# Patient Record
Sex: Male | Born: 1985 | Hispanic: No | Marital: Single | Smoking: Never smoker
Health system: Southern US, Community
[De-identification: ages and names within clinical notes are randomized; demographics above are authoritative.]

## PROBLEM LIST (undated history)

## (undated) DIAGNOSIS — D649 Anemia, unspecified: Secondary | ICD-10-CM

## (undated) DIAGNOSIS — E8770 Fluid overload, unspecified: Secondary | ICD-10-CM

## (undated) DIAGNOSIS — K7460 Unspecified cirrhosis of liver: Secondary | ICD-10-CM

## (undated) DIAGNOSIS — R0600 Dyspnea, unspecified: Secondary | ICD-10-CM

## (undated) DIAGNOSIS — E871 Hypo-osmolality and hyponatremia: Secondary | ICD-10-CM

## (undated) HISTORY — PX: PARACENTESIS: SHX844

## (undated) HISTORY — PX: NO PAST SURGERIES: SHX2092

---

## 2020-01-26 ENCOUNTER — Emergency Department: Payer: Medicaid - Out of State

## 2020-01-26 ENCOUNTER — Emergency Department
Admission: EM | Admit: 2020-01-26 | Discharge: 2020-01-26 | Disposition: A | Discharge status: Home / Self Care | Payer: Medicaid - Out of State | Attending: Emergency Medicine | Admitting: Emergency Medicine

## 2020-01-26 ENCOUNTER — Other Ambulatory Visit: Payer: Self-pay

## 2020-01-26 DIAGNOSIS — R188 Other ascites: Secondary | ICD-10-CM | POA: Diagnosis not present

## 2020-01-26 DIAGNOSIS — Z20822 Contact with and (suspected) exposure to covid-19: Secondary | ICD-10-CM | POA: Diagnosis not present

## 2020-01-26 DIAGNOSIS — R140 Abdominal distension (gaseous): Secondary | ICD-10-CM | POA: Diagnosis present

## 2020-01-26 DIAGNOSIS — K7031 Alcoholic cirrhosis of liver with ascites: Secondary | ICD-10-CM

## 2020-01-26 HISTORY — DX: Anemia, unspecified: D64.9

## 2020-01-26 HISTORY — DX: Fluid overload, unspecified: E87.70

## 2020-01-26 HISTORY — DX: Hypo-osmolality and hyponatremia: E87.1

## 2020-01-26 HISTORY — DX: Unspecified cirrhosis of liver: K74.60

## 2020-01-26 LAB — PROTIME-INR
INR: 2.2 — ABNORMAL HIGH (ref 0.8–1.2)
Prothrombin Time: 23.9 seconds — ABNORMAL HIGH (ref 11.4–15.2)

## 2020-01-26 LAB — COMPREHENSIVE METABOLIC PANEL
ALT: 62 U/L — ABNORMAL HIGH (ref 0–44)
AST: 97 U/L — ABNORMAL HIGH (ref 15–41)
Albumin: 2.6 g/dL — ABNORMAL LOW (ref 3.5–5.0)
Alkaline Phosphatase: 97 U/L (ref 38–126)
Anion gap: 10 (ref 5–15)
BUN: 18 mg/dL (ref 6–20)
CO2: 20 mmol/L — ABNORMAL LOW (ref 22–32)
Calcium: 8.5 mg/dL — ABNORMAL LOW (ref 8.9–10.3)
Chloride: 98 mmol/L (ref 98–111)
Creatinine, Ser: 0.65 mg/dL (ref 0.61–1.24)
GFR calc Af Amer: >60 mL/min (ref >60)
GFR calc non Af Amer: >60 mL/min (ref >60)
Glucose, Bld: 116 mg/dL — ABNORMAL HIGH (ref 70–99)
Potassium: 4.1 mmol/L (ref 3.5–5.1)
Sodium: 128 mmol/L — ABNORMAL LOW (ref 135–145)
Total Bilirubin: 19.2 mg/dL (ref 0.3–1.2)
Total Protein: 7.9 g/dL (ref 6.5–8.1)

## 2020-01-26 LAB — APTT: aPTT: 40 seconds — ABNORMAL HIGH (ref 24–36)

## 2020-01-26 LAB — CBC
HCT: 28.2 % — ABNORMAL LOW (ref 39.0–52.0)
Hemoglobin: 10.2 g/dL — ABNORMAL LOW (ref 13.0–17.0)
MCH: 36.2 pg — ABNORMAL HIGH (ref 26.0–34.0)
MCHC: 36.2 g/dL — ABNORMAL HIGH (ref 30.0–36.0)
MCV: 100 fL (ref 80.0–100.0)
Platelets: 129 10*3/uL — ABNORMAL LOW (ref 150–400)
RBC: 2.82 MIL/uL — ABNORMAL LOW (ref 4.22–5.81)
RDW: 14.3 % (ref 11.5–15.5)
WBC: 11.7 10*3/uL — ABNORMAL HIGH (ref 4.0–10.5)
nRBC: 0 % (ref 0.0–0.2)

## 2020-01-26 LAB — LIPASE, BLOOD: Lipase: 44 U/L (ref 11–51)

## 2020-01-26 LAB — SARS CORONAVIRUS 2 BY RT PCR (HOSPITAL ORDER, PERFORMED IN ~~LOC~~ HOSPITAL LAB): SARS Coronavirus 2: NEGATIVE

## 2020-01-26 NOTE — ED Notes (Signed)
Pt states he just needs his abd drained, states he has had to have it done twice before

## 2020-01-26 NOTE — Procedures (Signed)
Ultrasound-guided therapeutic paracentesis performed yielding 5.6 liters of straw  colored fluid.  No immediate complications. EBL is < 2 ml.

## 2020-01-26 NOTE — ED Triage Notes (Signed)
Pt comes into the ED via EMS from home with c/o abd distention with hx of liver failure. Pt is in NAD. 138/78, 82HRm 97%RA

## 2020-01-26 NOTE — ED Provider Notes (Signed)
[facility] Emergency Department Provider Note   ____________________________________________   First MD Initiated Contact with Patient 01/26/20 1417     (approximate)  I have reviewed the triage vital signs and the nursing notes.   HISTORY  Chief Complaint Bloated    HPI Bruce Bell is a 34 y.o. male who comes in complaining of increasing abdominal girth shortness of breath when he walks.  He says he is as bad as he has been when he had paracentesis done twice before.  He is asking for another paracentesis.  He has alcoholic cirrhosis.  He is not having any abdominal pain.         Past Medical History:  Diagnosis Date  . Anemia   . Hypervolemia   . Hyponatremia   . Liver cirrhosis (HCC)     There are no problems to display for this patient.   Past Surgical History:  Procedure Laterality Date  . PARACENTESIS      Prior to Admission medications   Medication Sig Start Date End Date Taking? Authorizing Provider  furosemide (LASIX) 40 MG tablet Take 40 mg by mouth.   Yes [provider]  pantoprazole (PROTONIX) 40 MG tablet Take 40 mg by mouth daily.   Yes [provider]  spironolactone (ALDACTONE) 100 MG tablet Take 100 mg by mouth daily.   Yes [provider]    Allergies Patient has no known allergies.  No family history on file.  Social History Social History   Tobacco Use  . Smoking status: Never Smoker  . Smokeless tobacco: Never Used  Substance Use Topics  . Alcohol use: Not Currently  . Drug use: Not Currently    Review of Systems  Constitutional: No fever/chills Eyes: No visual changes. ENT: No sore throat. Cardiovascular: Denies chest pain. Respiratory: Denies shortness of breath. Gastrointestinal: No abdominal pain.  No nausea, no vomiting.  No diarrhea.  No constipation. Genitourinary: Negative for dysuria. Musculoskeletal: Negative for back pain. Skin: Negative for  rash. Neurological: Negative for headaches, focal weakness  ____________________________________________   PHYSICAL EXAM:  VITAL SIGNS: ED Triage Vitals  Enc Vitals Group     BP 01/26/20 1015 127/76     Pulse Rate 01/26/20 1015 (!) 108     Resp 01/26/20 1015 (!) 22     Temp 01/26/20 1015 98.1 F (36.7 C)     Temp Source 01/26/20 1015 Oral     SpO2 01/26/20 1015 98 %     Weight 01/26/20 0952 166 lb (75.3 kg)     Height 01/26/20 0952 5\' 8"  (1.727 m)     Head Circumference --      Peak Flow --      Pain Score 01/26/20 0952 6     Pain Loc --      Pain Edu? --      Excl. in GC? --     Constitutional: Alert and oriented. Well appearing and in no acute distress. Eyes: Conjunctivae are jaundiced Head: Atraumatic. Nose: No congestion/rhinnorhea. Mouth/Throat: Mucous membranes are moist.  Oropharynx non-erythematous. Neck: No stridor.  Cardiovascular: Normal rate, regular rhythm. Grossly normal heart sounds.  Good peripheral circulation. Respiratory: Normal respiratory effort.  No retractions. Lungs CTAB. Gastrointestinal: Soft and nontender.  distention. No abdominal bruits.  Musculoskeletal: No lower extremity tenderness nor edema.   Neurologic:  Normal speech and language. No gross focal neurologic deficits are appreciated. No gait instability. Skin:  Skin is warm, dry and intact. No rash noted.  ____________________________________________   LABS (all labs ordered are listed, but only abnormal results are displayed)  Labs Reviewed  COMPREHENSIVE METABOLIC PANEL - Abnormal; Notable for the following components:      Result Value   Sodium 128 (*)    CO2 20 (*)    Glucose, Bld 116 (*)    Calcium 8.5 (*)    Albumin 2.6 (*)    AST 97 (*)    ALT 62 (*)    Total Bilirubin 19.2 (*)    All other components within normal limits  CBC - Abnormal; Notable for the following components:   WBC 11.7 (*)    RBC 2.82 (*)    Hemoglobin 10.2 (*)    HCT 28.2 (*)    MCH 36.2 (*)     MCHC 36.2 (*)    Platelets 129 (*)    All other components within normal limits  PROTIME-INR - Abnormal; Notable for the following components:   Prothrombin Time 23.9 (*)    INR 2.2 (*)    All other components within normal limits  APTT - Abnormal; Notable for the following components:   aPTT 40 (*)    All other components within normal limits  SARS CORONAVIRUS 2 BY RT PCR (HOSPITAL ORDER, PERFORMED IN [facility])  LIPASE, BLOOD  URINALYSIS, COMPLETE (UACMP) WITH MICROSCOPIC   ____________________________________________  EKG   ____________________________________________  RADIOLOGY  ED MD interpretation:  Official radiology report(s): DG Chest 2 View  Result Date: 01/26/2020 CLINICAL DATA:  Shortness of breath, abdominal distension EXAM: CHEST - 2 VIEW COMPARISON:  None. FINDINGS: Large right pleural effusion with some adjacent opacity in the lung likely passive atelectasis though underlying infection/consolidation within this lung would be difficult to exclude. Left lung is clear. Visible cardiomediastinal contours are unremarkable. No acute osseous or soft tissue abnormality. IMPRESSION: Large right pleural effusion with adjacent opacity likely reflecting passive atelectasis though underlying infection/consolidation within this lung would be difficult to exclude. Electronically Signed   By: Kreg Shropshire M.D.   On: 01/26/2020 15:03    ____________________________________________   PROCEDURES  Procedure(s) performed (including Critical Care):  Procedures   ____________________________________________   INITIAL IMPRESSION / ASSESSMENT AND PLAN / ED COURSE Patient reports he has been this bad each time he gets his paracentesis. I have ordered paracentesis for him. We are waiting on some lab work.              ____________________________________________   FINAL CLINICAL IMPRESSION(S) / ED DIAGNOSES  Final diagnoses:  Ascites of liver   Alcoholic cirrhosis of liver with ascites [facility])     ED Discharge Orders    None      *Please note:  Antinio Sanderfer was evaluated in Emergency Department on 01/26/2020 for the symptoms described in the history of present illness. He was evaluated in the context of the global COVID-19 pandemic, which necessitated consideration that the patient might be at risk for infection with the SARS-CoV-2 virus that causes COVID-19. Institutional protocols and algorithms that pertain to the evaluation of patients at risk for COVID-19 are in a state of rapid change based on information released by regulatory bodies including the CDC and federal and state organizations. These policies and algorithms were followed during the patient's care in the ED.  Some ED evaluations and interventions may be delayed as a result of limited staffing during and the pandemic.*   Note:  This document was prepared using Dragon voice recognition software and may include unintentional dictation  errors.    Arnaldo Natal, MD 01/26/20 [phone]

## 2020-01-26 NOTE — Discharge Instructions (Signed)
Please continue to take your regular medications. Return to the ED with any fevers, worsening abdominal pain or tense swelling to your abdomen.

## 2020-01-26 NOTE — ED Provider Notes (Signed)
Patient received in signout from Dr. Darnelle Catalan.  Briefly, history of alcoholic cirrhosis and recurrent paracentesis for ascites accumulation, who presents with increased ascites and request for paracentesis.  IR performs ultrasound-guided paracentesis, yielding 5.6 L of straw-colored fluid.  No immediate complications.  Patient is observed for about 1 hour after this in the ED without evidence of complications.  Patient with resolution of his symptoms and feels much better, he is thankful.  His abdominal exam is benign.  Patient suitable for discharge and outpatient follow-up.  We discussed return precautions for the ED.  Patient medically stable for discharge home.   Delton Prairie, MD 01/26/20 [phone]

## 2020-02-07 ENCOUNTER — Other Ambulatory Visit: Payer: Self-pay

## 2020-02-07 ENCOUNTER — Emergency Department
Admission: EM | Admit: 2020-02-07 | Discharge: 2020-02-07 | Disposition: A | Discharge status: Home / Self Care | Payer: Medicaid - Out of State | Attending: Emergency Medicine | Admitting: Emergency Medicine

## 2020-02-07 ENCOUNTER — Emergency Department: Payer: Medicaid - Out of State

## 2020-02-07 ENCOUNTER — Encounter: Payer: Self-pay | Admitting: Emergency Medicine

## 2020-02-07 DIAGNOSIS — R109 Unspecified abdominal pain: Secondary | ICD-10-CM | POA: Insufficient documentation

## 2020-02-07 DIAGNOSIS — Z5321 Procedure and treatment not carried out due to patient leaving prior to being seen by health care provider: Secondary | ICD-10-CM | POA: Insufficient documentation

## 2020-02-07 DIAGNOSIS — R0602 Shortness of breath: Secondary | ICD-10-CM | POA: Insufficient documentation

## 2020-02-07 DIAGNOSIS — R000 Tachycardia, unspecified: Secondary | ICD-10-CM | POA: Insufficient documentation

## 2020-02-07 LAB — COMPREHENSIVE METABOLIC PANEL
ALT: 50 U/L — ABNORMAL HIGH (ref 0–44)
AST: 79 U/L — ABNORMAL HIGH (ref 15–41)
Albumin: 2.2 g/dL — ABNORMAL LOW (ref 3.5–5.0)
Alkaline Phosphatase: 210 U/L — ABNORMAL HIGH (ref 38–126)
Anion gap: 8 (ref 5–15)
BUN: 16 mg/dL (ref 6–20)
CO2: 19 mmol/L — ABNORMAL LOW (ref 22–32)
Calcium: 8.5 mg/dL — ABNORMAL LOW (ref 8.9–10.3)
Chloride: 101 mmol/L (ref 98–111)
Creatinine, Ser: 0.66 mg/dL (ref 0.61–1.24)
GFR calc Af Amer: >60 mL/min (ref >60)
GFR calc non Af Amer: >60 mL/min (ref >60)
Glucose, Bld: 128 mg/dL — ABNORMAL HIGH (ref 70–99)
Potassium: 4.6 mmol/L (ref 3.5–5.1)
Sodium: 128 mmol/L — ABNORMAL LOW (ref 135–145)
Total Bilirubin: 14 mg/dL — ABNORMAL HIGH (ref 0.3–1.2)
Total Protein: 7.4 g/dL (ref 6.5–8.1)

## 2020-02-07 LAB — CBC
HCT: 25.2 % — ABNORMAL LOW (ref 39.0–52.0)
Hemoglobin: 8.4 g/dL — ABNORMAL LOW (ref 13.0–17.0)
MCH: 36.1 pg — ABNORMAL HIGH (ref 26.0–34.0)
MCHC: 33.3 g/dL (ref 30.0–36.0)
MCV: 108.2 fL — ABNORMAL HIGH (ref 80.0–100.0)
Platelets: 174 10*3/uL (ref 150–400)
RBC: 2.33 MIL/uL — ABNORMAL LOW (ref 4.22–5.81)
RDW: 16 % — ABNORMAL HIGH (ref 11.5–15.5)
WBC: 10 10*3/uL (ref 4.0–10.5)
nRBC: 0 % (ref 0.0–0.2)

## 2020-02-07 LAB — LIPASE, BLOOD: Lipase: 80 U/L — ABNORMAL HIGH (ref 11–51)

## 2020-02-07 LAB — PROTIME-INR
INR: 2.2 — ABNORMAL HIGH (ref 0.8–1.2)
Prothrombin Time: 23.5 seconds — ABNORMAL HIGH (ref 11.4–15.2)

## 2020-02-07 NOTE — ED Triage Notes (Signed)
Pt states last Nov dx with fatty liver that developed into  Stage 1 cirrhosis . Pt states has had several paracentesis done. Pt states had restricted fluid intake to 1L/day. C/o SOB, noted distended abdomen in triage.   Pt states last paracentesis approx 3 weeks ago. Pt noted to be tachypneic and tachycardic in triage.

## 2020-02-08 ENCOUNTER — Emergency Department
Admission: EM | Admit: 2020-02-08 | Discharge: 2020-02-08 | Disposition: A | Discharge status: Home / Self Care | Payer: Medicaid - Out of State | Attending: Student in an Organized Health Care Education/Training Program | Admitting: Student in an Organized Health Care Education/Training Program

## 2020-02-08 ENCOUNTER — Emergency Department: Payer: Medicaid - Out of State

## 2020-02-08 ENCOUNTER — Encounter: Payer: Self-pay | Admitting: Emergency Medicine

## 2020-02-08 DIAGNOSIS — R109 Unspecified abdominal pain: Secondary | ICD-10-CM | POA: Diagnosis present

## 2020-02-08 DIAGNOSIS — K7031 Alcoholic cirrhosis of liver with ascites: Secondary | ICD-10-CM | POA: Insufficient documentation

## 2020-02-08 HISTORY — DX: Unspecified cirrhosis of liver: K74.60

## 2020-02-08 LAB — ALBUMIN, PLEURAL OR PERITONEAL FLUID: Albumin, Fluid: <1 g/dL

## 2020-02-08 LAB — BODY FLUID CELL COUNT WITH DIFFERENTIAL
Eos, Fluid: 0 %
Lymphs, Fluid: 36 %
Monocyte-Macrophage-Serous Fluid: 41 %
Neutrophil Count, Fluid: 23 %
Total Nucleated Cell Count, Fluid: 374 cu mm

## 2020-02-08 LAB — PATHOLOGIST SMEAR REVIEW

## 2020-02-08 MED ORDER — ALBUMIN HUMAN 25 % IV SOLN
12.5000 g | Freq: Once | INTRAVENOUS | Status: AC
  Administered 2020-02-08: 12.5 g via INTRAVENOUS
  Filled 2020-02-08: qty 50

## 2020-02-08 NOTE — ED Notes (Signed)
After speaking with SW and having lunch, pt has been discharged

## 2020-02-08 NOTE — ED Triage Notes (Signed)
Pt arrived to ED from home with c/o pressure in his abd. Pt has Stage 1 cirrhosis and states his abd needs to be drained. Pt was in ED earlier for the same but states he was tired and hungry and decided to leave and return later.

## 2020-02-08 NOTE — TOC Initial Note (Signed)
Transition of Care [facility]) - Initial/Assessment Note    Patient Details  Name: Gale Hulse MRN: [id number] Date of Birth: 29-Apr-1986  Transition of Care [facility]) CM/SW Contact:    Marina Goodell Phone Number: [phone] 02/08/2020, 3:29 PM  Clinical Narrative:     Pt arrived to ED from home with c/o pressure in his abd. Pt has Stage 1 cirrhosis and states his abd needs to be drained.  CWS spoke with patient about pending insurance.  Patient stated he is waiting for Medicaid approval in [place], recently moved from [place].  Patient stated his new [employer] plan begins tomorrow 02/09/2020.  CSW gave patient local resource list and pointed out local clinics which will take Medicaid insurance.  CSW asked patient if he had any other question or needs and he stated he was very hungry and had not eaten since last night.  CSW asked RN about patient's food tray and she stated it had been ordered but had not arrived yet. CSW asked the patient if he would like to wait for the food tray and he sated "yes".  CSW asked if the patient would like snacks and he stated "yes".  CSW went to get patient snacks.  CSW asked if he had any other needs or questions, patient stated he did not. TOC consult complete.   Barriers to Discharge: ED Medication assistance, ED Uninsured needing PCP establishment, ED Uninsured needing medication assistance   Patient Goals and CMS Choice Patient states their goals for this hospitalization and ongoing recovery are:: Find PCP      Expected Discharge Plan and Services                                                Prior Living Arrangements/Services                       Activities of Daily Living      Permission Sought/Granted                  Emotional Assessment              Admission diagnosis:  abd pain There are no problems to display for this patient.  PCP:  Patient, No Pcp Per Pharmacy:  No Pharmacies Listed    Social  Determinants of Health (SDOH) Interventions    Readmission Risk Interventions No flowsheet data found.

## 2020-02-08 NOTE — Procedures (Signed)
PROCEDURE SUMMARY:  Successful US guided paracentesis from right lateral abdomen.  Yielded 4.7 liters of clear yellow fluid.  No immediate complications.  Patient tolerated well.  EBL = trace  Specimen was sent for labs.  Darcy Barbara S Khylan Sawyer PA-C 02/08/2020 11:29 AM

## 2020-02-08 NOTE — Discharge Instructions (Addendum)
Continue to take medications as prescribed.  Please establish care with PCP and GI specialist.

## 2020-02-08 NOTE — ED Notes (Signed)
Pt taken to US

## 2020-02-08 NOTE — ED Notes (Signed)
AAOx3.  Skin warm and dry. No SOB/ DOE.  Resting in bed.  Asking for lunch.  NAD

## 2020-02-08 NOTE — ED Provider Notes (Signed)
[facility] Emergency Department Provider Note    First MD Initiated Contact with Patient 02/08/20 [phone]     (approximate)  I have reviewed the triage vital signs and the nursing notes.   HISTORY  Chief Complaint Abdominal Pain    HPI Bruce Bell is a 34 y.o. male with the below listed past medical history recently moved from [place] presents to the ER for fairly quickly recurrent abdominal ascites and swelling.  He is not particularly endorsing any pain but feels like this did recur more quickly after recently having therapeutic paracentesis done last week.  States he did miss a few days of his medications including diuretics but has gotten those refills submitted he is currently taking his medications.  He is not drinking alcohol.  No fevers.  States he is on prophylactic antibiotics.  Denies any bleeding or melena.  No hematochezia.  States he needs a referral to PCP and establish care with GI.    Past Medical History:  Diagnosis Date  . Anemia   . Cirrhosis (HCC)   . Hypervolemia   . Hyponatremia   . Liver cirrhosis (HCC)    History reviewed. No pertinent family history. Past Surgical History:  Procedure Laterality Date  . PARACENTESIS     There are no problems to display for this patient.     Prior to Admission medications   Medication Sig Start Date End Date Taking? Authorizing Provider  furosemide (LASIX) 40 MG tablet Take 40 mg by mouth.    [provider]  pantoprazole (PROTONIX) 40 MG tablet Take 40 mg by mouth daily.    [provider]  spironolactone (ALDACTONE) 100 MG tablet Take 100 mg by mouth daily.    [provider]    Allergies Patient has no known allergies.    Social History Social History   Tobacco Use  . Smoking status: Never Smoker  . Smokeless tobacco: Never Used  Vaping Use  . Vaping Use: Never used  Substance Use Topics  . Alcohol use: Not Currently  . Drug use: Not Currently      Review of Systems Patient denies headaches, rhinorrhea, blurry vision, numbness, shortness of breath, chest pain, edema, cough, abdominal pain, nausea, vomiting, diarrhea, dysuria, fevers, rashes or hallucinations unless otherwise stated above in HPI. ____________________________________________   PHYSICAL EXAM:  VITAL SIGNS: Vitals:   02/08/20 1035 02/08/20 1219  BP: (!) 120/48 (!) 115/58  Pulse: (!) 111 (!) 108  Resp: 20   Temp:    SpO2: 98% 100%    Constitutional: Alert and oriented.  Eyes: Conjunctivae are normal. Scleral icterus Head: Atraumatic. Nose: No congestion/rhinnorhea. Mouth/Throat: Mucous membranes are moist.   Neck: No stridor. Painless ROM.  Cardiovascular: Normal rate, regular rhythm. Grossly normal heart sounds.  Good peripheral circulation. Respiratory: Normal respiratory effort.  No retractions. Lungs CTAB. Gastrointestinal: Soft and nontender. + distension with fluid wave. No abdominal bruits. No CVA tenderness. Genitourinary:  Musculoskeletal: No lower extremity tenderness nor edema.  No joint effusions. Neurologic:  Normal speech and language. No gross focal neurologic deficits are appreciated. No facial droop Skin:  Skin is warm, dry and intact. No rash noted. Psychiatric: Mood and affect are normal. Speech and behavior are normal.  ____________________________________________   LABS (all labs ordered are listed, but only abnormal results are displayed)  Results for orders placed or performed during the hospital encounter of 02/08/20 (from the past 24 hour(s))  Body fluid cell count with differential  Status: Abnormal   Collection Time: 02/08/20 10:20 AM  Result Value Ref Range   Fluid Type-FCT PERITONEAL    Color, Fluid YELLOW (A) YELLOW   Appearance, Fluid HAZY (A) CLEAR   Total Nucleated Cell Count, Fluid 374 cu mm   Neutrophil Count, Fluid 23 %   Lymphs, Fluid 36 %   Monocyte-Macrophage-Serous Fluid 41 %   Eos, Fluid 0 %  Albumin,  pleural or peritoneal fluid     Status: None   Collection Time: 02/08/20 10:20 AM  Result Value Ref Range   Albumin, Fluid <1.0 g/dL   Fluid Type-FALB CYTO PERI   Pathologist smear review     Status: None   Collection Time: 02/08/20 10:20 AM  Result Value Ref Range   Path Review Cytospin of abdominal fluid is reviewed.    ____________________________________________  EKG My review and personal interpretation at Time: 12:14   Indication: abd distension  Rate: 125  Rhythm: sinus Axis: normal Other: motion artifact, abnml ekg ____________________________________________  RADIOLOGY  I personally reviewed all radiographic images ordered to evaluate for the above acute complaints and reviewed radiology reports and findings.  These findings were personally discussed with the patient.  Please see medical record for radiology report.  ____________________________________________   PROCEDURES  Procedure(s) performed:  Procedures    Critical Care performed: no ____________________________________________   INITIAL IMPRESSION / ASSESSMENT AND PLAN / ED COURSE  Pertinent labs & imaging results that were available during my care of the patient were reviewed by me and considered in my medical decision making (see chart for details).   DDX: Recurrent ascites, decompensated cirrhosis, SBP, medication noncompliance   Bruce Bell is a 34 y.o. who presents to the ED with presentation as described above he is afebrile nontoxic-appearing exam is described above.  Does have significant distention of the abdomen given its recurrence raise possibility of SBP given the patient's not febrile no significant white count as compared to previous.  Blood work actually appears improved from previous.  Will order diagnostic and therapeutic paracentesis.  Will consult social work as the patient is to be established with a local provider.  Clinical Course as of Feb 07 1450  Thu Feb 08, 2020  1129 Patient  returned back from paracentesis nontoxic-appearing the put off over 4 L therefore will give dose of albumin.   [PR]  1310 Cell count is reassuring not consistent with SBP.  Blood work appears stable.  Referral made for social worker to help arrange outpatient follow-up.  No indication for hospitalization at this time.   [PR]    Clinical Course User Index [PR] Willy Eddy, MD    The patient was evaluated in Emergency Department today for the symptoms described in the history of present illness. He/she was evaluated in the context of the global COVID-19 pandemic, which necessitated consideration that the patient might be at risk for infection with the SARS-CoV-2 virus that causes COVID-19. Institutional protocols and algorithms that pertain to the evaluation of patients at risk for COVID-19 are in a state of rapid change based on information released by regulatory bodies including the CDC and federal and state organizations. These policies and algorithms were followed during the patient's care in the ED.  As part of my medical decision making, I reviewed the following data within the electronic MEDICAL RECORD NUMBER Nursing notes reviewed and incorporated, Labs reviewed, notes from prior ED visits and [place] Controlled Substance Database   ____________________________________________   FINAL CLINICAL IMPRESSION(S) / ED DIAGNOSES  Final diagnoses:  Ascites due to alcoholic cirrhosis (HCC)      NEW MEDICATIONS STARTED DURING THIS VISIT:  Discharge Medication List as of 02/08/2020  1:27 PM       Note:  This document was prepared using Dragon voice recognition software and may include unintentional dictation errors.    Willy Eddy, MD 02/08/20 1451

## 2020-02-08 NOTE — ED Notes (Signed)
Pt returned from U/S via stretcher. 

## 2020-02-12 LAB — BODY FLUID CULTURE: Culture: NO GROWTH

## 2020-02-13 LAB — BODY FLUID CULTURE: Culture: NO GROWTH

## 2020-02-19 ENCOUNTER — Encounter (HOSPITAL_BASED_OUTPATIENT_CLINIC_OR_DEPARTMENT_OTHER): Payer: Self-pay

## 2020-02-19 ENCOUNTER — Inpatient Hospital Stay (HOSPITAL_BASED_OUTPATIENT_CLINIC_OR_DEPARTMENT_OTHER)
Admission: EM | Admit: 2020-02-19 | Discharge: 2020-02-21 | DRG: 432 | Disposition: A | Discharge status: Home / Self Care | Payer: Managed Care, Other (non HMO) | Attending: Internal Medicine | Admitting: Internal Medicine

## 2020-02-19 ENCOUNTER — Encounter: Payer: Self-pay | Admitting: *Deleted

## 2020-02-19 ENCOUNTER — Ambulatory Visit: Payer: Self-pay

## 2020-02-19 ENCOUNTER — Other Ambulatory Visit: Payer: Self-pay

## 2020-02-19 ENCOUNTER — Ambulatory Visit
Admission: EM | Admit: 2020-02-19 | Discharge: 2020-02-19 | Disposition: A | Discharge status: Home / Self Care | Payer: Managed Care, Other (non HMO)

## 2020-02-19 ENCOUNTER — Ambulatory Visit (HOSPITAL_COMMUNITY): Payer: Self-pay

## 2020-02-19 DIAGNOSIS — Z8379 Family history of other diseases of the digestive system: Secondary | ICD-10-CM

## 2020-02-19 DIAGNOSIS — F1021 Alcohol dependence, in remission: Secondary | ICD-10-CM | POA: Diagnosis present

## 2020-02-19 DIAGNOSIS — R0602 Shortness of breath: Secondary | ICD-10-CM

## 2020-02-19 DIAGNOSIS — E871 Hypo-osmolality and hyponatremia: Secondary | ICD-10-CM | POA: Diagnosis not present

## 2020-02-19 DIAGNOSIS — D6959 Other secondary thrombocytopenia: Secondary | ICD-10-CM | POA: Diagnosis present

## 2020-02-19 DIAGNOSIS — K7031 Alcoholic cirrhosis of liver with ascites: Secondary | ICD-10-CM | POA: Diagnosis not present

## 2020-02-19 DIAGNOSIS — R188 Other ascites: Secondary | ICD-10-CM

## 2020-02-19 DIAGNOSIS — R110 Nausea: Secondary | ICD-10-CM

## 2020-02-19 DIAGNOSIS — E8770 Fluid overload, unspecified: Secondary | ICD-10-CM | POA: Diagnosis present

## 2020-02-19 DIAGNOSIS — D689 Coagulation defect, unspecified: Secondary | ICD-10-CM | POA: Diagnosis present

## 2020-02-19 DIAGNOSIS — K7030 Alcoholic cirrhosis of liver without ascites: Secondary | ICD-10-CM

## 2020-02-19 DIAGNOSIS — K7460 Unspecified cirrhosis of liver: Secondary | ICD-10-CM | POA: Diagnosis present

## 2020-02-19 DIAGNOSIS — R5383 Other fatigue: Secondary | ICD-10-CM

## 2020-02-19 DIAGNOSIS — R17 Unspecified jaundice: Secondary | ICD-10-CM

## 2020-02-19 DIAGNOSIS — K7290 Hepatic failure, unspecified without coma: Secondary | ICD-10-CM | POA: Diagnosis present

## 2020-02-19 DIAGNOSIS — I9581 Postprocedural hypotension: Secondary | ICD-10-CM | POA: Diagnosis not present

## 2020-02-19 DIAGNOSIS — Z79899 Other long term (current) drug therapy: Secondary | ICD-10-CM

## 2020-02-19 DIAGNOSIS — R42 Dizziness and giddiness: Secondary | ICD-10-CM | POA: Diagnosis not present

## 2020-02-19 DIAGNOSIS — D684 Acquired coagulation factor deficiency: Secondary | ICD-10-CM | POA: Diagnosis present

## 2020-02-19 DIAGNOSIS — Z23 Encounter for immunization: Secondary | ICD-10-CM

## 2020-02-19 DIAGNOSIS — D649 Anemia, unspecified: Secondary | ICD-10-CM | POA: Diagnosis present

## 2020-02-19 DIAGNOSIS — Z20822 Contact with and (suspected) exposure to covid-19: Secondary | ICD-10-CM | POA: Diagnosis present

## 2020-02-19 DIAGNOSIS — K7200 Acute and subacute hepatic failure without coma: Secondary | ICD-10-CM | POA: Diagnosis present

## 2020-02-19 DIAGNOSIS — J90 Pleural effusion, not elsewhere classified: Secondary | ICD-10-CM | POA: Diagnosis present

## 2020-02-19 DIAGNOSIS — R140 Abdominal distension (gaseous): Secondary | ICD-10-CM | POA: Diagnosis present

## 2020-02-19 DIAGNOSIS — K7469 Other cirrhosis of liver: Secondary | ICD-10-CM | POA: Diagnosis present

## 2020-02-19 LAB — COMPREHENSIVE METABOLIC PANEL
ALT: 55 U/L — ABNORMAL HIGH (ref 0–44)
AST: 90 U/L — ABNORMAL HIGH (ref 15–41)
Albumin: 2.3 g/dL — ABNORMAL LOW (ref 3.5–5.0)
Alkaline Phosphatase: 148 U/L — ABNORMAL HIGH (ref 38–126)
Anion gap: 8 (ref 5–15)
BUN: 37 mg/dL — ABNORMAL HIGH (ref 6–20)
CO2: 18 mmol/L — ABNORMAL LOW (ref 22–32)
Calcium: 8.2 mg/dL — ABNORMAL LOW (ref 8.9–10.3)
Chloride: 96 mmol/L — ABNORMAL LOW (ref 98–111)
Creatinine, Ser: 1.21 mg/dL (ref 0.61–1.24)
GFR, Estimated: >60 mL/min (ref >60)
Glucose, Bld: 124 mg/dL — ABNORMAL HIGH (ref 70–99)
Potassium: 4.6 mmol/L (ref 3.5–5.1)
Sodium: 122 mmol/L — ABNORMAL LOW (ref 135–145)
Total Bilirubin: 12.6 mg/dL — ABNORMAL HIGH (ref 0.3–1.2)
Total Protein: 7 g/dL (ref 6.5–8.1)

## 2020-02-19 LAB — URINALYSIS, ROUTINE W REFLEX MICROSCOPIC
Glucose, UA: NEGATIVE mg/dL
Ketones, ur: NEGATIVE mg/dL
Nitrite: NEGATIVE
Protein, ur: NEGATIVE mg/dL
Specific Gravity, Urine: 1.02 (ref 1.005–1.030)
pH: 5.5 (ref 5.0–8.0)

## 2020-02-19 LAB — URINALYSIS, MICROSCOPIC (REFLEX): WBC, UA: >50 WBC/hpf (ref 0–5)

## 2020-02-19 LAB — CBC
HCT: 25.8 % — ABNORMAL LOW (ref 39.0–52.0)
Hemoglobin: 8.6 g/dL — ABNORMAL LOW (ref 13.0–17.0)
MCH: 35.7 pg — ABNORMAL HIGH (ref 26.0–34.0)
MCHC: 33.3 g/dL (ref 30.0–36.0)
MCV: 107.1 fL — ABNORMAL HIGH (ref 80.0–100.0)
Platelets: 130 10*3/uL — ABNORMAL LOW (ref 150–400)
RBC: 2.41 MIL/uL — ABNORMAL LOW (ref 4.22–5.81)
RDW: 14.8 % (ref 11.5–15.5)
WBC: 8 10*3/uL (ref 4.0–10.5)
nRBC: 0 % (ref 0.0–0.2)

## 2020-02-19 LAB — RESPIRATORY PANEL BY RT PCR (FLU A&B, COVID)
Influenza A by PCR: NEGATIVE
Influenza B by PCR: NEGATIVE
SARS Coronavirus 2 by RT PCR: NEGATIVE

## 2020-02-19 LAB — LIPASE, BLOOD: Lipase: 76 U/L — ABNORMAL HIGH (ref 11–51)

## 2020-02-19 LAB — PROTIME-INR
INR: 2.2 — ABNORMAL HIGH (ref 0.8–1.2)
Prothrombin Time: 23.8 seconds — ABNORMAL HIGH (ref 11.4–15.2)

## 2020-02-19 MED ORDER — FENTANYL CITRATE (PF) 100 MCG/2ML IJ SOLN
50.0000 ug | Freq: Once | INTRAMUSCULAR | Status: AC
  Administered 2020-02-19: 50 ug via INTRAVENOUS
  Filled 2020-02-19: qty 2

## 2020-02-19 MED ORDER — CIPROFLOXACIN HCL 500 MG PO TABS: 500.0000 mg | ORAL_TABLET | Freq: Every day | ORAL | Status: DC

## 2020-02-19 MED ORDER — FUROSEMIDE 40 MG PO TABS: 40.0000 mg | ORAL_TABLET | Freq: Every day | ORAL | Status: DC

## 2020-02-19 MED ORDER — PANTOPRAZOLE SODIUM 40 MG PO TBEC
40.0000 mg | DELAYED_RELEASE_TABLET | Freq: Every day | ORAL | Status: DC
  Administered 2020-02-20 – 2020-02-21 (×2): 40 mg via ORAL
  Filled 2020-02-19 (×2): qty 1

## 2020-02-19 MED ORDER — SPIRONOLACTONE 25 MG PO TABS: 100.0000 mg | ORAL_TABLET | Freq: Every day | ORAL | Status: DC

## 2020-02-19 NOTE — ED Triage Notes (Addendum)
Patient reports nausea and dizziness started this am--states this caused him to fall.  States that he feels fatigued and weak.  Has history of liver cirrhosis. States that upper abdomen hurts. Liver enzymes were last checked 2 weeks ago, states they were improving--paracentesis 4L removed due to shortness of breath at ED. When asked patient states he is feeling mildly short of breath but does not feel as bad as he did 2 weeks ago. Denies abdominal pain but states bloated discomfort.

## 2020-02-19 NOTE — ED Provider Notes (Signed)
[facility] EMERGENCY DEPARTMENT Provider Note   CSN: [id number] Arrival date & time: 02/19/20  1621     History Chief Complaint  Patient presents with  . Abdominal Pain  . Shortness of Breath    Bruce Bell is a 34 y.o. male.  Patient with history of cirrhosis who presents to the ED with abdominal distention, intermittent shortness of breath.  Patient recently moved here about a month ago.  He was established in [place] with GI, transplant, primary care.  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  He is in the process of transferring his care locally but has been having issues with volume status recently.  Last paracentesis was about 2 weeks ago.  Patient not sure if he needs more fluid taken off.  Does not have any local GI or primary care yet.  He denies any fever or chills.  No nausea, no vomiting.  The history is provided by the patient.  Illness Severity:  Mild Onset quality:  Gradual Timing:  Constant Progression:  Worsening Chronicity:  Chronic Associated symptoms: shortness of breath   Associated symptoms: no abdominal pain, no chest pain, no cough, no ear pain, no fever, no rash, no sore throat and no vomiting        Past Medical History:  Diagnosis Date  . Anemia   . Cirrhosis (HCC)   . Hypervolemia   . Hyponatremia   . Liver cirrhosis (HCC)     There are no problems to display for this patient.   Past Surgical History:  Procedure Laterality Date  . PARACENTESIS         No family history on file.  Social History   Tobacco Use  . Smoking status: Never Smoker  . Smokeless tobacco: Never Used  Vaping Use  . Vaping Use: Never used  Substance Use Topics  . Alcohol use: Not Currently  . Drug use: Not Currently    Home Medications Prior to Admission medications   Medication Sig Start Date End Date Taking? Authorizing Provider  ciprofloxacin (CIPRO) 500 MG tablet Take 500 mg by mouth daily.    [provider]  furosemide  (LASIX) 40 MG tablet Take 40 mg by mouth.    [provider]  pantoprazole (PROTONIX) 40 MG tablet Take 40 mg by mouth daily.    [provider]  spironolactone (ALDACTONE) 100 MG tablet Take 100 mg by mouth daily.    [provider]    Allergies    Patient has no known allergies.  Review of Systems   Review of Systems  Constitutional: Negative for chills and fever.  HENT: Negative for ear pain and sore throat.   Eyes: Negative for pain and visual disturbance.  Respiratory: Positive for shortness of breath. Negative for cough.   Cardiovascular: Negative for chest pain and palpitations.  Gastrointestinal: Positive for abdominal distention. Negative for abdominal pain and vomiting.  Genitourinary: Negative for dysuria and hematuria.  Musculoskeletal: Negative for arthralgias and back pain.  Skin: Negative for color change and rash.  Neurological: Negative for seizures and syncope.  All other systems reviewed and are negative.   Physical Exam Updated Vital Signs  ED Triage Vitals  Enc Vitals Group     BP 02/19/20 1626 127/68     Pulse Rate 02/19/20 1626 (!) 110     Resp 02/19/20 1626 18     Temp 02/19/20 1626 98.5 F (36.9 C)     Temp Source 02/19/20 1626 Oral  SpO2 02/19/20 1626 95 %     Weight --      Height --      Head Circumference --      Peak Flow --      Pain Score 02/19/20 1628 0     Pain Loc --      Pain Edu? --      Excl. in GC? --     Physical Exam Vitals and nursing note reviewed.  Constitutional:      General: He is not in acute distress.    Appearance: He is well-developed. He is not ill-appearing.  HENT:     Head: Normocephalic and atraumatic.     Mouth/Throat:     Mouth: Mucous membranes are moist.  Eyes:     Extraocular Movements: Extraocular movements intact.     Conjunctiva/sclera: Conjunctivae normal.  Cardiovascular:     Rate and Rhythm: Normal rate and regular rhythm.     Heart sounds: Normal heart sounds. No  murmur heard.   Pulmonary:     Effort: Pulmonary effort is normal. No respiratory distress.     Breath sounds: Normal breath sounds.  Abdominal:     General: Bowel sounds are normal. There is distension.     Palpations: Abdomen is soft.     Tenderness: There is no abdominal tenderness.  Musculoskeletal:     Cervical back: Neck supple.  Skin:    General: Skin is warm and dry.     Capillary Refill: Capillary refill takes less than 2 seconds.  Neurological:     General: No focal deficit present.     Mental Status: He is alert.  Psychiatric:        Mood and Affect: Mood normal.     ED Results / Procedures / Treatments   Labs (all labs ordered are listed, but only abnormal results are displayed) Labs Reviewed  LIPASE, BLOOD - Abnormal; Notable for the following components:      Result Value   Lipase 76 (*)    All other components within normal limits  COMPREHENSIVE METABOLIC PANEL - Abnormal; Notable for the following components:   Sodium 122 (*)    Chloride 96 (*)    CO2 18 (*)    Glucose, Bld 124 (*)    BUN 37 (*)    Calcium 8.2 (*)    Albumin 2.3 (*)    AST 90 (*)    ALT 55 (*)    Alkaline Phosphatase 148 (*)    Total Bilirubin 12.6 (*)    All other components within normal limits  CBC - Abnormal; Notable for the following components:   RBC 2.41 (*)    Hemoglobin 8.6 (*)    HCT 25.8 (*)    MCV 107.1 (*)    MCH 35.7 (*)    Platelets 130 (*)    All other components within normal limits  URINALYSIS, ROUTINE W REFLEX MICROSCOPIC - Abnormal; Notable for the following components:   Color, Urine AMBER (*)    Hgb urine dipstick MODERATE (*)    Bilirubin Urine SMALL (*)    Leukocytes,Ua TRACE (*)    All other components within normal limits  URINALYSIS, MICROSCOPIC (REFLEX) - Abnormal; Notable for the following components:   Bacteria, UA FEW (*)    All other components within normal limits  RESPIRATORY PANEL BY RT PCR (FLU A&B, COVID)  PROTIME-INR     EKG None  Radiology No results found.  Procedures .Critical Care Performed by: Virgina Norfolk,  DO Authorized by: Virgina Norfolk, DO   Critical care provider statement:    Critical care time (minutes):  35   Critical care was necessary to treat or prevent imminent or life-threatening deterioration of the following conditions: hyponatremia.   Critical care was time spent personally by me on the following activities:  Blood draw for specimens, development of treatment plan with patient or surrogate, discussions with primary provider, evaluation of patient's response to treatment, examination of patient, obtaining history from patient or surrogate, ordering and performing treatments and interventions, ordering and review of radiographic studies, ordering and review of laboratory studies, pulse oximetry, review of old charts and re-evaluation of patient's condition   I assumed direction of critical care for this patient from another provider in my specialty: no     (including critical care time)  Medications Ordered in ED Medications - No data to display  ED Course  I have reviewed the triage vital signs and the nursing notes.  Pertinent labs & imaging results that were available during my care of the patient were reviewed by me and considered in my medical decision making (see chart for details).    MDM Rules/Calculators/A&P                          Garison Genova is a 34 year old male with history of alcoholic cirrhosis who presents the ED with abdominal distention.  Patient feeling shortness of breath as well.  No fever, overall normal vitals.  Quick bedside ultrasound shows ascites but not overly impressive for need for emergent large-volume paracentesis.  However, patient had lab work that showed hyponatremia to 122.  Sodium several weeks ago was 128 and upon chart review sodium is normal in the 130s.  He is on spironolactone, Lasix.  He tries to keep close tabs on his volume  intake.  He has been sober for about a month.  Patient follows in [place] for these issues with GI and transplant team however he recently moved here for job.  He had a paracentesis 2 weeks ago.  He does not have a fever or white count.  No concern for SBP.  Overall hyponatremia likely secondary to his chronic liver cirrhosis.  However will admit for observation to make sure hyponatremia does not get worse.  This will likely allow him to get plugged in with our local GI and likely be scheduled for more outpatient paracentesis as he will need them in the future.  Patient overall hemodynamically stable throughout my care.  To be admitted for further care.  Liver enzymes and hepatobiliary enzymes overall at baseline.  This chart was dictated using voice recognition software.  Despite best efforts to proofread,  errors can occur which can change the documentation meaning.    Final Clinical Impression(s) / ED Diagnoses Final diagnoses:  Hyponatremia  Hepatic cirrhosis, unspecified hepatic cirrhosis type, unspecified whether ascites present (HCC)  Acute liver failure without hepatic coma    Rx / DC Orders ED Discharge Orders    None       Virgina Norfolk, DO 02/19/20 1845

## 2020-02-19 NOTE — ED Provider Notes (Signed)
Renaldo Fiddler    CSN: [id number] Arrival date & time: 02/19/20  1133      History   Chief Complaint Chief Complaint  Patient presents with  . Nausea  . Dizziness    HPI Bruce Bell is a 34 y.o. male.   Reports that he has been experiencing nausea, dizziness, and fatigue.  Reports that this caused him to fall this morning.  Reports he still feeling fatigued and weak.  Has history of cirrhosis.  Reports that he did have 4 L of fluid removed from his abdomen due to shortness of breath at the ER 2 weeks ago.  Reports shortness of breath as well.  Reports that his abdomen is hard today and it was not yesterday.  Reports that he suspects he is accumulating fluid again.  Denies headache, cough, vomiting, diarrhea, rash, fever, other symptoms.  ROS per HPI   Dizziness   Past Medical History:  Diagnosis Date  . Anemia   . Cirrhosis (HCC)   . Hypervolemia   . Hyponatremia   . Liver cirrhosis (HCC)     There are no problems to display for this patient.   Past Surgical History:  Procedure Laterality Date  . PARACENTESIS         Home Medications    Prior to Admission medications   Medication Sig Start Date End Date Taking? Authorizing Provider  ciprofloxacin (CIPRO) 500 MG tablet Take 500 mg by mouth daily.   Yes [provider]  furosemide (LASIX) 40 MG tablet Take 40 mg by mouth.    [provider]  pantoprazole (PROTONIX) 40 MG tablet Take 40 mg by mouth daily.    [provider]  spironolactone (ALDACTONE) 100 MG tablet Take 100 mg by mouth daily.    [provider]    Family History History reviewed. No pertinent family history.  Social History Social History   Tobacco Use  . Smoking status: Never Smoker  . Smokeless tobacco: Never Used  Vaping Use  . Vaping Use: Never used  Substance Use Topics  . Alcohol use: Not Currently  . Drug use: Not Currently     Allergies   Patient has no known  allergies.   Review of Systems Review of Systems  Neurological: Positive for dizziness.     Physical Exam Triage Vital Signs ED Triage Vitals  Enc Vitals Group     BP 02/19/20 1241 121/72     Pulse Rate 02/19/20 1241 96     Resp --      Temp 02/19/20 1241 97.7 F (36.5 C)     Temp Source 02/19/20 1241 Oral     SpO2 02/19/20 1241 94 %     Weight --      Height --      Head Circumference --      Peak Flow --      Pain Score 02/19/20 1237 0     Pain Loc --      Pain Edu? --      Excl. in GC? --    No data found.  Updated Vital Signs BP 121/72 (BP Location: Left Arm)   Pulse 96   Temp 97.7 F (36.5 C) (Oral)   SpO2 94%      Physical Exam Vitals and nursing note reviewed.  Constitutional:      General: He is not in acute distress.    Appearance: He is well-developed. He is ill-appearing.  HENT:     Head: Normocephalic  and atraumatic.  Eyes:     Comments: Bilateral scleral icterus   Cardiovascular:     Rate and Rhythm: Normal rate and regular rhythm.     Heart sounds: Normal heart sounds. No murmur heard.   Pulmonary:     Effort: Pulmonary effort is normal. No respiratory distress.     Breath sounds: Examination of the right-middle field reveals decreased breath sounds. Examination of the right-lower field reveals decreased breath sounds. Decreased breath sounds present.  Abdominal:     General: There is distension.     Palpations: Abdomen is soft. There is hepatomegaly.     Tenderness: There is abdominal tenderness in the right upper quadrant.  Musculoskeletal:        General: Normal range of motion.     Cervical back: Normal range of motion and neck supple.  Skin:    General: Skin is warm and dry.     Capillary Refill: Capillary refill takes less than 2 seconds.  Neurological:     General: No focal deficit present.     Mental Status: He is alert and oriented to person, place, and time.  Psychiatric:        Mood and Affect: Mood normal.         Behavior: Behavior normal.        Thought Content: Thought content normal.      UC Treatments / Results  Labs (all labs ordered are listed, but only abnormal results are displayed) Labs Reviewed - No data to display  EKG   Radiology No results found.  Procedures Procedures (including critical care time)  Medications Ordered in UC Medications - No data to display  Initial Impression / Assessment and Plan / UC Course  I have reviewed the triage vital signs and the nursing notes.  Pertinent labs & imaging results that were available during my care of the patient were reviewed by me and considered in my medical decision making (see chart for details).     Dizziness Nausea Fatigue SOB Abdominal Distension Scleral icterus Alcoholic cirrhosis   Presents today for dizziness, fatigue, shortness of breath, nausea since early this morning.  Also experiencing abdominal distention that he noticed was not there yesterday. History of alcoholic cirrhosis, had 4 L of fluid pulled from his abdomen 2 weeks ago, as well as needing fluid removed 2 weeks prior to that. Discussed that given his symptoms and medical history, he would be best served in the ER where they could check his labs, monitor his fluid status and remove fluid from his abdomen if needed Patient declined EMS Will go to ER via personal vehicle Stable at discharge Final Clinical Impressions(s) / UC Diagnoses   Final diagnoses:  Dizziness and giddiness  Nausea without vomiting  Other fatigue  SOB (shortness of breath)  Abdominal distension  Scleral icterus  Alcoholic cirrhosis, unspecified whether ascites present [facility])   Discharge Instructions   None    ED Prescriptions    None     PDMP not reviewed this encounter.   Moshe Cipro, NP 02/19/20 1314

## 2020-02-19 NOTE — ED Notes (Signed)
Patient is being discharged from the Urgent Care and sent to the Emergency Department via POV . Per Ashley Royalty NP, patient is in need of higher level of care due to hx of cirrohosis. Patient is aware and verbalizes understanding of plan of care.  Vitals:   02/19/20 1241  BP: 121/72  Pulse: 96  Temp: 97.7 F (36.5 C)  SpO2: 94%

## 2020-02-19 NOTE — ED Notes (Signed)
ED Provider at bedside. 

## 2020-02-19 NOTE — ED Triage Notes (Addendum)
Pt c/o abd distention and SOB-states he has hx of cirrhosis with recently "drained fluid" at [facility] ED with same sx-NAD-steady gait

## 2020-02-20 ENCOUNTER — Encounter (HOSPITAL_COMMUNITY): Payer: Self-pay | Admitting: Internal Medicine

## 2020-02-20 ENCOUNTER — Observation Stay (HOSPITAL_COMMUNITY): Payer: Managed Care, Other (non HMO)

## 2020-02-20 ENCOUNTER — Inpatient Hospital Stay (HOSPITAL_COMMUNITY): Payer: Managed Care, Other (non HMO)

## 2020-02-20 DIAGNOSIS — K7290 Hepatic failure, unspecified without coma: Secondary | ICD-10-CM | POA: Diagnosis present

## 2020-02-20 DIAGNOSIS — Z20822 Contact with and (suspected) exposure to covid-19: Secondary | ICD-10-CM | POA: Diagnosis present

## 2020-02-20 DIAGNOSIS — R140 Abdominal distension (gaseous): Secondary | ICD-10-CM | POA: Diagnosis present

## 2020-02-20 DIAGNOSIS — D6959 Other secondary thrombocytopenia: Secondary | ICD-10-CM | POA: Diagnosis present

## 2020-02-20 DIAGNOSIS — D684 Acquired coagulation factor deficiency: Secondary | ICD-10-CM | POA: Diagnosis present

## 2020-02-20 DIAGNOSIS — F1021 Alcohol dependence, in remission: Secondary | ICD-10-CM | POA: Diagnosis present

## 2020-02-20 DIAGNOSIS — Z8379 Family history of other diseases of the digestive system: Secondary | ICD-10-CM | POA: Diagnosis not present

## 2020-02-20 DIAGNOSIS — E8770 Fluid overload, unspecified: Secondary | ICD-10-CM | POA: Diagnosis present

## 2020-02-20 DIAGNOSIS — J90 Pleural effusion, not elsewhere classified: Secondary | ICD-10-CM | POA: Diagnosis present

## 2020-02-20 DIAGNOSIS — K7200 Acute and subacute hepatic failure without coma: Secondary | ICD-10-CM | POA: Diagnosis present

## 2020-02-20 DIAGNOSIS — D649 Anemia, unspecified: Secondary | ICD-10-CM | POA: Diagnosis present

## 2020-02-20 DIAGNOSIS — K7469 Other cirrhosis of liver: Secondary | ICD-10-CM | POA: Diagnosis present

## 2020-02-20 DIAGNOSIS — K7031 Alcoholic cirrhosis of liver with ascites: Secondary | ICD-10-CM | POA: Diagnosis present

## 2020-02-20 DIAGNOSIS — I9581 Postprocedural hypotension: Secondary | ICD-10-CM | POA: Diagnosis not present

## 2020-02-20 DIAGNOSIS — Z79899 Other long term (current) drug therapy: Secondary | ICD-10-CM | POA: Diagnosis not present

## 2020-02-20 DIAGNOSIS — D689 Coagulation defect, unspecified: Secondary | ICD-10-CM | POA: Diagnosis not present

## 2020-02-20 DIAGNOSIS — Z23 Encounter for immunization: Secondary | ICD-10-CM | POA: Diagnosis not present

## 2020-02-20 DIAGNOSIS — K7460 Unspecified cirrhosis of liver: Secondary | ICD-10-CM | POA: Diagnosis present

## 2020-02-20 DIAGNOSIS — E871 Hypo-osmolality and hyponatremia: Secondary | ICD-10-CM | POA: Diagnosis present

## 2020-02-20 LAB — HEPATIC FUNCTION PANEL
ALT: 50 U/L — ABNORMAL HIGH (ref 0–44)
AST: 80 U/L — ABNORMAL HIGH (ref 15–41)
Albumin: 2.2 g/dL — ABNORMAL LOW (ref 3.5–5.0)
Alkaline Phosphatase: 152 U/L — ABNORMAL HIGH (ref 38–126)
Bilirubin, Direct: 4 mg/dL — ABNORMAL HIGH (ref 0.0–0.2)
Indirect Bilirubin: 6.6 mg/dL — ABNORMAL HIGH (ref 0.3–0.9)
Total Bilirubin: 10.6 mg/dL — ABNORMAL HIGH (ref 0.3–1.2)
Total Protein: 6.6 g/dL (ref 6.5–8.1)

## 2020-02-20 LAB — BODY FLUID CELL COUNT WITH DIFFERENTIAL
Lymphs, Fluid: 35 %
Monocyte-Macrophage-Serous Fluid: 57 % (ref 50–90)
Neutrophil Count, Fluid: 8 % (ref 0–25)
Total Nucleated Cell Count, Fluid: 98 cu mm (ref 0–1000)

## 2020-02-20 LAB — BASIC METABOLIC PANEL
Anion gap: 9 (ref 5–15)
Anion gap: 12 (ref 5–15)
BUN: 38 mg/dL — ABNORMAL HIGH (ref 6–20)
BUN: 39 mg/dL — ABNORMAL HIGH (ref 6–20)
CO2: 17 mmol/L — ABNORMAL LOW (ref 22–32)
CO2: 18 mmol/L — ABNORMAL LOW (ref 22–32)
Calcium: 8.1 mg/dL — ABNORMAL LOW (ref 8.9–10.3)
Calcium: 8.3 mg/dL — ABNORMAL LOW (ref 8.9–10.3)
Chloride: 97 mmol/L — ABNORMAL LOW (ref 98–111)
Chloride: 93 mmol/L — ABNORMAL LOW (ref 98–111)
Creatinine, Ser: 1.12 mg/dL (ref 0.61–1.24)
Creatinine, Ser: 1.12 mg/dL (ref 0.61–1.24)
GFR, Estimated: >60 mL/min (ref >60)
GFR, Estimated: >60 mL/min (ref >60)
Glucose, Bld: 111 mg/dL — ABNORMAL HIGH (ref 70–99)
Glucose, Bld: 85 mg/dL (ref 70–99)
Potassium: 5.1 mmol/L (ref 3.5–5.1)
Potassium: 4.9 mmol/L (ref 3.5–5.1)
Sodium: 123 mmol/L — ABNORMAL LOW (ref 135–145)
Sodium: 123 mmol/L — ABNORMAL LOW (ref 135–145)

## 2020-02-20 LAB — PROTIME-INR
INR: 2.4 — ABNORMAL HIGH (ref 0.8–1.2)
Prothrombin Time: 25 seconds — ABNORMAL HIGH (ref 11.4–15.2)

## 2020-02-20 LAB — CBC
HCT: 24.5 % — ABNORMAL LOW (ref 39.0–52.0)
Hemoglobin: 8.1 g/dL — ABNORMAL LOW (ref 13.0–17.0)
MCH: 35.8 pg — ABNORMAL HIGH (ref 26.0–34.0)
MCHC: 33.1 g/dL (ref 30.0–36.0)
MCV: 108.4 fL — ABNORMAL HIGH (ref 80.0–100.0)
Platelets: 120 10*3/uL — ABNORMAL LOW (ref 150–400)
RBC: 2.26 MIL/uL — ABNORMAL LOW (ref 4.22–5.81)
RDW: 14.9 % (ref 11.5–15.5)
WBC: 8.4 10*3/uL (ref 4.0–10.5)
nRBC: 0 % (ref 0.0–0.2)

## 2020-02-20 LAB — ALBUMIN, PLEURAL OR PERITONEAL FLUID: Albumin, Fluid: <1 g/dL

## 2020-02-20 LAB — PROTEIN, PLEURAL OR PERITONEAL FLUID: Total protein, fluid: <3 g/dL

## 2020-02-20 LAB — HIV ANTIBODY (ROUTINE TESTING W REFLEX): HIV Screen 4th Generation wRfx: NONREACTIVE

## 2020-02-20 MED ORDER — PNEUMOCOCCAL VAC POLYVALENT 25 MCG/0.5ML IJ INJ
0.5000 mL | INJECTION | INTRAMUSCULAR | Status: DC
  Filled 2020-02-20: qty 0.5

## 2020-02-20 MED ORDER — SODIUM CHLORIDE 0.9 % IV SOLN
2.0000 g | INTRAVENOUS | Status: DC
  Administered 2020-02-20 – 2020-02-21 (×2): 2 g via INTRAVENOUS
  Filled 2020-02-20 (×2): qty 2
  Filled 2020-02-20: qty 20

## 2020-02-20 MED ORDER — MELATONIN 3 MG PO TABS
6.0000 mg | ORAL_TABLET | Freq: Every evening | ORAL | Status: DC | PRN
  Administered 2020-02-20: 6 mg via ORAL
  Filled 2020-02-20: qty 2

## 2020-02-20 MED ORDER — FUROSEMIDE 40 MG PO TABS
40.0000 mg | ORAL_TABLET | Freq: Two times a day (BID) | ORAL | Status: DC
  Administered 2020-02-20 (×2): 40 mg via ORAL
  Filled 2020-02-20 (×2): qty 1

## 2020-02-20 MED ORDER — LIDOCAINE HCL 1 % IJ SOLN
INTRAMUSCULAR | Status: AC
  Filled 2020-02-20: qty 20

## 2020-02-20 MED ORDER — LACTULOSE 10 GM/15ML PO SOLN
10.0000 g | Freq: Three times a day (TID) | ORAL | Status: DC
  Administered 2020-02-20 – 2020-02-21 (×5): 10 g via ORAL
  Filled 2020-02-20 (×5): qty 15

## 2020-02-20 MED ORDER — ZOLPIDEM TARTRATE 5 MG PO TABS
5.0000 mg | ORAL_TABLET | Freq: Once | ORAL | Status: AC
  Administered 2020-02-20: 5 mg via ORAL
  Filled 2020-02-20: qty 1

## 2020-02-20 MED ORDER — INFLUENZA VAC SPLIT QUAD 0.5 ML IM SUSY: 0.5000 mL | PREFILLED_SYRINGE | INTRAMUSCULAR | Status: DC

## 2020-02-20 MED ORDER — ALBUMIN HUMAN 5 % IV SOLN
12.5000 g | Freq: Once | INTRAVENOUS | Status: AC
  Administered 2020-02-20: 12.5 g via INTRAVENOUS
  Filled 2020-02-20: qty 250

## 2020-02-20 NOTE — TOC Initial Note (Signed)
Transition of Care [facility]) - Initial/Assessment Note    Patient Details  Name: Bruce Bell MRN: [id number] Date of Birth: 1986/04/01  [facility] CM/SW Contact:    Armanda Heritage, RN Phone Number: 02/20/2020, 9:19 AM  Clinical Narrative: CM received notification that patient is in need of pcp.  CM spoke with patient who reports he has contacted his insurance company and obtained list of in-network providers and set up an appointment for November 3.  No further TOC needs at this time.                    Expected Discharge Plan: Home/Self Care Barriers to Discharge: Continued Medical Work up   Patient Goals and CMS Choice Patient states their goals for this hospitalization and ongoing recovery are:: go back home      Expected Discharge Plan and Services Expected Discharge Plan: Home/Self Care                         DME Arranged: N/A DME Agency: NA       HH Arranged: NA          Prior Living Arrangements/Services   Lives with:: Self Patient language and need for interpreter reviewed:: Yes Do you feel safe going back to the place where you live?: Yes      Need for Family Participation in Patient Care: No (Comment) Care giver support system in place?: Yes (comment)   Criminal Activity/Legal Involvement Pertinent to Current Situation/Hospitalization: No - Comment as needed  Activities of Daily Living Home Assistive Devices/Equipment: Eyeglasses ADL Screening (condition at time of admission) Patient's cognitive ability adequate to safely complete daily activities?: Yes Is the patient deaf or have difficulty hearing?: No Does the patient have difficulty seeing, even when wearing glasses/contacts?: No Does the patient have difficulty concentrating, remembering, or making decisions?: No Patient able to express need for assistance with ADLs?: Yes Does the patient have difficulty dressing or bathing?: No Independently performs ADLs?: Yes  (appropriate for developmental age) Does the patient have difficulty walking or climbing stairs?: No Weakness of Legs: None Weakness of Arms/Hands: None  Permission Sought/Granted                  Emotional Assessment              Admission diagnosis:  Hyponatremia [E87.1] Acute liver failure without hepatic coma [K72.00] Hepatic cirrhosis, unspecified hepatic cirrhosis type, unspecified whether ascites present (HCC) [K74.60] Cirrhosis (HCC) [K74.60] Patient Active Problem List   Diagnosis Date Noted  . Decompensated liver disease (HCC) 02/20/2020  . Coagulopathy (HCC) 02/20/2020  . Cirrhosis (HCC) 02/20/2020  . Hyponatremia 02/19/2020   PCP:  [facility] [facility] Pharmacy:   [facility] [address] [place], [place] - [address] [place] [address] Phone: [phone] Fax: [phone]     Social Determinants of Health (SDOH) Interventions    Readmission Risk Interventions No flowsheet data found.

## 2020-02-20 NOTE — Plan of Care (Signed)
°  Problem: Coping: °Goal: Level of anxiety will decrease °Outcome: Progressing °  °

## 2020-02-20 NOTE — Progress Notes (Signed)
   02/20/20 0934  Assess: MEWS Score  Temp 97.9 F (36.6 C)  BP (!) 98/45  Pulse Rate (!) 115  Resp 18  SpO2 96 %  O2 Device Room Air  Assess: MEWS Score  MEWS Temp 0  MEWS Systolic 1  MEWS Pulse 2  MEWS RR 0  MEWS LOC 0  MEWS Score 3  MEWS Score Color Yellow  Assess: if the MEWS score is Yellow or Red  Were vital signs taken at a resting state? Yes  Focused Assessment No change from prior assessment  Early Detection of Sepsis Score *See Row Information* Low  MEWS guidelines implemented *See Row Information* No, previously yellow, continue vital signs every 4 hours

## 2020-02-20 NOTE — Progress Notes (Signed)
Patient is requesting a sleep aid.  PCP was notified. 

## 2020-02-20 NOTE — Progress Notes (Signed)
  PROGRESS NOTE  Patient admitted earlier this morning. See H&P.   Patient is a 34 year old male with past medical history significant for alcoholic cirrhosis.  He recently relocated from [place] and has not been able to establish with GI in town.  He presented to the ER due to increasing abdominal distention and shortness of breath.  Last paracentesis was about 2 weeks ago.  Paracentesis ordered Continue empiric Rocephin GI consulted  Status is: Inpatient  Remains inpatient appropriate because:Ongoing diagnostic testing needed not appropriate for outpatient work up and Inpatient level of care appropriate due to severity of illness   Dispo: The patient is from: Home              Anticipated d/c is to: Home              Anticipated d/c date is: 3 days              Patient currently is not medically stable to d/c.  Awaiting further work-up and medical improvement.     Noralee Stain, DO [facility] 02/20/2020, 11:01 AM  Available via Epic secure chat 7am-7pm After these hours, please refer to coverage provider listed on [email or web address]

## 2020-02-20 NOTE — Procedures (Signed)
Ultrasound-guided diagnostic and therapeutic paracentesis performed yielding 3.6 liters of turbid, yellow fluid. No immediate complications.  A portion of the fluid was submitted to the lab for preordered studies.  Due to patient hypotension only the above amount of fluid was removed today.  EBL <1 cc.

## 2020-02-20 NOTE — Consult Note (Signed)
Referring Provider: Dr. Noralee Stain Primary Care Physician:  [facility] Primary Gastroenterologist:  Gentry Fitz  Reason for Consultation:  Decompensated cirrhosis  HPI: Bruce Bell is a 34 y.o. 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  Patient previously seen at [facility] but recently moved to [facility].  Patient states he has been on 40 mg Lasix and 100 mg spironolactone.  He was in his usual state of health until 2 days ago, when he noted worsening abdominal distention and shortness of breath, which caused him to present to the ED.  Patient reports diffuse abdominal pain.  He is also experienced nausea but denies any vomiting.  Denies fever.  Denies dysphagia, GERD, changes in appetite, unexplained weight loss.  Denies any changes in bowel habits, melena, or hematochezia.  Patient was previously supposed to have an EGD for variceal screening but this was canceled due to hyponatremia.   No alcohol use for >1 year.   Past Medical History:  Diagnosis Date  . Anemia   . Cirrhosis (HCC)   . Hypervolemia   . Hyponatremia   . Liver cirrhosis [facility])     Past Surgical History:  Procedure Laterality Date  . PARACENTESIS      Prior to Admission medications   Medication Sig Start Date End Date Taking? Authorizing Provider  ciprofloxacin (CIPRO) 500 MG tablet Take 500 mg by mouth daily.   Yes [provider]  furosemide (LASIX) 40 MG tablet Take 40 mg by mouth.   Yes [provider]  lactulose (CHRONULAC) 10 GM/15ML solution Take 15 mLs by mouth in the morning, at noon, and at bedtime. 01/30/20  Yes [provider]  pantoprazole (PROTONIX) 40 MG tablet Take 40 mg by mouth daily.   Yes [provider]  spironolactone (ALDACTONE) 100 MG tablet Take 100 mg by mouth daily.   Yes [provider]    Scheduled Meds: . [START ON 02/21/2020] influenza vac  split quadrivalent PF  0.5 mL Intramuscular Tomorrow-1000  . lactulose  10 g Oral TID  . lidocaine      . pantoprazole  40 mg Oral Daily  . [START ON 02/21/2020] pneumococcal 23 valent vaccine  0.5 mL Intramuscular Tomorrow-1000   Continuous Infusions: . albumin human    . cefTRIAXone (ROCEPHIN)  IV Stopped (02/20/20 0246)   PRN Meds:.  Allergies as of 02/19/2020  . (No Known Allergies)    Family History  Problem Relation Age of Onset  . Liver disease Maternal Grandmother     Social History   Socioeconomic History  . Marital status: Single    Spouse name: Not on file  . Number of children: Not on file  . Years of education: Not on file  . Highest education level: Not on file  Occupational History  . Not on file  Tobacco Use  . Smoking status: Never Smoker  . Smokeless tobacco: Never Used  Vaping Use  . Vaping Use: Never used  Substance and Sexual Activity  . Alcohol use: Not Currently  . Drug use: Not Currently  . Sexual activity: Not on file  Other Topics Concern  . Not on file  Social History Narrative  . Not on file   Social Determinants of Health   Financial Resource Strain:   . Difficulty of Paying Living Expenses: Not on file  Food Insecurity:   . Worried About Programme researcher, broadcasting/film/video in the Last Year: Not on file  . Ran  Out of Food in the Last Year: Not on file  Transportation Needs:   . Lack of Transportation (Medical): Not on file  . Lack of Transportation (Non-Medical): Not on file  Physical Activity:   . Days of Exercise per Week: Not on file  . Minutes of Exercise per Session: Not on file  Stress:   . Feeling of Stress : Not on file  Social Connections:   . Frequency of Communication with Friends and Family: Not on file  . Frequency of Social Gatherings with Friends and Family: Not on file  . Attends Religious Services: Not on file  . Active Member of Clubs or Organizations: Not on file  . Attends Banker Meetings: Not on file  .  Marital Status: Not on file  Intimate Partner Violence:   . Fear of Current or Ex-Partner: Not on file  . Emotionally Abused: Not on file  . Physically Abused: Not on file  . Sexually Abused: Not on file    Review of Systems: Review of Systems  Constitutional: Negative for chills, fever and weight loss.  HENT: Negative for hearing loss and tinnitus.   Eyes: Negative for pain and redness.  Respiratory: Positive for shortness of breath. Negative for cough.   Cardiovascular: Negative for chest pain and palpitations.  Gastrointestinal: Positive for abdominal pain and nausea. Negative for blood in stool, constipation, diarrhea, heartburn, melena and vomiting.  Genitourinary: Negative for flank pain and hematuria.  Musculoskeletal: Negative for falls and joint pain.  Skin: Negative for itching and rash.  Neurological: Negative for seizures and loss of consciousness.  Endo/Heme/Allergies: Negative for polydipsia. Does not bruise/bleed easily.  Psychiatric/Behavioral: Negative for substance abuse. The patient is not nervous/anxious.      Physical Exam: Vital signs: Vitals:   02/20/20 1053 02/20/20 1103  BP: (!) 82/40 (!) 104/44  Pulse:    Resp:    Temp:    SpO2:     Last BM Date: 02/19/20 Physical Exam Constitutional:      General: He is not in acute distress.    Appearance: He is ill-appearing.  HENT:     Head: Normocephalic and atraumatic.     Nose: Nose normal.     Mouth/Throat:     Mouth: Mucous membranes are moist.     Pharynx: Oropharynx is clear.  Eyes:     General: Scleral icterus present.     Extraocular Movements: Extraocular movements intact.  Cardiovascular:     Rate and Rhythm: Normal rate and regular rhythm.     Pulses: Normal pulses.     Heart sounds: Normal heart sounds.  Pulmonary:     Effort: Tachypnea and accessory muscle usage present.     Breath sounds: Normal breath sounds.  Abdominal:     General: Bowel sounds are normal. There is distension.      Palpations: Abdomen is soft. There is no mass.     Tenderness: There is abdominal tenderness (mild, epigastric). There is no guarding or rebound.     Hernia: No hernia is present.  Musculoskeletal:        General: No swelling or tenderness.     Cervical back: Normal range of motion and neck supple.  Skin:    General: Skin is warm and dry.     Coloration: Skin is jaundiced.  Neurological:     General: No focal deficit present.     Mental Status: He is oriented to person, place, and time. He is lethargic.  Psychiatric:  Mood and Affect: Mood normal.        Behavior: Behavior normal.     GI:  Lab Results: Recent Labs    02/19/20 1646 02/20/20 0451  WBC 8.0 8.4  HGB 8.6* 8.1*  HCT 25.8* 24.5*  PLT 130* 120*   BMET Recent Labs    02/19/20 1646 02/20/20 0451 02/20/20 0747  NA 122* 123* 123*  K 4.6 5.1 4.9  CL 96* 97* 93*  CO2 18* 17* 18*  GLUCOSE 124* 111* 85  BUN 37* 38* 39*  CREATININE 1.21 1.12 1.12  CALCIUM 8.2* 8.1* 8.3*   LFT Recent Labs    02/20/20 0451  PROT 6.6  ALBUMIN 2.2*  AST 80*  ALT 50*  ALKPHOS 152*  BILITOT 10.6*  BILIDIR 4.0*  IBILI 6.6*   PT/INR Recent Labs    02/19/20 1830 02/20/20 0451  LABPROT 23.8* 25.0*  INR 2.2* 2.4*     Studies/Results: DG CHEST PORT 1 VIEW  Result Date: 02/20/2020 CLINICAL DATA:  Shortness of breath EXAM: PORTABLE CHEST 1 VIEW COMPARISON:  02/07/2020 FINDINGS: Large right pleural effusion with worsened right upper lobe collapse. Near complete opacification of the right hemithorax. Left lung is clear. IMPRESSION: Large right pleural effusion with worsened right upper lobe collapse. Electronically Signed   By: Deatra Robinson M.D.   On: 02/20/2020 03:49    Impression: Decompensated cirrhosis: MELD score of 31 as of 02/20/20 -T bili 10.6/AST 80/ALT 50/ALP 152 -WBCs normal (8.4) -Presumably ETOH. Prior work-up at [facility] included negative Hepatitis B and C with positive Hepatitis A IgG (immunity  or vaccine). Negative ASMA/AMA.  Normal AFP.  No ceruloplasmin or alpha-1 antitrypsin specimens seen in record review.   -Paracentesis today with 3.6L removed, no additional ascitic fluid removed due to hypotension.  Cytology and fluid analysis pending.  History of SBP, on prophylactic Cipro.  Plan: Await cytology/analysis from paracentesis.  We will consider increasing diuretics tomorrow pending blood pressure and renal function test.  Continue 2 g sodium restricted diet.  Alpha-1 antitrypsin and ceruloplasmin ordered for tomorrow morning.  Continue supportive care.  Patient will need referral to [facility] transplant team, as he recently moved from [place].  Patient will also require outpatient EGD for esophageal variceal screening.  [facility] will follow.   LOS: 0 days   Edrick Kins  PA-C 02/20/2020, 11:33 AM  Contact #  [phone]

## 2020-02-20 NOTE — H&P (Signed)
History and Physical    Bruce Bell OMB:[id number] DOB: 1985/06/10 DOA: 02/19/2020  PCP: [facility]  Patient coming from: Home.  Chief Complaint: Increasing abdominal distention.  HPI: Bruce Bell is a 34 y.o. male with known history of alcoholic liver cirrhosis who has just recently moved from [place] last month presents to the ER because of increasing abdominal distention and shortness of breath.  Patient had been to the ER about 2 weeks ago when he had a paracentesis.  Denies any abdominal pain fever chills nausea vomiting or diarrhea.  Patient states he has been compliant with his diuretics and lactulose and takes ciprofloxacin every day.  Patient was diagnosed with SBP infection in May of this year.  Patient is on a transplant list.  ED Course: In the ER on exam patient had distended abdomen and lab work show sodium of 122 hemoglobin of 8.6 platelets of 130 INR of 2.2.  Patient has been admitted from hyponatremia with decompensated liver cirrhosis.  Covid test was negative.  Patient's total bilirubin is around 12.6 AST of 90 ALT of 55.  Review of Systems: As per HPI, rest all negative.   Past Medical History:  Diagnosis Date  . Anemia   . Cirrhosis (HCC)   . Hypervolemia   . Hyponatremia   . Liver cirrhosis [facility])     Past Surgical History:  Procedure Laterality Date  . PARACENTESIS       reports that he has never smoked. He has never used smokeless tobacco. He reports previous alcohol use. He reports previous drug use.  No Known Allergies  Family History  Problem Relation Age of Onset  . Liver disease Maternal Grandmother     Prior to Admission medications   Medication Sig Start Date End Date Taking? Authorizing Provider  ciprofloxacin (CIPRO) 500 MG tablet Take 500 mg by mouth daily.   Yes [provider]  furosemide (LASIX) 40 MG tablet Take 40 mg by mouth.   Yes [provider]  lactulose (CHRONULAC) 10 GM/15ML solution  Take 15 mLs by mouth in the morning, at noon, and at bedtime. 01/30/20  Yes [provider]  pantoprazole (PROTONIX) 40 MG tablet Take 40 mg by mouth daily.   Yes [provider]  spironolactone (ALDACTONE) 100 MG tablet Take 100 mg by mouth daily.   Yes [provider]    Physical Exam: Constitutional: Moderately built and nourished. Vitals:   02/19/20 2320 02/20/20 0055 02/20/20 0127 02/20/20 0130  BP: (!) 112/40 115/60  121/62  Pulse: (!) 116 (!) 112  (!) 113  Resp: (!) 21 20  20   Temp:  97.8 F (36.6 C)  98.1 F (36.7 C)  TempSrc:  Oral  Oral  SpO2: 96% 96%  97%  Weight:   73.2 kg   Height:   5' 8.5" (1.74 m)    Eyes: Mild icterus no pallor. ENMT: No discharge from the ears eyes nose or mouth. Neck: No mass felt.  No neck rigidity. Respiratory: No rhonchi or crepitations. Cardiovascular: S1-S2 heard. Abdomen: Distended nontender bowel sounds present. Musculoskeletal: No edema. Skin: No rash. Neurologic: Alert awake oriented to time place and person.  Moves all extremities. Psychiatric: Appears normal.  Normal affect.   Labs on Admission: I have personally reviewed following labs and imaging studies  CBC: Recent Labs  Lab 02/19/20 1646  WBC 8.0  HGB 8.6*  HCT 25.8*  MCV 107.1*  PLT 130*   Basic Metabolic Panel: Recent Labs  Lab  02/19/20 1646  NA 122*  K 4.6  CL 96*  CO2 18*  GLUCOSE 124*  BUN 37*  CREATININE 1.21  CALCIUM 8.2*   GFR: Estimated Creatinine Clearance: 85.5 mL/min (by C-G formula based on SCr of 1.21 mg/dL). Liver Function Tests: Recent Labs  Lab 02/19/20 1646  AST 90*  ALT 55*  ALKPHOS 148*  BILITOT 12.6*  PROT 7.0  ALBUMIN 2.3*   Recent Labs  Lab 02/19/20 1646  LIPASE 76*   No results for input(s): AMMONIA in the last 168 hours. Coagulation Profile: Recent Labs  Lab 02/19/20 1830  INR 2.2*   Cardiac Enzymes: No results for input(s): CKTOTAL, CKMB, CKMBINDEX, TROPONINI in the last 168  hours. BNP (last 3 results) No results for input(s): PROBNP in the last 8760 hours. HbA1C: No results for input(s): HGBA1C in the last 72 hours. CBG: No results for input(s): GLUCAP in the last 168 hours. Lipid Profile: No results for input(s): CHOL, HDL, LDLCALC, TRIG, CHOLHDL, LDLDIRECT in the last 72 hours. Thyroid Function Tests: No results for input(s): TSH, T4TOTAL, FREET4, T3FREE, THYROIDAB in the last 72 hours. Anemia Panel: No results for input(s): VITAMINB12, FOLATE, FERRITIN, TIBC, IRON, RETICCTPCT in the last 72 hours. Urine analysis:    Component Value Date/Time   COLORURINE AMBER (A) 02/19/2020 1646   APPEARANCEUR CLEAR 02/19/2020 1646   LABSPEC 1.020 02/19/2020 1646   PHURINE 5.5 02/19/2020 1646   GLUCOSEU NEGATIVE 02/19/2020 1646   HGBUR MODERATE (A) 02/19/2020 1646   BILIRUBINUR SMALL (A) 02/19/2020 1646   KETONESUR NEGATIVE 02/19/2020 1646   PROTEINUR NEGATIVE 02/19/2020 1646   NITRITE NEGATIVE 02/19/2020 1646   LEUKOCYTESUR TRACE (A) 02/19/2020 1646   Sepsis Labs: @LABRCNTIP (procalcitonin:4,lacticidven:4) ) Recent Results (from the past 240 hour(s))  Respiratory Panel by RT PCR (Flu A&B, Covid) - Nasopharyngeal Swab     Status: None   Collection Time: 02/19/20  6:04 PM   Specimen: Nasopharyngeal Swab  Result Value Ref Range Status   SARS Coronavirus 2 by RT PCR NEGATIVE NEGATIVE Final    Comment: (NOTE) SARS-CoV-2 target nucleic acids are NOT DETECTED.  The SARS-CoV-2 RNA is generally detectable in upper respiratoy specimens during the acute phase of infection. The lowest concentration of SARS-CoV-2 viral copies this assay can detect is 131 copies/mL. A negative result does not preclude SARS-Cov-2 infection and should not be used as the sole basis for treatment or other patient management decisions. A negative result may occur with  improper specimen collection/handling, submission of specimen other than nasopharyngeal swab, presence of viral  mutation(s) within the areas targeted by this assay, and inadequate number of viral copies (<131 copies/mL). A negative result must be combined with clinical observations, patient history, and epidemiological information. The expected result is Negative.  Fact Sheet for Patients:  04/20/20  Fact Sheet for Healthcare Providers:  https://www.moore.com/  This test is no t yet approved or cleared by the https://www.young.biz/ FDA and  has been authorized for detection and/or diagnosis of SARS-CoV-2 by FDA under an Emergency Use Authorization (EUA). This EUA will remain  in effect (meaning this test can be used) for the duration of the COVID-19 declaration under Section 564(b)(1) of the Act, 21 U.S.C. section 360bbb-3(b)(1), unless the authorization is terminated or revoked sooner.     Influenza A by PCR NEGATIVE NEGATIVE Final   Influenza B by PCR NEGATIVE NEGATIVE Final    Comment: (NOTE) The Xpert Xpress SARS-CoV-2/FLU/RSV assay is intended as an aid in  the diagnosis of influenza from  Nasopharyngeal swab specimens and  should not be used as a sole basis for treatment. Nasal washings and  aspirates are unacceptable for Xpert Xpress SARS-CoV-2/FLU/RSV  testing.  Fact Sheet for Patients: https://www.moore.com/  Fact Sheet for Healthcare Providers: https://www.young.biz/  This test is not yet approved or cleared by the [place] FDA and  has been authorized for detection and/or diagnosis of SARS-CoV-2 by  FDA under an Emergency Use Authorization (EUA). This EUA will remain  in effect (meaning this test can be used) for the duration of the  Covid-19 declaration under Section 564(b)(1) of the Act, 21  U.S.C. section 360bbb-3(b)(1), unless the authorization is  terminated or revoked. Performed at [facility], [address] [place], [place] [address]      Radiological Exams on  Admission: No results found.    Assessment/Plan Principal Problem:   Decompensated liver disease (HCC) Active Problems:   Hyponatremia   Coagulopathy (HCC)   Cirrhosis (HCC)    1. 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  We will keep patient on SBP coverage with ceftriaxone for now.  Continue with Lasix.  Hold spironolactone due to hyponatremia.  Continue lactulose.  Patient's MELD score is 32.  Consult GI. 2. Hyponatremia likely from fluid overload and also could be contributing from spironolactone which we will hold off for now.  Lasix has been ordered.  Follow metabolic panel closely. 3. Shortness of breath could be from fluid/ascites.  Will check chest x-ray.  I have ordered Lasix now.  Closely monitor. 4. Coagulopathy secondary to liver cirrhosis.  Follow closely.  No active bleed at this time. 5. Previous history of alcoholism has not had any alcohol for the last 1 year as per the patient. 6. Thrombocytopenia likely from cirrhosis.  Given that patient has decompensated liver cirrhosis will need close monitoring for any further worsening in inpatient status.   DVT prophylaxis: SCDs.  Due to coagulopathy avoiding anticoagulation. Code Status: Full code. Family Communication: Discussed with patient. Disposition Plan: Home. Consults called: None. Admission status: Inpatient.   Eduard Clos MD [facility] Pager [phone].  If 7PM-7AM, please contact night-coverage [email or web address] Password [email or web address]  02/20/2020, 3:25 AM

## 2020-02-21 DIAGNOSIS — E871 Hypo-osmolality and hyponatremia: Secondary | ICD-10-CM

## 2020-02-21 DIAGNOSIS — K7460 Unspecified cirrhosis of liver: Secondary | ICD-10-CM

## 2020-02-21 DIAGNOSIS — J90 Pleural effusion, not elsewhere classified: Secondary | ICD-10-CM | POA: Diagnosis present

## 2020-02-21 DIAGNOSIS — R0602 Shortness of breath: Secondary | ICD-10-CM

## 2020-02-21 LAB — CBC
HCT: 23.6 % — ABNORMAL LOW (ref 39.0–52.0)
Hemoglobin: 7.8 g/dL — ABNORMAL LOW (ref 13.0–17.0)
MCH: 35.6 pg — ABNORMAL HIGH (ref 26.0–34.0)
MCHC: 33.1 g/dL (ref 30.0–36.0)
MCV: 107.8 fL — ABNORMAL HIGH (ref 80.0–100.0)
Platelets: 109 10*3/uL — ABNORMAL LOW (ref 150–400)
RBC: 2.19 MIL/uL — ABNORMAL LOW (ref 4.22–5.81)
RDW: 14.6 % (ref 11.5–15.5)
WBC: 7.3 10*3/uL (ref 4.0–10.5)
nRBC: 0 % (ref 0.0–0.2)

## 2020-02-21 LAB — COMPREHENSIVE METABOLIC PANEL
ALT: 50 U/L — ABNORMAL HIGH (ref 0–44)
AST: 68 U/L — ABNORMAL HIGH (ref 15–41)
Albumin: 2.3 g/dL — ABNORMAL LOW (ref 3.5–5.0)
Alkaline Phosphatase: 130 U/L — ABNORMAL HIGH (ref 38–126)
Anion gap: 9 (ref 5–15)
BUN: 40 mg/dL — ABNORMAL HIGH (ref 6–20)
CO2: 19 mmol/L — ABNORMAL LOW (ref 22–32)
Calcium: 8.4 mg/dL — ABNORMAL LOW (ref 8.9–10.3)
Chloride: 99 mmol/L (ref 98–111)
Creatinine, Ser: 1.11 mg/dL (ref 0.61–1.24)
GFR, Estimated: >60 mL/min (ref >60)
Glucose, Bld: 86 mg/dL (ref 70–99)
Potassium: 5 mmol/L (ref 3.5–5.1)
Sodium: 127 mmol/L — ABNORMAL LOW (ref 135–145)
Total Bilirubin: 10.7 mg/dL — ABNORMAL HIGH (ref 0.3–1.2)
Total Protein: 6.4 g/dL — ABNORMAL LOW (ref 6.5–8.1)

## 2020-02-21 LAB — CYTOLOGY - NON PAP

## 2020-02-21 MED ORDER — HYDROXYZINE HCL 25 MG PO TABS
25.0000 mg | ORAL_TABLET | Freq: Three times a day (TID) | ORAL | Status: DC | PRN
  Administered 2020-02-21: 25 mg via ORAL
  Filled 2020-02-21: qty 1

## 2020-02-21 MED ORDER — SPIRONOLACTONE 25 MG PO TABS
100.0000 mg | ORAL_TABLET | Freq: Every day | ORAL | Status: DC
  Administered 2020-02-21: 100 mg via ORAL
  Filled 2020-02-21: qty 4

## 2020-02-21 MED ORDER — FUROSEMIDE 40 MG PO TABS
40.0000 mg | ORAL_TABLET | Freq: Every day | ORAL | Status: DC
  Administered 2020-02-21: 40 mg via ORAL
  Filled 2020-02-21: qty 1

## 2020-02-21 MED ORDER — HYDROXYZINE HCL 25 MG PO TABS
25.0000 mg | ORAL_TABLET | Freq: Once | ORAL | Status: AC
  Administered 2020-02-21: 25 mg via ORAL
  Filled 2020-02-21: qty 1

## 2020-02-21 MED ORDER — HYDROXYZINE HCL 25 MG PO TABS: 25.0000 mg | ORAL_TABLET | Freq: Once | ORAL | Status: DC

## 2020-02-21 MED ORDER — HYDROXYZINE HCL 25 MG PO TABS: 25.0000 mg | ORAL_TABLET | Freq: Three times a day (TID) | ORAL | 0 refills | Status: DC | PRN

## 2020-02-21 NOTE — Discharge Summary (Signed)
Physician Discharge Summary  Bruce Bell XBD:[id number] DOB: 1985-05-15 DOA: 02/19/2020  PCP: [place] date: 02/19/2020 Discharge date: 02/21/2020  Time spent: 50 minutes  Recommendations for Outpatient Follow-up:  1. Follow-up with PCP as scheduled.  On follow-up patient will need a basic metabolic profile done to follow-up on electrolytes and renal function.  Patient's chronic right pleural effusion will need to be followed up upon.  Patient was asymptomatic by day of discharge. 2. Follow-up with Dr. Alessandra Bevels, gastroenterology in 4 weeks.  On follow-up patient will likely need referral to [facility] as an outpatient for evaluation for liver transplant, follow-up on alpha 1 antitrypsin phenotype and ceruloplasmin level, outpatient EGD to screen for esophageal varices.  Comprehensive metabolic profile to follow-up on liver enzymes.   Discharge Diagnoses:  Principal Problem:   Decompensated liver disease ([place]) Active Problems:   Hyponatremia   Coagulopathy (HCC)   Cirrhosis (HCC)   Pleural effusion on right   Discharge Condition: Stable and improved  Diet recommendation: Heart healthy  Filed Weights   02/20/20 0127 02/21/20 0455  Weight: 73.2 kg 70.2 kg    History of present illness:  HPI per Dr. Serita Grit is a 33 y.o. male with known history of alcoholic liver cirrhosis who has just recently moved from [place] last month presented to the ER because of increasing abdominal distention and shortness of breath.  Patient had been to the ER about 2 weeks ago when he had a paracentesis.  Denied any abdominal pain fever chills nausea vomiting or diarrhea.  Patient stated he had been compliant with his diuretics and lactulose and takes ciprofloxacin every day.  Patient was diagnosed with SBP infection in May of this year.  Patient is on a transplant list.  ED Course: In the ER on exam patient had distended abdomen and lab work show sodium of 122  hemoglobin of 8.6 platelets of 130 INR of 2.2.  Patient has been admitted from hyponatremia with decompensated liver cirrhosis.  Covid test was negative.  Patient's total bilirubin is around 12.6 AST of 90 ALT of 55.  Hospital Course:  #1 decompensated cirrhosis Meld score 31 as of 02/20/2020.  Patient noted to have a total bilirubin of 10.6, AST of 80, ALT of 50, alk phosphatase of 152.  It was presumed patient is decompensated cirrhosis presumably from alcohol.  Patient noted to have a prior work-up at [facility] included negative hepatitis B and C with positive hep A IgG(immunity of vaccine), negative ASMA/AMA.  Patient noted to have a normal AFP.  Patient was admitted placed on Lasix and spironolactone held.  IR consulted for ultrasound-guided diagnostic and therapeutic paracentesis.  Patient was placed on IV Rocephin empirically.  GI consulted will follow the patient during the hospitalization.  Patient underwent ultrasound-guided paracentesis which was done 02/20/2020 with 3.6 L of fluid removed and no additional ascitic fluid removed due to hypotension.  Cultures were negative.  Patient improved clinically.  Spironolactone was resumed per GI recommendations.  Patient maintained on lactulose.  Patient also maintained on home regimen Lasix.  Alpha-1 antitrypsin phenotype and ceruloplasmin level were ordered and were pending at time of discharge.  Patient improved clinically will be discharged home in stable condition with follow-up with GI in the outpatient setting.  GI recommending outpatient EGD to screen for esophageal varices, avoidance of beta-blocker because of history of SBP, continuation of ciprofloxacin prophylactically with no further inpatient GI work-up planned.  GI recommended outpatient follow-up with GI in 4  weeks.  2.  Hyponatremia Patient noted to be hyponatremic on admission secondary to likely volume overload from cirrhosis.  Patient spironolactone was held.  Patient underwent  paracentesis.  Patient also maintained on Lasix.  Spironolactone resumed on day of discharge per GI recommendations.  Hyponatremia improved and was up to 127 by day of discharge.  Outpatient follow-up.  3.  Shortness of breath/right-sided pleural effusion Patient presented with shortness of breath which was felt likely secondary to decompensated cirrhosis.  Chest x-ray done noted large right pleural effusion with worsening right upper lobe collapse which was similar to chest x-ray done from 02/07/2020.  Pleural effusion likely secondary to cirrhosis.  Patient improved clinically post paracentesis.  Patient maintained on Lasix and spironolactone resumed.  Patient ambulated in the hallway with no significant shortness of breath.  On day of discharge patient with sats of 96% on room air.  Outpatient follow-up.  4.  Coagulopathy secondary to liver cirrhosis Remained stable.  Patient had no bleeding during the hospitalization.  Outpatient follow-up.  5.  Prior history of alcohol abuse Patient noted not to have had any alcohol over the past year.  Patient had no signs of withdrawal during the hospitalization.  Patient commended on alcohol cessation.  6.  Thrombocytopenia Secondary to cirrhosis.  Patient with no overt bleeding.  Remained stable.  Outpatient follow-up.  Procedures:  Ultrasound-guided diagnostic and therapeutic paracentesis with 3.6 L of peritoneal fluid removed per Dr. Vernard Gambles 02/20/2020  Chest x-ray 02/20/2020    Consultations:  Gastroenterology: Dr. Alessandra Bevels 02/20/2020  Interventional radiology  Discharge Exam: Vitals:   02/21/20 1305 02/21/20 1701  BP: (!) 119/58 (!) 107/51  Pulse: (!) 109 (!) 117  Resp: 20 20  Temp: 98.1 F (36.7 C) 98.3 F (36.8 C)  SpO2: 95% 95%    General: NAD Cardiovascular: RRR Respiratory: CTAB  Discharge Instructions   Discharge Instructions    Diet - low sodium heart healthy   Complete by: As directed    1200cc /day.   Increase  activity slowly   Complete by: As directed      Allergies as of 02/21/2020   No Known Allergies     Medication List    TAKE these medications   ciprofloxacin 500 MG tablet Commonly known as: CIPRO Take 500 mg by mouth daily.   furosemide 40 MG tablet Commonly known as: LASIX Take 40 mg by mouth.   hydrOXYzine 25 MG tablet Commonly known as: ATARAX/VISTARIL Take 1 tablet (25 mg total) by mouth 3 (three) times daily as needed for anxiety.   lactulose 10 GM/15ML solution Commonly known as: CHRONULAC Take 15 mLs by mouth in the morning, at noon, and at bedtime.   pantoprazole 40 MG tablet Commonly known as: PROTONIX Take 40 mg by mouth daily.   spironolactone 100 MG tablet Commonly known as: ALDACTONE Take 100 mg by mouth daily.      No Known Allergies  Follow-up Information    [place]. Schedule an appointment as soon as possible for a visit.   Why: f/u as scheduled  Contact information: [address] [place] [address] [phone]        Otis Brace, MD. Schedule an appointment as soon as possible for a visit in 4 week(s).   Specialty: Gastroenterology Contact information: [place] [address] [phone]                The results of significant diagnostics from this hospitalization (including imaging,  microbiology, ancillary and laboratory) are listed below for reference.    Significant Diagnostic Studies: DG Chest 2 View  Result Date: 02/07/2020 CLINICAL DATA:  Shortness of breath, cirrhosis, abdominal distension EXAM: CHEST - 2 VIEW COMPARISON:  01/26/2020 FINDINGS: Similar large right pleural effusion with associated collapse/consolidation of the right mid and lower lung. Right apex remains aerated. Stable clear left lung. Normal heart size and vascularity. No pneumothorax. Trachea midline. No acute osseous finding. IMPRESSION: Similar large right effusion and associated right mid and  lower lung collapse/consolidation. Electronically Signed   By: Jerilynn Mages.  Shick M.D.   On: 02/07/2020 13:28   DG Chest 2 View  Result Date: 01/26/2020 CLINICAL DATA:  Shortness of breath, abdominal distension EXAM: CHEST - 2 VIEW COMPARISON:  None. FINDINGS: Large right pleural effusion with some adjacent opacity in the lung likely passive atelectasis though underlying infection/consolidation within this lung would be difficult to exclude. Left lung is clear. Visible cardiomediastinal contours are unremarkable. No acute osseous or soft tissue abnormality. IMPRESSION: Large right pleural effusion with adjacent opacity likely reflecting passive atelectasis though underlying infection/consolidation within this lung would be difficult to exclude. Electronically Signed   By: Lovena Le M.D.   On: 01/26/2020 15:03   US Paracentesis  Result Date: 02/20/2020 INDICATION: Patient with history of alcoholic cirrhosis, recurrent ascites; request received for diagnostic and therapeutic paracentesis. EXAM: ULTRASOUND GUIDED DIAGNOSTIC AND THERAPEUTIC PARACENTESIS MEDICATIONS: 1% lidocaine to skin and subcutaneous tissue COMPLICATIONS: None immediate. PROCEDURE: Informed written consent was obtained from the patient after a discussion of the risks, benefits and alternatives to treatment. A timeout was performed prior to the initiation of the procedure. Initial ultrasound scanning demonstrates a moderate to large amount of ascites within the right lower abdominal quadrant. The right lower abdomen was prepped and draped in the usual sterile fashion. 1% lidocaine was used for local anesthesia. Following this, a 19 gauge, 10-cm, Yueh catheter was introduced. An ultrasound image was saved for documentation purposes. The paracentesis was performed. The catheter was removed and a dressing was applied. The patient tolerated the procedure well without immediate post procedural complication. FINDINGS: A total of approximately 3.6 liters  of turbid, yelllow fluid was removed. Samples were sent to the laboratory as requested by the clinical team. Due to patient hypotension only the above amount of fluid was removed today. IMPRESSION: Successful ultrasound-guided diagnostic and therapeutic paracentesis yielding 3.6 liters of peritoneal fluid. Read by: Rowe Robert, PA-C Electronically Signed   By: Lucrezia Europe M.D.   On: 02/20/2020 11:09   US Paracentesis  Result Date: 02/08/2020 INDICATION: Alcoholic cirrhosis with recurrent ascites. Request for diagnostic and therapeutic paracentesis. EXAM: ULTRASOUND GUIDED PARACENTESIS MEDICATIONS: 1% lidocaine 10 mL COMPLICATIONS: None immediate. PROCEDURE: Informed written consent was obtained from the patient after a discussion of the risks, benefits and alternatives to treatment. A timeout was performed prior to the initiation of the procedure. Initial ultrasound scanning demonstrates a moderate amount of ascites within the right lateral abdomen quadrant. The right lateral abdomen was prepped and draped in the usual sterile fashion. 1% lidocaine was used for local anesthesia. Following this, a 6 Fr Safe-T-Centesis catheter was introduced. An ultrasound image was saved for documentation purposes. The paracentesis was performed. The catheter was removed and a dressing was applied. The patient tolerated the procedure well without immediate post procedural complication. FINDINGS: A total of approximately 4.7 L of clear yellow fluid was removed. Samples were sent to the laboratory as requested by the clinical team. IMPRESSION: Successful ultrasound-guided  paracentesis yielding 4.7 liters of peritoneal fluid. Read by: Gareth Eagle, PA-C Electronically Signed   By: Markus Daft M.D.   On: 02/08/2020 11:29   US Paracentesis  Result Date: 01/29/2020 INDICATION: Patient history of alcoholic cirrhosis with recurrent ascites. Request is for therapeutic paracentesis EXAM: ULTRASOUND GUIDED THERAPEUTIC PARACENTESIS  MEDICATIONS: Lidocaine 1% 10 mL COMPLICATIONS: None immediate. PROCEDURE: Informed written consent was obtained from the patient after a discussion of the risks, benefits and alternatives to treatment. A timeout was performed prior to the initiation of the procedure. Initial ultrasound scanning demonstrates a moderate amount of ascites within the right lower abdominal quadrant. The right lower abdomen was prepped and draped in the usual sterile fashion. 1% lidocaine was used for local anesthesia. Following this, a 6 Fr Safe-T-Centesis catheter was introduced. An ultrasound image was saved for documentation purposes. The paracentesis was performed. The catheter was removed and a dressing was applied. The patient tolerated the procedure well without immediate post procedural complication. FINDINGS: A total of approximately 5.6 L of straw-colored fluid was removed. IMPRESSION: Successful ultrasound-guided therapeutic paracentesis yielding 5.6 liters of peritoneal fluid. Read by: Rushie Nyhan, NP Electronically Signed   By: Jerilynn Mages.  Shick M.D.   On: 01/26/2020 16:23   DG CHEST PORT 1 VIEW  Result Date: 02/20/2020 CLINICAL DATA:  Shortness of breath EXAM: PORTABLE CHEST 1 VIEW COMPARISON:  02/07/2020 FINDINGS: Large right pleural effusion with worsened right upper lobe collapse. Near complete opacification of the right hemithorax. Left lung is clear. IMPRESSION: Large right pleural effusion with worsened right upper lobe collapse. Electronically Signed   By: Ulyses Jarred M.D.   On: 02/20/2020 03:49   DG Abd Portable 2 Views  Result Date: 02/07/2020 CLINICAL DATA:  Abdominal distension EXAM: X-RAY ABDOMEN 2 VIEWS COMPARISON:  None. FINDINGS: The bowel gas pattern is normal. There is no evidence of free air. No radio-opaque calculi or other significant radiographic abnormality is seen. Partially visualized pleural effusion at the right lung base. IMPRESSION: 1. Negative abdominal radiographs. 2. Partially  visualized pleural effusion at the right lung base. Please refer to dedicated same-day chest radiograph. Electronically Signed   By: Davina Poke D.O.   On: 02/07/2020 13:30    Microbiology: Recent Results (from the past 240 hour(s))  Respiratory Panel by RT PCR (Flu A&B, Covid) - Nasopharyngeal Swab     Status: None   Collection Time: 02/19/20  6:04 PM   Specimen: Nasopharyngeal Swab  Result Value Ref Range Status   SARS Coronavirus 2 by RT PCR NEGATIVE NEGATIVE Final    Comment: (NOTE) SARS-CoV-2 target nucleic acids are NOT DETECTED.  The SARS-CoV-2 RNA is generally detectable in upper respiratoy specimens during the acute phase of infection. The lowest concentration of SARS-CoV-2 viral copies this assay can detect is 131 copies/mL. A negative result does not preclude SARS-Cov-2 infection and should not be used as the sole basis for treatment or other patient management decisions. A negative result may occur with  improper specimen collection/handling, submission of specimen other than nasopharyngeal swab, presence of viral mutation(s) within the areas targeted by this assay, and inadequate number of viral copies (<131 copies/mL). A negative result must be combined with clinical observations, patient history, and epidemiological information. The expected result is Negative.  Fact Sheet for Patients:  PinkCheek.be  Fact Sheet for Healthcare Providers:  GravelBags.it  This test is no t yet approved or cleared by the [place] FDA and  has been authorized for detection and/or diagnosis of SARS-CoV-2 by  FDA under an Emergency Use Authorization (EUA). This EUA will remain  in effect (meaning this test can be used) for the duration of the COVID-19 declaration under Section 564(b)(1) of the Act, 21 U.S.C. section 360bbb-3(b)(1), unless the authorization is terminated or revoked sooner.     Influenza A by PCR  NEGATIVE NEGATIVE Final   Influenza B by PCR NEGATIVE NEGATIVE Final    Comment: (NOTE) The Xpert Xpress SARS-CoV-2/FLU/RSV assay is intended as an aid in  the diagnosis of influenza from Nasopharyngeal swab specimens and  should not be used as a sole basis for treatment. Nasal washings and  aspirates are unacceptable for Xpert Xpress SARS-CoV-2/FLU/RSV  testing.  Fact Sheet for Patients: PinkCheek.be  Fact Sheet for Healthcare Providers: GravelBags.it  This test is not yet approved or cleared by the [place] FDA and  has been authorized for detection and/or diagnosis of SARS-CoV-2 by  FDA under an Emergency Use Authorization (EUA). This EUA will remain  in effect (meaning this test can be used) for the duration of the  Covid-19 declaration under Section 564(b)(1) of the Act, 21  U.S.C. section 360bbb-3(b)(1), unless the authorization is  terminated or revoked. Performed at [facility], [address] [place], [place] [address]   Body fluid culture     Status: None (Preliminary result)   Collection Time: 02/20/20 11:11 AM   Specimen: PATH Cytology Peritoneal fluid  Result Value Ref Range Status   Specimen Description   Final    PERITONEAL Performed at [facility], [place] [address], [place], [place] [address]    Special Requests   Final    NONE Performed at [facility], [place] [address], [place], [place] [address]    Gram Stain NO WBC SEEN NO ORGANISMS SEEN CYTOSPIN SMEAR   Final   Culture   Final    NO GROWTH < 24 HOURS Performed at [facility], [place] [address] [place], [place] [address]    Report Status PENDING  Incomplete     Labs: Basic Metabolic Panel: Recent Labs  Lab 02/19/20 1646 02/20/20 0451 02/20/20 0747 02/21/20 0513  NA 122* 123* 123* 127*  K 4.6 5.1 4.9 5.0  CL 96* 97* 93* 99  CO2 18* 17* 18* 19*  GLUCOSE 124* 111* 85 86  BUN  37* 38* 39* 40*  CREATININE 1.21 1.12 1.12 1.11  CALCIUM 8.2* 8.1* 8.3* 8.4*   Liver Function Tests: Recent Labs  Lab 02/19/20 1646 02/20/20 0451 02/21/20 0513  AST 90* 80* 68*  ALT 55* 50* 50*  ALKPHOS 148* 152* 130*  BILITOT 12.6* 10.6* 10.7*  PROT 7.0 6.6 6.4*  ALBUMIN 2.3* 2.2* 2.3*   Recent Labs  Lab 02/19/20 1646  LIPASE 76*   No results for input(s): AMMONIA in the last 168 hours. CBC: Recent Labs  Lab 02/19/20 1646 02/20/20 0451 02/21/20 0513  WBC 8.0 8.4 7.3  HGB 8.6* 8.1* 7.8*  HCT 25.8* 24.5* 23.6*  MCV 107.1* 108.4* 107.8*  PLT 130* 120* 109*   Cardiac Enzymes: No results for input(s): CKTOTAL, CKMB, CKMBINDEX, TROPONINI in the last 168 hours. BNP: BNP (last 3 results) No results for input(s): BNP in the last 8760 hours.  ProBNP (last 3 results) No results for input(s): PROBNP in the last 8760 hours.  CBG: No results for input(s): GLUCAP in the last 168 hours.     Signed:  Irine Seal MD.  [facility] 02/21/2020, 5:28 PM

## 2020-02-21 NOTE — TOC Progression Note (Signed)
Transition of Care [facility]) - Progression Note    Patient Details  Name: Bruce Bell MRN: [id number] Date of Birth: [birth year]  [facility] CM/SW Contact  Darleene Cleaver, [place] Phone Number: 02/21/2020, 3:42 PM  Clinical Narrative:     CSW was informed that patient wanted to speak to CSW.  CSW contacted patient via phone.  Patient stated he applied for Medicaid and was wondering what the status was.  CSW informed him that he would have to contact Department of Social Services to find out status.  CSW offered to give him the phone number, patient said that was not necessary, and he will follow up on his own.  No other CSW needs, signing off.  Expected Discharge Plan: Home/Self Care Barriers to Discharge: Continued Medical Work up  Expected Discharge Plan and Services Expected Discharge Plan: Home/Self Care                         DME Arranged: N/A DME Agency: NA       HH Arranged: NA           Social Determinants of Health (SDOH) Interventions    Readmission Risk Interventions No flowsheet data found.

## 2020-02-21 NOTE — Progress Notes (Signed)
[facility] Gastroenterology Progress Note  Bruce Bell 34 y.o. 01/13/1986  CC:  Decompensated cirrhosis  Subjective: Patient repots feeling much better today.  Denies shortness of breath, abdominal pain, nausea, vomiting, melena, or hematochezia. Last bowel movement yesterday.  He is tolerating a regular diet.  Reports panic attacks and anxiety.  ROS : Review of Systems  Respiratory: Negative for cough and shortness of breath.   Gastrointestinal: Negative for abdominal pain, blood in stool, constipation, diarrhea, heartburn, melena, nausea and vomiting.  Psychiatric/Behavioral: Negative for substance abuse. The patient is nervous/anxious.    Objective: Vital signs in last 24 hours: Vitals:   02/21/20 0455 02/21/20 0500  BP:  129/74  Pulse:  (!) 108  Resp:  20  Temp:  98.4 F (36.9 C)  SpO2: 97% 96%    Physical Exam:  General:  Alert, oriented, cooperative, no acute distress  Head:  Normocephalic, without obvious abnormality, atraumatic  Eyes:  Deep icterus, EOMs intact  Lungs:   Clear to auscultation bilaterally, respirations unlabored  Heart:  Mildly tachycardic with regular rhythm, S1, S2 normal  Abdomen:   Mildly distended but soft and nontender. Bowel sounds active all four quadrants, no guarding or peritoneal signs.  Extremities: Extremities normal, atraumatic, no  edema  Pulses: 2+ and symmetric    Lab Results: Recent Labs    02/20/20 0747 02/21/20 0513  NA 123* 127*  K 4.9 5.0  CL 93* 99  CO2 18* 19*  GLUCOSE 85 86  BUN 39* 40*  CREATININE 1.12 1.11  CALCIUM 8.3* 8.4*   Recent Labs    02/20/20 0451 02/21/20 0513  AST 80* 68*  ALT 50* 50*  ALKPHOS 152* 130*  BILITOT 10.6* 10.7*  PROT 6.6 6.4*  ALBUMIN 2.2* 2.3*   Recent Labs    02/20/20 0451 02/21/20 0513  WBC 8.4 7.3  HGB 8.1* 7.8*  HCT 24.5* 23.6*  MCV 108.4* 107.8*  PLT 120* 109*   Recent Labs    02/19/20 1830 02/20/20 0451  LABPROT 23.8* 25.0*  INR 2.2* 2.4*     Assessment: Decompensated cirrhosis: Most likely from alcohol use.  MELD score of 31 as of 02/20/20 -T bili 10.7/AST 68/ALT 50/ALP 130 today -WBCs remain normal (7.3) -Presumably ETOH. Prior work-up at [facility] included negative Hepatitis B and C with positive Hepatitis A IgG (immunity or vaccine). Negative ASMA/AMA.  Normal AFP.  No ceruloplasmin or alpha-1 antitrypsin specimens seen in record review. -Alpha-1 antitrypsin and ceruloplasmin pending.   -Paracentesis yesterday with 3.6L removed, no additional ascitic fluid removed due to hypotension.  Culture negative, body fluid cell count unremarkable  History of SBP, on prophylactic Cipro.  Plan: Continue Lasix 40 mg qd, restart spironolactone 100 mg qd.  Continue 2 g sodium restricted diet.  Dietician consultation pending.  Continue prophylactic Cipro.  Paracentesis can be performed weekly as an outpatient as needed, but do not recommend paracentesis more than once per week due to hypotension and concern for kidney injury.  Patient will also require outpatient EGD for esophageal variceal screening.  Avoid Beta blockers due to history of SBP.  We will arrange follow-up in our office in approximately 4 weeks.  Okay to discharge from GI standpoint.  [facility] will sign off.  Thank you for this consultation.  Please contact us if we can be of any further assistance during this hospital stay.  Edrick Kins PA-C 02/21/2020, 9:43 AM  Contact #  [phone]

## 2020-02-21 NOTE — Progress Notes (Signed)
NUTRITION NOTE  Consult received for diet education. Patient with hx of alcoholic cirrhosis. He is currently ordered a 2 gram Na diet with 1.2 L fluid restriction. He has been eating 100% of all meals since admission.  Patient currently lives with family but is planning to move into his own apartment on Friday (10/15). Encouraged him to purchase measuring cups and measuring spoons and possibly even a food scale.   Patient very interested in diet education; he denies having education at any point in the past. He has gotten paracenteses in the past with most recent being ~2 weeks PTA. He reports that prior to that, he had not had a paracentesis x6 months.   Provided patient with handouts from the Academy of [employer] Diet[employer] and reviewed each handout in detail with him: "Low Sodium Nutrition Therapy," "Sodium Content of Foods," "Sodium-Free Flavoring Tips," and "Fluid-Restricted Nutrition Therapy."  He asks about using salt replacement (such as a KCl salt). Encouraged patient to talk with MD about this in case he needs to monitor K intake for any reason. No listed hx of renal disease.   Expect good compliance. BMI of 23 kg/m2 indicates normal weight status.  No further nutrition-related needs at this time. Encouraged patient to let RN or MD know if he has additional nutrition-related questions or concerns so that RD can be re-consulted.     Trenton Gammon, MS, RD, LDN, CNSC Inpatient Clinical Dietitian RD pager # available in [facility]  After hours/weekend pager # available in [facility]

## 2020-02-21 NOTE — Progress Notes (Signed)
Pt to be discharged to home this evening. Discharge instructions including all Medications and schedules for these Medications reviewed with the Pt. Pt verbalized understanding of all discharge teaching. Discharge packet with Pt at time of discharge

## 2020-02-22 ENCOUNTER — Inpatient Hospital Stay
Admission: EM | Admit: 2020-02-22 | Discharge: 2020-03-01 | DRG: 432 | Disposition: A | Discharge status: Other Acute Inpatient Hospital | Payer: Commercial Managed Care - HMO | Attending: Internal Medicine | Admitting: Internal Medicine

## 2020-02-22 ENCOUNTER — Other Ambulatory Visit: Payer: Self-pay

## 2020-02-22 ENCOUNTER — Encounter
Admission: EM | Disposition: A | Discharge status: Other Acute Inpatient Hospital | Payer: Self-pay | Source: Home / Self Care | Attending: Hospitalist

## 2020-02-22 ENCOUNTER — Inpatient Hospital Stay: Payer: Commercial Managed Care - HMO

## 2020-02-22 ENCOUNTER — Inpatient Hospital Stay: Payer: Commercial Managed Care - HMO | Admitting: Anesthesiology

## 2020-02-22 ENCOUNTER — Encounter: Payer: Self-pay | Admitting: Emergency Medicine

## 2020-02-22 DIAGNOSIS — E8779 Other fluid overload: Secondary | ICD-10-CM | POA: Diagnosis present

## 2020-02-22 DIAGNOSIS — K7469 Other cirrhosis of liver: Secondary | ICD-10-CM | POA: Diagnosis present

## 2020-02-22 DIAGNOSIS — K7460 Unspecified cirrhosis of liver: Secondary | ICD-10-CM

## 2020-02-22 DIAGNOSIS — Z7682 Awaiting organ transplant status: Secondary | ICD-10-CM | POA: Diagnosis not present

## 2020-02-22 DIAGNOSIS — R64 Cachexia: Secondary | ICD-10-CM | POA: Diagnosis present

## 2020-02-22 DIAGNOSIS — E7220 Disorder of urea cycle metabolism, unspecified: Secondary | ICD-10-CM | POA: Diagnosis present

## 2020-02-22 DIAGNOSIS — K831 Obstruction of bile duct: Secondary | ICD-10-CM

## 2020-02-22 DIAGNOSIS — F1021 Alcohol dependence, in remission: Secondary | ICD-10-CM | POA: Diagnosis present

## 2020-02-22 DIAGNOSIS — I959 Hypotension, unspecified: Secondary | ICD-10-CM | POA: Diagnosis not present

## 2020-02-22 DIAGNOSIS — Z79899 Other long term (current) drug therapy: Secondary | ICD-10-CM | POA: Diagnosis not present

## 2020-02-22 DIAGNOSIS — J948 Other specified pleural conditions: Secondary | ICD-10-CM | POA: Diagnosis present

## 2020-02-22 DIAGNOSIS — Z8619 Personal history of other infectious and parasitic diseases: Secondary | ICD-10-CM

## 2020-02-22 DIAGNOSIS — K920 Hematemesis: Secondary | ICD-10-CM | POA: Diagnosis not present

## 2020-02-22 DIAGNOSIS — K766 Portal hypertension: Secondary | ICD-10-CM | POA: Diagnosis present

## 2020-02-22 DIAGNOSIS — R0789 Other chest pain: Secondary | ICD-10-CM

## 2020-02-22 DIAGNOSIS — R0602 Shortness of breath: Secondary | ICD-10-CM | POA: Diagnosis not present

## 2020-02-22 DIAGNOSIS — I8511 Secondary esophageal varices with bleeding: Secondary | ICD-10-CM

## 2020-02-22 DIAGNOSIS — R188 Other ascites: Secondary | ICD-10-CM

## 2020-02-22 DIAGNOSIS — K7031 Alcoholic cirrhosis of liver with ascites: Principal | ICD-10-CM

## 2020-02-22 DIAGNOSIS — R040 Epistaxis: Secondary | ICD-10-CM | POA: Diagnosis not present

## 2020-02-22 DIAGNOSIS — E871 Hypo-osmolality and hyponatremia: Secondary | ICD-10-CM | POA: Diagnosis present

## 2020-02-22 DIAGNOSIS — Z20822 Contact with and (suspected) exposure to covid-19: Secondary | ICD-10-CM | POA: Diagnosis present

## 2020-02-22 DIAGNOSIS — D684 Acquired coagulation factor deficiency: Secondary | ICD-10-CM | POA: Diagnosis present

## 2020-02-22 DIAGNOSIS — G8929 Other chronic pain: Secondary | ICD-10-CM | POA: Diagnosis present

## 2020-02-22 DIAGNOSIS — Z6824 Body mass index (BMI) 24.0-24.9, adult: Secondary | ICD-10-CM

## 2020-02-22 DIAGNOSIS — D6959 Other secondary thrombocytopenia: Secondary | ICD-10-CM | POA: Diagnosis present

## 2020-02-22 DIAGNOSIS — Z9889 Other specified postprocedural states: Secondary | ICD-10-CM | POA: Diagnosis not present

## 2020-02-22 DIAGNOSIS — R1084 Generalized abdominal pain: Secondary | ICD-10-CM

## 2020-02-22 DIAGNOSIS — J918 Pleural effusion in other conditions classified elsewhere: Secondary | ICD-10-CM | POA: Diagnosis present

## 2020-02-22 DIAGNOSIS — E43 Unspecified severe protein-calorie malnutrition: Secondary | ICD-10-CM | POA: Diagnosis present

## 2020-02-22 DIAGNOSIS — K7290 Hepatic failure, unspecified without coma: Secondary | ICD-10-CM

## 2020-02-22 DIAGNOSIS — D689 Coagulation defect, unspecified: Secondary | ICD-10-CM | POA: Diagnosis present

## 2020-02-22 DIAGNOSIS — K922 Gastrointestinal hemorrhage, unspecified: Secondary | ICD-10-CM | POA: Diagnosis present

## 2020-02-22 DIAGNOSIS — K3189 Other diseases of stomach and duodenum: Secondary | ICD-10-CM | POA: Diagnosis present

## 2020-02-22 DIAGNOSIS — K7040 Alcoholic hepatic failure without coma: Secondary | ICD-10-CM | POA: Diagnosis present

## 2020-02-22 DIAGNOSIS — K652 Spontaneous bacterial peritonitis: Secondary | ICD-10-CM | POA: Diagnosis not present

## 2020-02-22 DIAGNOSIS — Z792 Long term (current) use of antibiotics: Secondary | ICD-10-CM

## 2020-02-22 DIAGNOSIS — K819 Cholecystitis, unspecified: Secondary | ICD-10-CM

## 2020-02-22 DIAGNOSIS — J90 Pleural effusion, not elsewhere classified: Secondary | ICD-10-CM

## 2020-02-22 DIAGNOSIS — D62 Acute posthemorrhagic anemia: Secondary | ICD-10-CM | POA: Diagnosis present

## 2020-02-22 HISTORY — PX: ESOPHAGOGASTRODUODENOSCOPY: SHX5428

## 2020-02-22 HISTORY — DX: Dyspnea, unspecified: R06.00

## 2020-02-22 LAB — CBC WITH DIFFERENTIAL/PLATELET
Abs Immature Granulocytes: 0.26 10*3/uL — ABNORMAL HIGH (ref 0.00–0.07)
Basophils Absolute: 0.1 10*3/uL (ref 0.0–0.1)
Basophils Relative: 1 %
Eosinophils Absolute: 0.2 10*3/uL (ref 0.0–0.5)
Eosinophils Relative: 2 %
HCT: 27 % — ABNORMAL LOW (ref 39.0–52.0)
Hemoglobin: 9.2 g/dL — ABNORMAL LOW (ref 13.0–17.0)
Immature Granulocytes: 3 %
Lymphocytes Relative: 11 %
Lymphs Abs: 1.1 10*3/uL (ref 0.7–4.0)
MCH: 36.2 pg — ABNORMAL HIGH (ref 26.0–34.0)
MCHC: 34.1 g/dL (ref 30.0–36.0)
MCV: 106.3 fL — ABNORMAL HIGH (ref 80.0–100.0)
Monocytes Absolute: 1.2 10*3/uL — ABNORMAL HIGH (ref 0.1–1.0)
Monocytes Relative: 12 %
Neutro Abs: 6.8 10*3/uL (ref 1.7–7.7)
Neutrophils Relative %: 71 %
Platelets: 133 10*3/uL — ABNORMAL LOW (ref 150–400)
RBC: 2.54 MIL/uL — ABNORMAL LOW (ref 4.22–5.81)
RDW: 14.5 % (ref 11.5–15.5)
WBC: 9.6 10*3/uL (ref 4.0–10.5)
nRBC: 0 % (ref 0.0–0.2)

## 2020-02-22 LAB — COMPREHENSIVE METABOLIC PANEL
ALT: 54 U/L — ABNORMAL HIGH (ref 0–44)
AST: 82 U/L — ABNORMAL HIGH (ref 15–41)
Albumin: 2.8 g/dL — ABNORMAL LOW (ref 3.5–5.0)
Alkaline Phosphatase: 174 U/L — ABNORMAL HIGH (ref 38–126)
Anion gap: 5 (ref 5–15)
BUN: 38 mg/dL — ABNORMAL HIGH (ref 6–20)
CO2: 24 mmol/L (ref 22–32)
Calcium: 8.6 mg/dL — ABNORMAL LOW (ref 8.9–10.3)
Chloride: 94 mmol/L — ABNORMAL LOW (ref 98–111)
Creatinine, Ser: 1.07 mg/dL (ref 0.61–1.24)
GFR, Estimated: >60 mL/min (ref >60)
Glucose, Bld: 79 mg/dL (ref 70–99)
Potassium: 5 mmol/L (ref 3.5–5.1)
Sodium: 123 mmol/L — ABNORMAL LOW (ref 135–145)
Total Bilirubin: 12.3 mg/dL — ABNORMAL HIGH (ref 0.3–1.2)
Total Protein: 7.7 g/dL (ref 6.5–8.1)

## 2020-02-22 LAB — AMMONIA: Ammonia: 25 umol/L (ref 9–35)

## 2020-02-22 LAB — TYPE AND SCREEN
ABO/RH(D): B POS
Antibody Screen: NEGATIVE

## 2020-02-22 LAB — URINE CULTURE

## 2020-02-22 LAB — ABO/RH: ABO/RH(D): B POS

## 2020-02-22 LAB — BODY FLUID CELL COUNT WITH DIFFERENTIAL
Eos, Fluid: 0 %
Lymphs, Fluid: 58 %
Monocyte-Macrophage-Serous Fluid: 38 %
Neutrophil Count, Fluid: 4 %
Total Nucleated Cell Count, Fluid: 277 cu mm

## 2020-02-22 LAB — HEMOGLOBIN AND HEMATOCRIT, BLOOD
HCT: 21.2 % — ABNORMAL LOW (ref 39.0–52.0)
Hemoglobin: 7.2 g/dL — ABNORMAL LOW (ref 13.0–17.0)

## 2020-02-22 LAB — RESPIRATORY PANEL BY RT PCR (FLU A&B, COVID)
Influenza A by PCR: NEGATIVE
Influenza B by PCR: NEGATIVE
SARS Coronavirus 2 by RT PCR: NEGATIVE

## 2020-02-22 LAB — CERULOPLASMIN: Ceruloplasmin: 18.6 mg/dL (ref 16.0–31.0)

## 2020-02-22 LAB — APTT: aPTT: 36 seconds (ref 24–36)

## 2020-02-22 LAB — PROTIME-INR
INR: 2 — ABNORMAL HIGH (ref 0.8–1.2)
INR: 1.7 — ABNORMAL HIGH (ref 0.8–1.2)
Prothrombin Time: 22.3 seconds — ABNORMAL HIGH (ref 11.4–15.2)
Prothrombin Time: 19.4 seconds — ABNORMAL HIGH (ref 11.4–15.2)

## 2020-02-22 LAB — PATHOLOGIST SMEAR REVIEW

## 2020-02-22 SURGERY — EGD (ESOPHAGOGASTRODUODENOSCOPY)
Anesthesia: General

## 2020-02-22 MED ORDER — LACTATED RINGERS IV BOLUS
1000.0000 mL | Freq: Once | INTRAVENOUS | Status: AC
  Administered 2020-02-22: 1000 mL via INTRAVENOUS

## 2020-02-22 MED ORDER — FENTANYL CITRATE (PF) 100 MCG/2ML IJ SOLN
INTRAMUSCULAR | Status: DC | PRN
Start: 2020-02-22 — End: 2020-02-22
  Administered 2020-02-22: 50 ug via INTRAVENOUS
  Administered 2020-02-22: 25 ug via INTRAVENOUS

## 2020-02-22 MED ORDER — ONDANSETRON HCL 4 MG/2ML IJ SOLN
4.0000 mg | Freq: Once | INTRAMUSCULAR | Status: AC
  Administered 2020-02-22: 4 mg via INTRAVENOUS
  Filled 2020-02-22: qty 2

## 2020-02-22 MED ORDER — MORPHINE SULFATE (PF) 2 MG/ML IV SOLN
2.0000 mg | INTRAVENOUS | Status: DC | PRN
  Administered 2020-02-22: 2 mg via INTRAVENOUS
  Filled 2020-02-22: qty 1

## 2020-02-22 MED ORDER — PANTOPRAZOLE SODIUM 40 MG IV SOLR
40.0000 mg | Freq: Two times a day (BID) | INTRAVENOUS | Status: DC
  Administered 2020-02-25 – 2020-02-26 (×2): 40 mg via INTRAVENOUS
  Filled 2020-02-22 (×2): qty 40

## 2020-02-22 MED ORDER — SODIUM CHLORIDE 0.9 % IV SOLN
8.0000 mg/h | INTRAVENOUS | Status: AC
  Administered 2020-02-22 – 2020-02-25 (×7): 8 mg/h via INTRAVENOUS
  Filled 2020-02-22 (×7): qty 80

## 2020-02-22 MED ORDER — LACTULOSE 10 GM/15ML PO SOLN
10.0000 g | Freq: Three times a day (TID) | ORAL | Status: DC
  Administered 2020-02-22 – 2020-03-01 (×23): 10 g via ORAL
  Filled 2020-02-22 (×25): qty 30

## 2020-02-22 MED ORDER — MIDAZOLAM HCL 2 MG/2ML IJ SOLN
INTRAMUSCULAR | Status: DC | PRN
  Administered 2020-02-22: 1 mg via INTRAVENOUS

## 2020-02-22 MED ORDER — SUCCINYLCHOLINE CHLORIDE 200 MG/10ML IV SOSY
PREFILLED_SYRINGE | INTRAVENOUS | Status: AC
  Filled 2020-02-22: qty 10

## 2020-02-22 MED ORDER — SODIUM CHLORIDE 0.9% IV SOLUTION
Freq: Once | INTRAVENOUS | Status: DC
  Filled 2020-02-22: qty 250

## 2020-02-22 MED ORDER — SODIUM CHLORIDE 0.9 % IV SOLN: INTRAVENOUS | Status: DC

## 2020-02-22 MED ORDER — ONDANSETRON HCL 4 MG PO TABS: 4.0000 mg | ORAL_TABLET | Freq: Four times a day (QID) | ORAL | Status: DC | PRN

## 2020-02-22 MED ORDER — VITAMIN K1 10 MG/ML IJ SOLN
10.0000 mg | Freq: Once | INTRAVENOUS | Status: AC
  Administered 2020-02-22: 10 mg via INTRAVENOUS
  Filled 2020-02-22: qty 1

## 2020-02-22 MED ORDER — MIDAZOLAM HCL 2 MG/2ML IJ SOLN
INTRAMUSCULAR | Status: AC
  Filled 2020-02-22: qty 2

## 2020-02-22 MED ORDER — SODIUM CHLORIDE 0.9 % IV SOLN: 250.0000 mL | INTRAVENOUS | Status: DC | PRN

## 2020-02-22 MED ORDER — OCTREOTIDE LOAD VIA INFUSION
50.0000 ug | Freq: Once | INTRAVENOUS | Status: AC
  Administered 2020-02-22: 50 ug via INTRAVENOUS
  Filled 2020-02-22: qty 25

## 2020-02-22 MED ORDER — ONDANSETRON HCL 4 MG/2ML IJ SOLN
4.0000 mg | Freq: Four times a day (QID) | INTRAMUSCULAR | Status: DC | PRN
  Administered 2020-02-25: 4 mg via INTRAVENOUS
  Filled 2020-02-22: qty 2

## 2020-02-22 MED ORDER — SODIUM CHLORIDE 0.9% FLUSH
3.0000 mL | Freq: Two times a day (BID) | INTRAVENOUS | Status: DC
  Administered 2020-02-22 – 2020-03-01 (×12): 3 mL via INTRAVENOUS

## 2020-02-22 MED ORDER — DEXAMETHASONE SODIUM PHOSPHATE 10 MG/ML IJ SOLN
INTRAMUSCULAR | Status: AC
  Filled 2020-02-22: qty 1

## 2020-02-22 MED ORDER — DEXAMETHASONE SODIUM PHOSPHATE 10 MG/ML IJ SOLN
INTRAMUSCULAR | Status: DC | PRN
  Administered 2020-02-22: 10 mg via INTRAVENOUS

## 2020-02-22 MED ORDER — PROPOFOL 10 MG/ML IV BOLUS
INTRAVENOUS | Status: AC
  Filled 2020-02-22: qty 20

## 2020-02-22 MED ORDER — ORAL CARE MOUTH RINSE
15.0000 mL | Freq: Two times a day (BID) | OROMUCOSAL | Status: DC
  Administered 2020-02-23 – 2020-02-28 (×10): 15 mL via OROMUCOSAL

## 2020-02-22 MED ORDER — CHLORHEXIDINE GLUCONATE 0.12 % MT SOLN
15.0000 mL | Freq: Two times a day (BID) | OROMUCOSAL | Status: DC
  Administered 2020-02-22 – 2020-02-29 (×14): 15 mL via OROMUCOSAL
  Filled 2020-02-22 (×16): qty 15

## 2020-02-22 MED ORDER — FENTANYL CITRATE (PF) 100 MCG/2ML IJ SOLN
INTRAMUSCULAR | Status: AC
  Filled 2020-02-22: qty 2

## 2020-02-22 MED ORDER — SODIUM CHLORIDE 0.9 % IV SOLN
INTRAVENOUS | Status: DC
  Administered 2020-02-22: 1000 mL via INTRAVENOUS

## 2020-02-22 MED ORDER — SODIUM CHLORIDE 0.9 % IV SOLN
1.0000 g | Freq: Once | INTRAVENOUS | Status: AC
  Administered 2020-02-22: 1 g via INTRAVENOUS
  Filled 2020-02-22: qty 10

## 2020-02-22 MED ORDER — SUCCINYLCHOLINE CHLORIDE 20 MG/ML IJ SOLN
INTRAMUSCULAR | Status: DC | PRN
  Administered 2020-02-22: 100 mg via INTRAVENOUS

## 2020-02-22 MED ORDER — ONDANSETRON HCL 4 MG/2ML IJ SOLN
INTRAMUSCULAR | Status: AC
  Filled 2020-02-22: qty 2

## 2020-02-22 MED ORDER — LIDOCAINE HCL (CARDIAC) PF 100 MG/5ML IV SOSY
PREFILLED_SYRINGE | INTRAVENOUS | Status: DC | PRN
  Administered 2020-02-22: 80 mg via INTRAVENOUS

## 2020-02-22 MED ORDER — SODIUM CHLORIDE 0.9 % IV SOLN
2.0000 g | INTRAVENOUS | Status: DC
  Administered 2020-02-23: 2 g via INTRAVENOUS
  Filled 2020-02-22: qty 2

## 2020-02-22 MED ORDER — MORPHINE SULFATE (PF) 4 MG/ML IV SOLN
4.0000 mg | INTRAVENOUS | Status: DC | PRN
  Administered 2020-02-22 – 2020-02-24 (×5): 4 mg via INTRAVENOUS
  Filled 2020-02-22 (×5): qty 1

## 2020-02-22 MED ORDER — PHENYLEPHRINE HCL (PRESSORS) 10 MG/ML IV SOLN
INTRAVENOUS | Status: AC
  Filled 2020-02-22: qty 1

## 2020-02-22 MED ORDER — PHENYLEPHRINE HCL (PRESSORS) 10 MG/ML IV SOLN
INTRAVENOUS | Status: DC | PRN
  Administered 2020-02-22 (×2): 100 ug via INTRAVENOUS
  Administered 2020-02-22: 200 ug via INTRAVENOUS
  Administered 2020-02-22: 100 ug via INTRAVENOUS

## 2020-02-22 MED ORDER — SODIUM CHLORIDE 0.9% FLUSH
3.0000 mL | INTRAVENOUS | Status: DC | PRN
  Administered 2020-02-24: 3 mL via INTRAVENOUS

## 2020-02-22 MED ORDER — PROPOFOL 10 MG/ML IV BOLUS
INTRAVENOUS | Status: DC | PRN
  Administered 2020-02-22: 150 mg via INTRAVENOUS

## 2020-02-22 MED ORDER — LIDOCAINE HCL (PF) 2 % IJ SOLN
INTRAMUSCULAR | Status: AC
  Filled 2020-02-22: qty 5

## 2020-02-22 MED ORDER — SODIUM CHLORIDE 0.9 % IV SOLN
50.0000 ug/h | INTRAVENOUS | Status: DC
  Administered 2020-02-22 – 2020-02-25 (×7): 50 ug/h via INTRAVENOUS
  Filled 2020-02-22 (×14): qty 1

## 2020-02-22 MED ORDER — SODIUM CHLORIDE 0.9 % IV SOLN
80.0000 mg | Freq: Once | INTRAVENOUS | Status: AC
  Administered 2020-02-22: 80 mg via INTRAVENOUS
  Filled 2020-02-22: qty 80

## 2020-02-22 MED ORDER — ONDANSETRON HCL 4 MG/2ML IJ SOLN
INTRAMUSCULAR | Status: DC | PRN
  Administered 2020-02-22: 4 mg via INTRAVENOUS

## 2020-02-22 NOTE — Consult Note (Signed)
Vonda Antigua, MD [address], [place], [place], [place], [address] [address], [place], [place], [place], [address] Phone: [phone]  Fax: [phone]  Consultation  Referring Provider:     Dr. Francine Graven Primary Care Physician:  [place] Reason for Consultation:     [facility]  Date of Admission:  02/22/2020 Date of Consultation:  02/22/2020         HPI:   Bruce Bell is a 34 y.o. male with history of alcoholic liver cirrhosis, abstinent [place].  He has recently moved to [facility] and has been evaluated at [facility] before and plan was to place him on liver transplant list but this has not started here.  Patient denies any previous history of GI bleeding and has never had an endoscopy for variceal screening.  He started having hematemesis yesterday afternoon, with last episode being yesterday night and no further episodes today.  Also reports melena.  Patient has chronic elevation in bilirubin above 10 noted in care everywhere as well.  Recent GI note a few days ago when he was admitted to [facility] with decompensated cirrhosis, see chart.  As per their note previous work-up has included "Prior work-up at [facility] included negative Hepatitis B and C with positive Hepatitis A IgG (immunity or vaccine). Negative ASMA/AMA.  Normal AFP.  No ceruloplasmin or alpha-1 antitrypsin specimens seen in record review."  Ceruloplasmin and alpha-1 antitrypsin were drawn and are pending  Last paracentesis was 2 days ago with 3.6 L of fluid removed.  Patient has previous history of SBP and is on SBP prophylaxis and as an outpatient  Past Medical History:  Diagnosis Date  . Anemia   . Cirrhosis ([place])   . Hypervolemia   . Hyponatremia   . Liver cirrhosis [facility])     Past Surgical History:  Procedure Laterality Date  . PARACENTESIS      Prior to Admission medications     Medication Sig Start Date End Date Taking? Authorizing Provider  ciprofloxacin (CIPRO) 500 MG tablet Take 500 mg by mouth daily.   Yes [provider]  furosemide (LASIX) 40 MG tablet Take 40 mg by mouth.   Yes [provider]  hydrOXYzine (ATARAX/VISTARIL) 25 MG tablet Take 1 tablet (25 mg total) by mouth 3 (three) times daily as needed for anxiety. 02/21/20  Yes Eugenie Filler, MD  lactulose (CHRONULAC) 10 GM/15ML solution Take 15 mLs by mouth in the morning, at noon, and at bedtime. 01/30/20  Yes [provider]  pantoprazole (PROTONIX) 40 MG tablet Take 40 mg by mouth daily.   Yes [provider]  spironolactone (ALDACTONE) 100 MG tablet Take 100 mg by mouth daily.   Yes [provider]    Family History  Problem Relation Age of Onset  . Liver disease Maternal Grandmother      Social History   Tobacco Use  . Smoking status: Never Smoker  . Smokeless tobacco: Never Used  Vaping Use  . Vaping Use: Never used  Substance Use Topics  . Alcohol use: Not Currently  . Drug use: Not Currently    Allergies as of 02/22/2020  . (No Known Allergies)    Review of Systems:    All systems reviewed and negative except where noted in HPI.   Physical Exam:  Vital signs in last 24 hours: Vitals:   02/22/20 0606 02/22/20 0700 02/22/20 0730 02/22/20 8250  BP: (!) 107/58 117/61 (!) 114/52 (!) 122/52  Pulse: (!) 113 (!) 104 (!) 107 (!) 104  Resp: (!) 24 (!) 24 (!) 25 (!) 24  Temp:      TempSrc:      SpO2: 99% 100% 100% 100%  Weight:      Height:         General:   Pleasant, cooperative in NAD Head:  Normocephalic and atraumatic. Eyes:   Scleral icterus.   Conjunctiva pink. PERRLA. Ears:  Normal auditory acuity. Neck:  Supple; no masses or thyroidomegaly Lungs: Respirations even and unlabored. Lungs clear to auscultation bilaterally.   No wheezes, crackles, or rhonchi.  Abdomen:  Soft, distended, positive fluid wave, nontender. Normal  bowel sounds. No appreciable masses or hepatomegaly.  No rebound or guarding.  Neurologic:  Alert and oriented x3;  grossly normal neurologically. Skin:  Intact without significant lesions or rashes. Cervical Nodes:  No significant cervical adenopathy. Psych:  Alert and cooperative. Normal affect.  LAB RESULTS: Recent Labs    02/20/20 0451 02/21/20 0513 02/22/20 0604  WBC 8.4 7.3 9.6  HGB 8.1* 7.8* 9.2*  HCT 24.5* 23.6* 27.0*  PLT 120* 109* 133*   BMET Recent Labs    02/20/20 0747 02/21/20 0513 02/22/20 0604  NA 123* 127* 123*  K 4.9 5.0 5.0  CL 93* 99 94*  CO2 18* 19* 24  GLUCOSE 85 86 79  BUN 39* 40* 38*  CREATININE 1.12 1.11 1.07  CALCIUM 8.3* 8.4* 8.6*   LFT Recent Labs    02/20/20 0451 02/21/20 0513 02/22/20 0604  PROT 6.6   < > 7.7  ALBUMIN 2.2*   < > 2.8*  AST 80*   < > 82*  ALT 50*   < > 54*  ALKPHOS 152*   < > 174*  BILITOT 10.6*   < > 12.3*  BILIDIR 4.0*  --   --   IBILI 6.6*  --   --    < > = values in this interval not displayed.   PT/INR Recent Labs    02/20/20 0451 02/22/20 0604  LABPROT 25.0* 22.3*  INR 2.4* 2.0*    STUDIES: US Paracentesis  Result Date: 02/20/2020 INDICATION: Patient with history of alcoholic cirrhosis, recurrent ascites; request received for diagnostic and therapeutic paracentesis. EXAM: ULTRASOUND GUIDED DIAGNOSTIC AND THERAPEUTIC PARACENTESIS MEDICATIONS: 1% lidocaine to skin and subcutaneous tissue COMPLICATIONS: None immediate. PROCEDURE: Informed written consent was obtained from the patient after a discussion of the risks, benefits and alternatives to treatment. A timeout was performed prior to the initiation of the procedure. Initial ultrasound scanning demonstrates a moderate to large amount of ascites within the right lower abdominal quadrant. The right lower abdomen was prepped and draped in the usual sterile fashion. 1% lidocaine was used for local anesthesia. Following this, a 19 gauge, 10-cm, Yueh catheter was  introduced. An ultrasound image was saved for documentation purposes. The paracentesis was performed. The catheter was removed and a dressing was applied. The patient tolerated the procedure well without immediate post procedural complication. FINDINGS: A total of approximately 3.6 liters of turbid, yelllow fluid was removed. Samples were sent to the laboratory as requested by the clinical team. Due to patient hypotension only the above amount of fluid was removed today. IMPRESSION: Successful ultrasound-guided diagnostic and therapeutic paracentesis yielding 3.6 liters of peritoneal fluid. Read by: Rowe Robert, PA-C Electronically Signed   By: Lucrezia Europe M.D.   On: 02/20/2020 11:09      Impression /  Plan:   Thorn Demas is a 34 y.o. y/o male with history of alcoholic cirrhosis, with chronically elevated bilirubin, recent abdominal imaging with CT scan in May 2021 available in care everywhere, with no evidence of biliary obstruction noted, with hematemesis for 1 day, with no prior upper endoscopy  Patient will need upper endoscopy for evaluation of hematemesis Differentials include variceal bleeding, Mallory-Weiss tear, peptic ulcer disease, esophagitis  Patient is hemodynamically stable His INR is elevated, and recommendations are to corrected to less than 1.5 given endoscopic hemostasis will be high risk procedure  I have discussed this with primary attending, Dr. Francine Graven, to prioritize FFP transfusion, correction of INR, to be able to proceed with endoscopy subsequently  Recommend FFP transfusion, vitamin K 10 g IV and repeat as needed  Diagnostic paracentesis done in the ER yesterday with 20 mL of fluid removed, does not show acute SBP on fluid cell count count.  Follow-up pending culture  Due to patient's history of cirrhosis, ascites, GI bleed he should receive SBP prophylaxis as well  PPI IV twice daily  Octreotide drip Continue serial CBCs and transfuse PRN Avoid NSAIDs Maintain 2  large-bore IV lines Please page GI with any acute hemodynamic changes, or signs of active GI bleeding  Would recommend therapeutic paracentesis as needed, just had one 2 days ago  Avoid hepatotoxic drugs Folate, thiamine recommended as well  Obtain right upper quadrant ultrasound with Doppler when feasible, but with bilirubin and alk phos being at baseline, with no acute elevation from baseline, unlikely to have biliary obstruction  Thank you for involving me in the care of this patient.      LOS: 0 days   Virgel Manifold, MD  02/22/2020, 10:04 AM

## 2020-02-22 NOTE — ED Triage Notes (Addendum)
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

## 2020-02-22 NOTE — Progress Notes (Signed)
Dr. Maximino Greenland placed five bands on the esophageal varices

## 2020-02-22 NOTE — Transfer of Care (Signed)
Immediate Anesthesia Transfer of Care Note  Patient: Bruce Bell  Procedure(s) Performed: ESOPHAGOGASTRODUODENOSCOPY (EGD) (N/A )  Patient Location: PACU  Anesthesia Type:General  Level of Consciousness: awake, alert  and oriented  Airway & Oxygen Therapy: Patient Spontanous Breathing and Patient connected to face mask oxygen  Post-op Assessment: Report given to RN and Post -op Vital signs reviewed and stable  Post vital signs: Reviewed and stable  Last Vitals:  Vitals Value Taken Time  BP    Temp    Pulse    Resp    SpO2      Last Pain:  Vitals:   02/22/20 1604  TempSrc:   PainSc: (P) 0-No pain      Patients Stated Pain Goal: 0 (02/22/20 1452)  Complications: No complications documented.

## 2020-02-22 NOTE — Progress Notes (Signed)
I discussed this patient with anesthesia, Dr. Darcey Nora.   Patient has history of decompensated cirrhosis with chronic hyponatremia.  Anesthesia was concerned with his low sodium, however looking back in his chart, even in Care Everywhere from June 2021, patient's sodium has always been under 130.  The source of his low sodium is his cirrhosis, therefore this is not something that can be quickly corrected  Given that he presents with hematemesis, with possible source being variceal bleeding, the patient can decompensate quickly if he in fact does have bleeding varices  He has been optimized at this point with FFP's and his INR is 1.7.  In patients with cirrhosis, coagulopathy is frequently present, and assessing bleeding risk based solely on INR is controversial. As per [employer]ate "In patients with cirrhosis, the INR is not an accurate measure of coagulation because it only reflects changes in procoagulant factors, and both procoagulant and anticoagulant factors are reduced".  FFP transfusion for correction of INR can be done as per literature and has been done.  If procedure is not done today, INR is likely to revert back to his baseline levels above 2 and this process will need to be repeated again.  In addition, with blood or blood product transfusions, fluid overload is also a risk.  Therefore, given his chronic history, chronic hyponatremia, chronically elevated INR, and now with active GI bleeding patient is at the best medically optimized status that he can likely be.  He also had paracentesis just 2 days ago with 3.6 L removed  I am afraid, if we wait any longer, we risk of worsening of clinical status and hemodynamic instability.   As per guidelines, upper endoscopy in the setting of upper GI bleed is recommended within 24 hours, and for suspected variceal bleeding within 12 hours of presentation.  Therefore, as per guidelines endoscopy would be best done now instead of waiting another day for  correction of hyponatremia.  Therefore, given all the above this is an urgent procedure and this was discussed with anesthesiologist as well  I discussed the risks associated with the procedure with the patient also, including the risk of increased bleeding with hemostasis, especially in the setting of variceal banding or treatment for the same.  However, not undergoing procedure for hemostasis, has high risk of decompensation as well.  Therefore given risks and benefits, benefits of procedure at this time would outweigh risks  Would also recommend primary team to treat fluid overload with paracentesis and diuretics as appropriate.  Sodium restriction to less than 2000 mg a day also recommended

## 2020-02-22 NOTE — Anesthesia Preprocedure Evaluation (Addendum)
Anesthesia Evaluation  Patient identified by MRN, date of birth, ID band Patient awake    Reviewed: Allergy & Precautions, NPO status , Patient's Chart, lab work & pertinent test results  History of Anesthesia Complications Negative for: history of anesthetic complications  Airway Mallampati: II  TM Distance: >3 FB Neck ROM: Full    Dental  (+) Teeth Intact, Poor Dentition, Dental Advisory Given   Pulmonary neg pulmonary ROS, neg sleep apnea, neg COPD, Patient abstained from smoking.Not current smoker,    Pulmonary exam normal breath sounds clear to auscultation       Cardiovascular Exercise Tolerance: Good METS(-) hypertension(-) CAD and (-) Past MI negative cardio ROS  (-) dysrhythmias  Rhythm:Regular Rate:Normal - Systolic murmurs    Neuro/Psych negative neurological ROS  negative psych ROS   GI/Hepatic neg GERD  ,(+) Cirrhosis   Esophageal Varices and ascites  (-) substance abuse  ,   Endo/Other  neg diabetes  Renal/GU negative Renal ROS     Musculoskeletal   Abdominal (+)  Abdomen: soft.    Peds  Hematology  (+) anemia ,   Anesthesia Other Findings Past Medical History: No date: Anemia No date: Cirrhosis ([facility]) No date: Hypervolemia No date: Hyponatremia No date: Liver cirrhosis ([facility])  Reproductive/Obstetrics                            Anesthesia Physical Anesthesia Plan  ASA: III and emergent  Anesthesia Plan: General   Post-op Pain Management:    Induction: Intravenous and Rapid sequence  PONV Risk Score and Plan: 2 and Ondansetron and Dexamethasone  Airway Management Planned: Oral ETT and Video Laryngoscope Planned  Additional Equipment: None  Intra-op Plan:   Post-operative Plan: Extubation in OR  Informed Consent: I have reviewed the patients History and Physical, chart, labs and discussed the procedure including the risks, benefits and alternatives for  the proposed anesthesia with the patient or authorized representative who has indicated his/her understanding and acceptance.     Dental advisory given  Plan Discussed with: CRNA and Surgeon  Anesthesia Plan Comments: (Upper GI bleeding, last vomiited last night. Urgent/emergent case per Dr Maximino Greenland (see her note). Will proceed despite patient's metabolic derangements (chronic hyponatremia, elevated INR).  Discussed risks of anesthesia with patient, including PONV, sore throat, lip/dental damage. Rare risks discussed as well, such as cardiorespiratory and neurological sequelae. Patient understands.)        Anesthesia Quick Evaluation

## 2020-02-22 NOTE — Anesthesia Procedure Notes (Signed)
Procedure Name: Intubation Date/Time: 02/22/2020 3:20 PM Performed by: Karoline Caldwell, CRNA Pre-anesthesia Checklist: Patient identified, Patient being monitored, Timeout performed, Emergency Drugs available and Suction available Patient Re-evaluated:Patient Re-evaluated prior to induction Oxygen Delivery Method: Circle system utilized Preoxygenation: Pre-oxygenation with 100% oxygen Induction Type: IV induction and Rapid sequence Laryngoscope Size: McGraph and 4 Grade View: Grade I Tube type: Oral Tube size: 7.0 mm Number of attempts: 1 Airway Equipment and Method: Stylet Placement Confirmation: ETT inserted through vocal cords under direct vision,  positive ETCO2 and breath sounds checked- equal and bilateral Secured at: 22 cm Tube secured with: Tape Dental Injury: Teeth and Oropharynx as per pre-operative assessment

## 2020-02-22 NOTE — ED Notes (Signed)
Report given to endoscopy. They states that they will get patient after receiving FFP.

## 2020-02-22 NOTE — ED Notes (Signed)
Pillow and warm blankets given to patient.

## 2020-02-22 NOTE — H&P (Addendum)
History and Physical    Bruce Bell LNL:[id number] DOB: 28-Jun-1985 DOA: 02/22/2020  PCP: [facility]   Patient coming from: Home  I have personally briefly reviewed patient's old medical records in [facility]  Chief Complaint: Vomiting blood  HPI: Bruce Bell is a 34 y.o. male with medical history significant for alcoholic liver cirrhosis as well as history of SBP on chronic antibiotic therapy with ciprofloxacin who recently moved from [place] last month to the area and was discharged from [facility] hospital about 24 hours prior to presenting to the [facility].  He was seen at [facility] for evaluation of decompensated liver cirrhosis with increasing abdominal distention and shortness of breath.   Patient underwent ultrasound-guided paracentesis while at [facility] long with drainage of about 3.6 L of fluid.  Further drainage was not done due to hypotension.  Patient had prior work-up done at [facility] and was negative for hepatitis B and C with positive hepatitis A IgG, negative ASMA/AMA.  Patient also noted to have a normal AFP level.  Patient was discharged in stable condition and advised to follow-up with GI as an outpatient for EGD to screen for esophageal varices.  He also had alpha-1 antitrypsin level as well ceruloplasmin levels ordered which are pending at the time of his discharge. Patient presented to the emergency room via EMS for evaluation of abdominal pain which she described as a diffuse, sharp pain mostly in the periumbilical area and rated 10 x 10 in intensity at its worst.  Onset of pain was about 10:30 PM and was associated with 2 episodes of emesis of bright red blood.  Patient denies having any melena stools.  He also reports a fever of 100 F at home. His vital signs upon arrival to the ER with a blood pressure 93/41, pulse rate 116, respiratory rate 32, temp 97.6 He denies having any chest pain, no shortness  of breath, no urinary symptoms, no headaches, no dizziness or lightheadedness. Labs show sodium of 123, potassium of 5, chloride of 94, bicarb 24, BUN 38, creatinine 1.07, calcium 8.6, alkaline phosphatase 174, albumin 2.8, AST 82, ALT 54, total protein 7.7, ammonia 25, total bilirubin 12.3, white count 9.6, hemoglobin 9.2, hematocrit 27, MCV 106, RDW 13.5, platelet count 133, PT 22.3, INR 2.0 Respiratory viral panel negative Peritoneal fluid analysis was hazy in color  showed about 277 neutrophils   ED Course: Patient is a 33 year old male with a history of alcoholic liver cirrhosis who presents to the emergency room for evaluation of diffuse abdominal pain associated with 2 episodes of hematemesis.  Patient is status post recent hospitalization for decompensated liver cirrhosis.  He received IV octreotide and Protonix in the ER as well as a dose of IV Rocephin for presumed SBP.  Peritoneal fluid was sent for further analysis.  Patient will be admitted to the hospital for further evaluation and treatment  Review of Systems: As per HPI otherwise review of systems negative.    Past Medical History:  Diagnosis Date  . Anemia   . Cirrhosis (HCC)   . Hypervolemia   . Hyponatremia   . Liver cirrhosis [facility])     Past Surgical History:  Procedure Laterality Date  . PARACENTESIS       reports that he has never smoked. He has never used smokeless tobacco. He reports previous alcohol use. He reports previous drug use.  No Known Allergies  Family History  Problem Relation Age of Onset  .  Liver disease Maternal Grandmother      Prior to Admission medications   Medication Sig Start Date End Date Taking? Authorizing Provider  ciprofloxacin (CIPRO) 500 MG tablet Take 500 mg by mouth daily.   Yes [provider]  furosemide (LASIX) 40 MG tablet Take 40 mg by mouth.   Yes [provider]  hydrOXYzine (ATARAX/VISTARIL) 25 MG tablet Take 1 tablet (25 mg total) by mouth 3 (three)  times daily as needed for anxiety. 02/21/20  Yes Rodolph Bonghompson, Daniel V, MD  lactulose (CHRONULAC) 10 GM/15ML solution Take 15 mLs by mouth in the morning, at noon, and at bedtime. 01/30/20  Yes [provider]  pantoprazole (PROTONIX) 40 MG tablet Take 40 mg by mouth daily.   Yes [provider]  spironolactone (ALDACTONE) 100 MG tablet Take 100 mg by mouth daily.   Yes [provider]    Physical Exam: Vitals:   02/22/20 0606 02/22/20 0700 02/22/20 0730 02/22/20 0752  BP: (!) 107/58 117/61 (!) 114/52 (!) 122/52  Pulse: (!) 113 (!) 104 (!) 107 (!) 104  Resp: (!) 24 (!) 24 (!) 25 (!) 24  Temp:      TempSrc:      SpO2: 99% 100% 100% 100%  Weight:      Height:         Vitals:   02/22/20 0606 02/22/20 0700 02/22/20 0730 02/22/20 0752  BP: (!) 107/58 117/61 (!) 114/52 (!) 122/52  Pulse: (!) 113 (!) 104 (!) 107 (!) 104  Resp: (!) 24 (!) 24 (!) 25 (!) 24  Temp:      TempSrc:      SpO2: 99% 100% 100% 100%  Weight:      Height:        Constitutional: NAD, alert and oriented x 3.  Chronically ill-appearing Eyes: PERRL, lids and conjunctivae icterus ENMT: Mucous membranes are moist.  Neck: normal, supple, no masses, no thyromegaly Respiratory: clear to auscultation bilaterally, no wheezing, no crackles. Normal respiratory effort. No accessory muscle use.  Cardiovascular: Regular rate and rhythm, no murmurs / rubs / gallops. No extremity edema. 2+ pedal pulses. No carotid bruits.  Abdomen: tenderness in the periumbilical area, no masses palpated. No hepatosplenomegaly. Bowel sounds positive.  Musculoskeletal: no clubbing / cyanosis. No joint deformity upper and lower extremities.  Skin: no rashes, lesions, ulcers.  Neurologic: No gross focal neurologic deficit. Psychiatric: Normal mood and affect.   Labs on Admission: I have personally reviewed following labs and imaging studies  CBC: Recent Labs  Lab 02/19/20 1646 02/20/20 0451 02/21/20 0513  02/22/20 0604  WBC 8.0 8.4 7.3 9.6  NEUTROABS  --   --   --  6.8  HGB 8.6* 8.1* 7.8* 9.2*  HCT 25.8* 24.5* 23.6* 27.0*  MCV 107.1* 108.4* 107.8* 106.3*  PLT 130* 120* 109* 133*   Basic Metabolic Panel: Recent Labs  Lab 02/19/20 1646 02/20/20 0451 02/20/20 0747 02/21/20 0513 02/22/20 0604  NA 122* 123* 123* 127* 123*  K 4.6 5.1 4.9 5.0 5.0  CL 96* 97* 93* 99 94*  CO2 18* 17* 18* 19* 24  GLUCOSE 124* 111* 85 86 79  BUN 37* 38* 39* 40* 38*  CREATININE 1.21 1.12 1.12 1.11 1.07  CALCIUM 8.2* 8.1* 8.3* 8.4* 8.6*   GFR: Estimated Creatinine Clearance: 95 mL/min (by C-G formula based on SCr of 1.07 mg/dL). Liver Function Tests: Recent Labs  Lab 02/19/20 1646 02/20/20 0451 02/21/20 0513 02/22/20 0604  AST 90* 80* 68* 82*  ALT  55* 50* 50* 54*  ALKPHOS 148* 152* 130* 174*  BILITOT 12.6* 10.6* 10.7* 12.3*  PROT 7.0 6.6 6.4* 7.7  ALBUMIN 2.3* 2.2* 2.3* 2.8*   Recent Labs  Lab 02/19/20 1646  LIPASE 76*   Recent Labs  Lab 02/22/20 0604  AMMONIA 25   Coagulation Profile: Recent Labs  Lab 02/19/20 1830 02/20/20 0451 02/22/20 0604  INR 2.2* 2.4* 2.0*   Cardiac Enzymes: No results for input(s): CKTOTAL, CKMB, CKMBINDEX, TROPONINI in the last 168 hours. BNP (last 3 results) No results for input(s): PROBNP in the last 8760 hours. HbA1C: No results for input(s): HGBA1C in the last 72 hours. CBG: No results for input(s): GLUCAP in the last 168 hours. Lipid Profile: No results for input(s): CHOL, HDL, LDLCALC, TRIG, CHOLHDL, LDLDIRECT in the last 72 hours. Thyroid Function Tests: No results for input(s): TSH, T4TOTAL, FREET4, T3FREE, THYROIDAB in the last 72 hours. Anemia Panel: No results for input(s): VITAMINB12, FOLATE, FERRITIN, TIBC, IRON, RETICCTPCT in the last 72 hours. Urine analysis:    Component Value Date/Time   COLORURINE AMBER (A) 02/19/2020 1646   APPEARANCEUR CLEAR 02/19/2020 1646   LABSPEC 1.020 02/19/2020 1646   PHURINE 5.5 02/19/2020 1646    GLUCOSEU NEGATIVE 02/19/2020 1646   HGBUR MODERATE (A) 02/19/2020 1646   BILIRUBINUR SMALL (A) 02/19/2020 1646   KETONESUR NEGATIVE 02/19/2020 1646   PROTEINUR NEGATIVE 02/19/2020 1646   NITRITE NEGATIVE 02/19/2020 1646   LEUKOCYTESUR TRACE (A) 02/19/2020 1646    Radiological Exams on Admission: US Paracentesis  Result Date: 02/20/2020 INDICATION: Patient with history of alcoholic cirrhosis, recurrent ascites; request received for diagnostic and therapeutic paracentesis. EXAM: ULTRASOUND GUIDED DIAGNOSTIC AND THERAPEUTIC PARACENTESIS MEDICATIONS: 1% lidocaine to skin and subcutaneous tissue COMPLICATIONS: None immediate. PROCEDURE: Informed written consent was obtained from the patient after a discussion of the risks, benefits and alternatives to treatment. A timeout was performed prior to the initiation of the procedure. Initial ultrasound scanning demonstrates a moderate to large amount of ascites within the right lower abdominal quadrant. The right lower abdomen was prepped and draped in the usual sterile fashion. 1% lidocaine was used for local anesthesia. Following this, a 19 gauge, 10-cm, Yueh catheter was introduced. An ultrasound image was saved for documentation purposes. The paracentesis was performed. The catheter was removed and a dressing was applied. The patient tolerated the procedure well without immediate post procedural complication. FINDINGS: A total of approximately 3.6 liters of turbid, yelllow fluid was removed. Samples were sent to the laboratory as requested by the clinical team. Due to patient hypotension only the above amount of fluid was removed today. IMPRESSION: Successful ultrasound-guided diagnostic and therapeutic paracentesis yielding 3.6 liters of peritoneal fluid. Read by: Jeananne Rama, PA-C Electronically Signed   By: Corlis Leak M.D.   On: 02/20/2020 11:09    EKG: Independently reviewed.   Assessment/Plan Principal Problem:   GI bleed Active Problems:    Hyponatremia   Decompensated liver disease (HCC)   SBP (spontaneous bacterial peritonitis) (HCC)      Acute GI bleed Patient with a history of liver cirrhosis who presents to the ER after 2 episodes of hematemesis containing bright red blood Patient has not been evaluated for varices We will obtain serial H&H Continue IV octreotide and Protonix We will request GI consult   Spontaneous bacterial peritonitis Patient with a history of decompensated liver cirrhosis secondary to alcohol who presents to the ER for evaluation of diffuse abdominal pain associated with hematemesis We will place patient on Rocephin  2 g IV daily Follow-up results of ascitic fluid culture    Hyponatremia Most likely secondary to volume overload as well as spironolactone use Hold spironolactone for now Place patient on a fluid restriction Monitor sodium levels    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  All questions and concerns have been addressed.  He verbalizes understanding and agrees with the plan. Disposition Plan: Back to previous home environment Consults called: Gastroenterology    Lucile Shutters MD [facility]     02/22/2020, 9:36 AM

## 2020-02-22 NOTE — ED Provider Notes (Signed)
[facility] Emergency Department Provider Note  ____________________________________________  Time seen: Approximately 6:32 AM  I have reviewed the triage vital signs and the nursing notes.   HISTORY  Chief Complaint Abdominal Pain   HPI Clem Wisenbaker is a 34 y.o. male the history of cirrhosis of the liver, alcohol abuse in remission for 12 months, SBP who presents for evaluation of abdominal pain.   Patient was discharged from [facility] yesterday after being admitted for decompensated cirrhosis, ascites, and paracentesis.  This evening started with abdominal pain, nausea and vomiting.  Patient is complaining of diffuse sharp moderate abdominal pain that started at 10:30 PM.  Vomited twice bright red blood.  Denies any melena.  Patient does not think he has a history of varices.  Not on blood thinners.  No chest pain or shortness of breath.  Patient reports that he had a fever of 100F at home.  Past Medical History:  Diagnosis Date   Anemia    Cirrhosis (HCC)    Hypervolemia    Hyponatremia    Liver cirrhosis [facility])     Patient Active Problem List   Diagnosis Date Noted   Pleural effusion on right 02/21/2020   SOB (shortness of breath)    Decompensated liver disease (HCC) 02/20/2020   Coagulopathy (HCC) 02/20/2020   Cirrhosis (HCC) 02/20/2020   Hyponatremia 02/19/2020    Past Surgical History:  Procedure Laterality Date   PARACENTESIS      Prior to Admission medications   Medication Sig Start Date End Date Taking? Authorizing Provider  ciprofloxacin (CIPRO) 500 MG tablet Take 500 mg by mouth daily.   Yes [provider]  furosemide (LASIX) 40 MG tablet Take 40 mg by mouth.   Yes [provider]  hydrOXYzine (ATARAX/VISTARIL) 25 MG tablet Take 1 tablet (25 mg total) by mouth 3 (three) times daily as needed for anxiety. 02/21/20  Yes Rodolph Bong, MD  lactulose (CHRONULAC) 10 GM/15ML solution Take 15 mLs by  mouth in the morning, at noon, and at bedtime. 01/30/20  Yes [provider]  pantoprazole (PROTONIX) 40 MG tablet Take 40 mg by mouth daily.   Yes [provider]  spironolactone (ALDACTONE) 100 MG tablet Take 100 mg by mouth daily.   Yes [provider]    Allergies Patient has no known allergies.  Family History  Problem Relation Age of Onset   Liver disease Maternal Grandmother     Social History Social History   Tobacco Use   Smoking status: Never Smoker   Smokeless tobacco: Never Used  Building services engineer Use: Never used  Substance Use Topics   Alcohol use: Not Currently   Drug use: Not Currently    Review of Systems  Constitutional: + fever. Eyes: Negative for visual changes. ENT: Negative for sore throat. Neck: No neck pain  Cardiovascular: Negative for chest pain. Respiratory: Negative for shortness of breath. Gastrointestinal: + abdominal pain, hematemesis. No diarrhea. Genitourinary: Negative for dysuria. Musculoskeletal: Negative for back pain. Skin: Negative for rash. Neurological: Negative for headaches, weakness or numbness. Psych: No SI or HI  ____________________________________________   PHYSICAL EXAM:  VITAL SIGNS: ED Triage Vitals  Enc Vitals Group     BP 02/22/20 0554 (!) 93/41     Pulse Rate 02/22/20 0554 (!) 116     Resp 02/22/20 0554 (!) 32     Temp 02/22/20 0554 97.6 F (36.4 C)     Temp Source 02/22/20 0554 Oral  SpO2 02/22/20 0550 98 %     Weight 02/22/20 0555 154 lb (69.9 kg)     Height 02/22/20 0555 5\' 8"  (1.727 m)     Head Circumference --      Peak Flow --      Pain Score 02/22/20 0554 9     Pain Loc --      Pain Edu? --      Excl. in GC? --     Constitutional: Alert and oriented, chronically ill-appearing.  HEENT:      Head: Normocephalic and atraumatic.         Eyes: Conjunctivae are normal. Sclera is icteric.       Mouth/Throat: Mucous membranes are moist.       Neck: Supple  with no signs of meningismus. Cardiovascular: Tachycardic with regular rhythm Respiratory: Tachypneic, no hypoxia, lungs clear to auscultation.   Gastrointestinal: Soft, mild diffuse tenderness, mildly distended with positive bowel sounds. No rebound or guarding. Musculoskeletal:  No edema, cyanosis, or erythema of extremities. Neurologic: Normal speech and language. Face is symmetric. Moving all extremities. No gross focal neurologic deficits are appreciated. Skin: Skin is warm, dry and intact. Jaundiced Psychiatric: Mood and affect are normal. Speech and behavior are normal.  ____________________________________________   LABS (all labs ordered are listed, but only abnormal results are displayed)  Labs Reviewed  CBC WITH DIFFERENTIAL/PLATELET - Abnormal; Notable for the following components:      Result Value   RBC 2.54 (*)    Hemoglobin 9.2 (*)    HCT 27.0 (*)    MCV 106.3 (*)    MCH 36.2 (*)    Platelets 133 (*)    Monocytes Absolute 1.2 (*)    Abs Immature Granulocytes 0.26 (*)    All other components within normal limits  COMPREHENSIVE METABOLIC PANEL - Abnormal; Notable for the following components:   Sodium 123 (*)    Chloride 94 (*)    BUN 38 (*)    Calcium 8.6 (*)    Albumin 2.8 (*)    AST 82 (*)    ALT 54 (*)    Alkaline Phosphatase 174 (*)    Total Bilirubin 12.3 (*)    All other components within normal limits  PROTIME-INR - Abnormal; Notable for the following components:   Prothrombin Time 22.3 (*)    INR 2.0 (*)    All other components within normal limits  BODY FLUID CULTURE  RESPIRATORY PANEL BY RT PCR (FLU A&B, COVID)  APTT  AMMONIA  BODY FLUID CELL COUNT WITH DIFFERENTIAL  TYPE AND SCREEN   ____________________________________________  EKG  ED ECG REPORT I, 02/24/20, the attending physician, personally viewed and interpreted this ECG.  Sinus tachycardia, rate of 113, prolonged QTC, normal axis, diffuse T wave flattening with no ST  elevations. ____________________________________________  RADIOLOGY  none  ____________________________________________   PROCEDURES  Procedure(s) performed:yes .1-3 Lead EKG Interpretation Performed by: Nita Sickle, MD Authorized by: Nita Sickle, MD     Interpretation: non-specific     ECG rate assessment: tachycardic     Rhythm: sinus tachycardia     Ectopy: none   .Paracentesis  Date/Time: 02/22/2020 6:37 AM Performed by: 02/24/2020, MD Authorized by: Nita Sickle, MD   Consent:    Consent obtained:  Verbal   Consent given by:  Patient   Risks discussed:  Bleeding, bowel perforation, infection and pain   Alternatives discussed:  Delayed treatment Pre-procedure details:    Procedure purpose:  Diagnostic  Preparation: Patient was prepped and draped in usual sterile fashion   Anesthesia (see MAR for exact dosages):    Anesthesia method:  Local infiltration   Local anesthetic:  Lidocaine 1% w/o epi Procedure details:    Needle gauge: 21.   Ultrasound guidance: yes     Puncture site:  L lower quadrant   Fluid removed amount:  20   Fluid appearance:  Yellow   Dressing:  4x4 sterile gauze Post-procedure details:    Patient tolerance of procedure:  Tolerated well, no immediate complications   Critical Care performed: yes  CRITICAL CARE Performed by: Nita Sickle  ?  Total critical care time: 40 min  Critical care time was exclusive of separately billable procedures and treating other patients.  Critical care was necessary to treat or prevent imminent or life-threatening deterioration.  Critical care was time spent personally by me on the following activities: development of treatment plan with patient and/or surrogate as well as nursing, discussions with consultants, evaluation of patient's response to treatment, examination of patient, obtaining history from patient or surrogate, ordering and performing treatments and  interventions, ordering and review of laboratory studies, ordering and review of radiographic studies, pulse oximetry and re-evaluation of patient's condition.  ____________________________________________   INITIAL IMPRESSION / ASSESSMENT AND PLAN / ED COURSE   34 y.o. male the history of cirrhosis of the liver, alcohol abuse in remission for 12 months, SBP who presents for evaluation of abdominal pain, hematemesis and fever since last night.  Patient is chronically ill-appearing, hypotensive and tachycardic, tachypneic, jaundice and scleral icterus, abdomen mildly distended with diffuse tenderness.  Bedside ultrasound showing large amount of ascites.  Differential diagnosis including SBP versus variceal bleed versus bacteremia versus Covid.  With diffusely tender abdomen less likely appendicitis or diverticulitis.  Diagnostic paracentesis performed per procedure note above.  Fluid analysis is pending.  Will start patient on Rocephin, octreotide bolus and drip, Protonix bolus and drip just in case patient has varices.  Unfortunately I am unable to find any results of previous endoscopies on epic.  Patient does have extensive records that were reviewed on epic from his previous hospital stays at [facility] and [place].  Will give IV morphine and Zofran for pain. Will check ammonia and basic labs.  Old medical records reviewed.  Patient placed on telemetry for close monitoring.  Anticipate admission to the hospitalist service.  _________________________ 7:08 AM on 02/22/2020 -----------------------------------------  Labs showing stable Hgb, normal ammonia, stable INR and hyponatremia. Ascites studies pending. Discussed with Dr. Mia Creek from GI. Will admit to Hospitalist service.     _____________________________________________ Please note:  Patient was evaluated in Emergency Department today for the symptoms described in the history of present illness. Patient was evaluated in the context of  the global COVID-19 pandemic, which necessitated consideration that the patient might be at risk for infection with the SARS-CoV-2 virus that causes COVID-19. Institutional protocols and algorithms that pertain to the evaluation of patients at risk for COVID-19 are in a state of rapid change based on information released by regulatory bodies including the CDC and federal and state organizations. These policies and algorithms were followed during the patient's care in the ED.  Some ED evaluations and interventions may be delayed as a result of limited staffing during the pandemic.   [place] Controlled Substance Database was reviewed by me. ____________________________________________   FINAL CLINICAL IMPRESSION(S) / ED DIAGNOSES   Final diagnoses:  Generalized abdominal pain  Decompensated hepatic cirrhosis (HCC)  Hematemesis with  nausea      NEW MEDICATIONS STARTED DURING THIS VISIT:  ED Discharge Orders    None       Note:  This document was prepared using Dragon voice recognition software and may include unintentional dictation errors.    Nita SickleVeronese, [place], MD 02/22/20 [phone] [id number]

## 2020-02-22 NOTE — Op Note (Signed)
[facility] Gastroenterology Patient Name: Bruce Bell Procedure Date: 02/22/2020 3:02 PM MRN: [id number] Account #: [id number] Date of Birth: December 16, 1985 Admit Type: Inpatient Age: 34 Room: [facility] ENDO ROOM 3 Gender: Male Note Status: Finalized Procedure:             Upper GI endoscopy Indications:           Hematemesis Providers:             Saanvika Vazques B. Maximino Greenland MD, MD Referring MD:          Sallye Lat Md, MD (Referring MD) Medicines:             Monitored Anesthesia Care Complications:         No immediate complications. Procedure:             Pre-Anesthesia Assessment:                        - The risks and benefits of the procedure and the                         sedation options and risks were discussed with the                         patient. All questions were answered and informed                         consent was obtained.                        - Patient identification and proposed procedure were                         verified prior to the procedure.                        - ASA Grade Assessment: IV - A patient with severe                         systemic disease that is a constant threat to life.                        After obtaining informed consent, the endoscope was                         passed under direct vision. Throughout the procedure,                         the patient's blood pressure, pulse, and oxygen                         saturations were monitored continuously. The Endoscope                         was introduced through the mouth, and advanced to the                         second part of duodenum. The upper GI endoscopy was  accomplished with ease. The patient tolerated the                         procedure well. Findings:      Three columns of grade II varices with stigmata of recent bleeding were       found in the distal esophagus,. Red wale signs were present. Five bands       were successfully  placed with complete eradication, resulting in       deflation of varices. There was no bleeding at the end of the procedure.      Mild portal hypertensive gastropathy was found in the gastric fundus.      The exam of the stomach was otherwise normal.      The duodenal bulb, second portion of the duodenum and third portion of       the duodenum were normal. Impression:            - Grade II esophageal varices with stigmata of recent                         bleeding. Completely eradicated. Banded.                        - Portal hypertensive gastropathy.                        - Normal.                        - No specimens collected. Recommendation:        - Return patient to hospital ward for ongoing care.                        - Continue present medications.                        - The findings and recommendations were discussed with                         the patient.                        - Continue Serial CBCs and transfuse PRN                        - The findings and recommendations were discussed with                         the patient.                        - Continue octreotide for 72 hrs from start time then                         stop                        - Continue PPI IV BID                        - If patient has recurrent bleeding, pt will need to  be transferred to tertiary facility for TIPS                         evaluation if not available by IR here.                        - Clear liquid diet for 24 hrs once patient awake and                         alert and no signs of hematemesis                        - Advance to soft diet after this and continue for 1                         week Procedure Code(s):     --- Professional ---                        [phone], Esophagogastroduodenoscopy, flexible,                         transoral; with band ligation of esophageal/gastric                         varices Diagnosis Code(s):     ---  Professional ---                        I85.01, Esophageal varices with bleeding                        K76.6, Portal hypertension                        K31.89, Other diseases of stomach and duodenum                        K92.0, Hematemesis CPT copyright 2019 American Medical Association. All rights reserved. The codes documented in this report are preliminary and upon coder review may  be revised to meet current compliance requirements.  Melodie Bouillon, MD Michel Bickers B. Maximino Greenland MD, MD 02/22/2020 4:08:28 PM This report has been signed electronically. Number of Addenda: 0 Note Initiated On: 02/22/2020 3:02 PM Estimated Blood Loss:  Estimated blood loss: none.      [facility]

## 2020-02-22 NOTE — ED Notes (Signed)
Admission MD at bedside.  

## 2020-02-22 NOTE — Anesthesia Postprocedure Evaluation (Signed)
Anesthesia Post Note  Patient: Bruce Bell  Procedure(s) Performed: ESOPHAGOGASTRODUODENOSCOPY (EGD) (N/A )  Patient location during evaluation: PACU Anesthesia Type: General Level of consciousness: awake and alert Pain management: pain level controlled Vital Signs Assessment: post-procedure vital signs reviewed and stable Respiratory status: spontaneous breathing, nonlabored ventilation, respiratory function stable and patient connected to nasal cannula oxygen Cardiovascular status: blood pressure returned to baseline and stable Postop Assessment: no apparent nausea or vomiting Anesthetic complications: no   No complications documented.   Last Vitals:  Vitals:   02/22/20 1618 02/22/20 1626  BP: (!) 120/54   Pulse: (!) 109 (!) 107  Resp: (!) 28 18  Temp: 36.5 C 36.4 C  SpO2: 92% 96%    Last Pain:  Vitals:   02/22/20 1626  TempSrc:   PainSc: 0-No pain                 Corinda Gubler

## 2020-02-23 ENCOUNTER — Encounter: Payer: Self-pay | Admitting: Gastroenterology

## 2020-02-23 DIAGNOSIS — E43 Unspecified severe protein-calorie malnutrition: Secondary | ICD-10-CM | POA: Insufficient documentation

## 2020-02-23 LAB — BPAM FFP
Blood Product Expiration Date: 202110192359
Blood Product Expiration Date: 202110192359
ISSUE DATE / TIME: 202110141154
ISSUE DATE / TIME: 202110141257
Unit Type and Rh: 7300
Unit Type and Rh: 7300

## 2020-02-23 LAB — PREPARE FRESH FROZEN PLASMA
Unit division: 0
Unit division: 0

## 2020-02-23 LAB — BASIC METABOLIC PANEL
Anion gap: 6 (ref 5–15)
BUN: 37 mg/dL — ABNORMAL HIGH (ref 6–20)
CO2: 19 mmol/L — ABNORMAL LOW (ref 22–32)
Calcium: 8 mg/dL — ABNORMAL LOW (ref 8.9–10.3)
Chloride: 96 mmol/L — ABNORMAL LOW (ref 98–111)
Creatinine, Ser: 1.11 mg/dL (ref 0.61–1.24)
GFR, Estimated: >60 mL/min (ref >60)
Glucose, Bld: 142 mg/dL — ABNORMAL HIGH (ref 70–99)
Potassium: 6 mmol/L — ABNORMAL HIGH (ref 3.5–5.1)
Sodium: 121 mmol/L — ABNORMAL LOW (ref 135–145)

## 2020-02-23 LAB — BODY FLUID CULTURE
Culture: NO GROWTH
Gram Stain: NONE SEEN

## 2020-02-23 LAB — ALPHA-1 ANTITRYPSIN PHENOTYPE: A-1 Antitrypsin, Ser: 130 mg/dL (ref 95–164)

## 2020-02-23 LAB — CBC
HCT: 21.4 % — ABNORMAL LOW (ref 39.0–52.0)
Hemoglobin: 7.2 g/dL — ABNORMAL LOW (ref 13.0–17.0)
MCH: 35.5 pg — ABNORMAL HIGH (ref 26.0–34.0)
MCHC: 33.6 g/dL (ref 30.0–36.0)
MCV: 105.4 fL — ABNORMAL HIGH (ref 80.0–100.0)
Platelets: 88 10*3/uL — ABNORMAL LOW (ref 150–400)
RBC: 2.03 MIL/uL — ABNORMAL LOW (ref 4.22–5.81)
RDW: 13.8 % (ref 11.5–15.5)
WBC: 4.8 10*3/uL (ref 4.0–10.5)
nRBC: 0 % (ref 0.0–0.2)

## 2020-02-23 LAB — HEMOGLOBIN AND HEMATOCRIT, BLOOD
HCT: 22.7 % — ABNORMAL LOW (ref 39.0–52.0)
Hemoglobin: 7.9 g/dL — ABNORMAL LOW (ref 13.0–17.0)

## 2020-02-23 MED ORDER — ADULT MULTIVITAMIN W/MINERALS CH
1.0000 | ORAL_TABLET | Freq: Every day | ORAL | Status: DC
  Administered 2020-02-24 – 2020-03-01 (×7): 1 via ORAL
  Filled 2020-02-23 (×7): qty 1

## 2020-02-23 MED ORDER — SODIUM ZIRCONIUM CYCLOSILICATE 10 G PO PACK
10.0000 g | PACK | Freq: Two times a day (BID) | ORAL | Status: DC
  Administered 2020-02-23 – 2020-02-25 (×6): 10 g via ORAL
  Filled 2020-02-23 (×9): qty 1

## 2020-02-23 MED ORDER — ENSURE ENLIVE PO LIQD
237.0000 mL | Freq: Three times a day (TID) | ORAL | Status: DC
  Administered 2020-02-23 – 2020-02-29 (×13): 237 mL via ORAL

## 2020-02-23 MED ORDER — CIPROFLOXACIN HCL 500 MG PO TABS
500.0000 mg | ORAL_TABLET | Freq: Every day | ORAL | Status: DC
  Administered 2020-02-24 – 2020-03-01 (×7): 500 mg via ORAL
  Filled 2020-02-23 (×7): qty 1

## 2020-02-23 MED ORDER — KETOROLAC TROMETHAMINE 15 MG/ML IJ SOLN
15.0000 mg | Freq: Four times a day (QID) | INTRAMUSCULAR | Status: DC | PRN
  Administered 2020-02-24 – 2020-02-25 (×2): 15 mg via INTRAVENOUS
  Filled 2020-02-23 (×2): qty 1

## 2020-02-23 MED ORDER — GUAIFENESIN ER 600 MG PO TB12
600.0000 mg | ORAL_TABLET | Freq: Two times a day (BID) | ORAL | Status: DC
  Administered 2020-02-23 – 2020-03-01 (×14): 600 mg via ORAL
  Filled 2020-02-23 (×15): qty 1

## 2020-02-23 MED ORDER — FUROSEMIDE 40 MG PO TABS
40.0000 mg | ORAL_TABLET | Freq: Every day | ORAL | Status: DC
  Administered 2020-02-23 – 2020-02-25 (×3): 40 mg via ORAL
  Filled 2020-02-23 (×4): qty 1

## 2020-02-23 MED ORDER — HYDROXYZINE HCL 25 MG PO TABS
25.0000 mg | ORAL_TABLET | Freq: Three times a day (TID) | ORAL | Status: DC | PRN
  Filled 2020-02-23: qty 1

## 2020-02-23 MED ORDER — HYDROCOD POLST-CPM POLST ER 10-8 MG/5ML PO SUER
5.0000 mL | Freq: Two times a day (BID) | ORAL | Status: DC | PRN
  Administered 2020-02-24: 5 mL via ORAL
  Filled 2020-02-23: qty 5

## 2020-02-23 NOTE — Progress Notes (Signed)
PROGRESS NOTE    Bruce Bell  QBH:[id number] DOB: [birth year] DOA: 02/22/2020 PCP: [facility]    Assessment & Plan:   Principal Problem:   GI bleed Active Problems:   Hyponatremia   Decompensated liver disease (HCC)   SBP (spontaneous bacterial peritonitis) (HCC)   Hematemesis with nausea   Secondary esophageal varices with bleeding (HCC)   Portal hypertensive gastropathy (HCC)   Protein-calorie malnutrition, severe   Bruce Bell is a 34 y.o. male with medical history significant for alcoholic liver cirrhosis as well as history of SBP on chronic antibiotic therapy with ciprofloxacin who recently moved from [place] last month to the area and was discharged from [facility] about 24 hours prior to presenting to the [facility].  He was seen at [facility] for evaluation of decompensated liver cirrhosis with increasing abdominal distention and shortness of breath.  Patient underwent ultrasound-guided paracentesis while at [facility] with drainage of about 3.6 L of fluid.  Further drainage was not done due to hypotension.  Patient had prior work-up done at [facility] and was negative for hepatitis B and C with positive hepatitis A IgG, negative ASMA/AMA.  Patient also noted to have a normal AFP level.  Patient was discharged in stable condition and advised to follow-up with GI as an outpatient for EGD to screen for esophageal varices.  He also had alpha-1 antitrypsin level as well ceruloplasmin levels ordered which are pending at the time of his discharge. Patient presented to the emergency room via EMS for evaluation of abdominal pain which she described as a diffuse, sharp pain mostly in the periumbilical area and rated 10 x 10 in intensity at its worst.  Onset of pain was about 10:30 PM and was associated with 2 episodes of emesis of bright red blood.     Hematemesis 2/2 esophageal varices with bleeding S/p EGD and  banding Patient with a history of liver cirrhosis who presents to the ER after 2 episodes of hematemesis containing bright red blood Patient has not been evaluated for varices previously --started on IV octreotide and protonix --EGD 10/14 PLAN: --cont octreotide gtt for 72 hours --IV PPI BID --Per GI, need to monitor as inpatient for 48-72 hours post procedure  Spontaneous bacterial peritonitis, ruled out --due to abdominal pain and ascites, pt was started on Rocephin 2 g IV daily on presentation. 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   coagulopathy and thrombocytopenia Recurrent ascites Portal HTN Patient has a meld score of 30 points with an estimated 52.6% 73-month mortality --continue home lasix  --resume aldactone as 50 mg daily --Continue lactulose --will avoid excess paracentesis   Dyspnea 2/2  Large right pleural effusion --CXR showed "Large right pleural effusion with worsened right upper lobe collapse."  PLAN: --Thoracentesis on Monday  Hyponatremia --2/2 cirhosis  Previous history of alcoholism Patient states that he has not had any alcohol in the last 1 year and is on a transplant list   DVT prophylaxis: SCD/Compression stockings Code Status: Full code  Family Communication: mother updated at the bedside today Status is: inpatient Dispo:   The patient is from: home Anticipated d/c is to: home Anticipated d/c date is: 2 days Patient currently is not medically stable to d/c due to: need thoracentesis on Monday   Subjective and Interval History:  Pt complained of abdominal pain earlier in the day, but improved  with pain meds.  Also reported some dyspnea.   Ascites cell count showed no SBP, so pt was switched back to ppx cipro.   Objective: Vitals:   02/24/20 0810 02/24/20 0900 02/24/20 1603 02/24/20 1910  BP: 112/74  (!) 107/59 (!) 108/56    Pulse: (!) 105  (!) 109 (!) 109  Resp: 20  20 20   Temp: 98.2 F (36.8 C)  97.9 F (36.6 C) 97.8 F (36.6 C)  TempSrc: Oral  Oral Oral  SpO2: 95% 94% 95% 94%  Weight:      Height:        Intake/Output Summary (Last 24 hours) at 02/24/2020 2045 Last data filed at 02/24/2020 1910 Gross per 24 hour  Intake 934.89 ml  Output 825 ml  Net 109.89 ml   Filed Weights   02/22/20 1227 02/22/20 1452 02/24/20 0443  Weight: 72.3 kg 72.1 kg 75.9 kg    Examination:   Constitutional: NAD, AAOx3 HEENT: conjunctivae icterus, lids normal, EOMI CV: No cyanosis.   RESP: shallow breaths GI: +BS, abdomen distended Extremities: No effusions, edema in BLE SKIN: warm, dry and intact Neuro: II - XII grossly intact.   Psych: Normal mood and affect.  Appropriate judgement and reason    Data Reviewed: I have personally reviewed following labs and imaging studies  CBC: Recent Labs  Lab 02/20/20 0451 02/20/20 0451 02/21/20 0513 02/21/20 0513 02/22/20 0604 02/22/20 1651 02/23/20 0512 02/23/20 1704 02/24/20 0553  WBC 8.4  --  7.3  --  9.6  --  4.8  --  8.8  NEUTROABS  --   --   --   --  6.8  --   --   --   --   HGB 8.1*   < > 7.8*   < > 9.2* 7.2* 7.2* 7.9* 7.2*  HCT 24.5*   < > 23.6*   < > 27.0* 21.2* 21.4* 22.7* 20.8*  MCV 108.4*  --  107.8*  --  106.3*  --  105.4*  --  104.5*  PLT 120*  --  109*  --  133*  --  88*  --  95*   < > = values in this interval not displayed.   Basic Metabolic Panel: Recent Labs  Lab 02/20/20 0747 02/21/20 0513 02/22/20 0604 02/23/20 0512 02/24/20 0553  NA 123* 127* 123* 121* 121*  K 4.9 5.0 5.0 6.0* 5.1  CL 93* 99 94* 96* 94*  CO2 18* 19* 24 19* 21*  GLUCOSE 85 86 79 142* 118*  BUN 39* 40* 38* 37* 35*  CREATININE 1.12 1.11 1.07 1.11 1.07  CALCIUM 8.3* 8.4* 8.6* 8.0* 7.9*  MG  --   --   --   --  2.1   GFR: Estimated Creatinine Clearance: 95 mL/min (by C-G formula based on SCr of 1.07 mg/dL). Liver Function Tests: Recent Labs  Lab  02/19/20 1646 02/20/20 0451 02/21/20 0513 02/22/20 0604  AST 90* 80* 68* 82*  ALT 55* 50* 50* 54*  ALKPHOS 148* 152* 130* 174*  BILITOT 12.6* 10.6* 10.7* 12.3*  PROT 7.0 6.6 6.4* 7.7  ALBUMIN 2.3* 2.2* 2.3* 2.8*   Recent Labs  Lab 02/19/20 1646  LIPASE 76*   Recent Labs  Lab 02/22/20 0604  AMMONIA 25   Coagulation Profile: Recent Labs  Lab 02/19/20 1830 02/20/20 0451 02/22/20 0604 02/22/20 1405  INR 2.2* 2.4* 2.0* 1.7*   Cardiac Enzymes: No results for input(s): CKTOTAL, CKMB, CKMBINDEX, TROPONINI in the last 168 hours. BNP (last  3 results) No results for input(s): PROBNP in the last 8760 hours. HbA1C: No results for input(s): HGBA1C in the last 72 hours. CBG: No results for input(s): GLUCAP in the last 168 hours. Lipid Profile: No results for input(s): CHOL, HDL, LDLCALC, TRIG, CHOLHDL, LDLDIRECT in the last 72 hours. Thyroid Function Tests: No results for input(s): TSH, T4TOTAL, FREET4, T3FREE, THYROIDAB in the last 72 hours. Anemia Panel: No results for input(s): VITAMINB12, FOLATE, FERRITIN, TIBC, IRON, RETICCTPCT in the last 72 hours. Sepsis Labs: No results for input(s): PROCALCITON, LATICACIDVEN in the last 168 hours.  Recent Results (from the past 240 hour(s))  Respiratory Panel by RT PCR (Flu A&B, Covid) - Nasopharyngeal Swab     Status: None   Collection Time: 02/19/20  6:04 PM   Specimen: Nasopharyngeal Swab  Result Value Ref Range Status   SARS Coronavirus 2 by RT PCR NEGATIVE NEGATIVE Final    Comment: (NOTE) SARS-CoV-2 target nucleic acids are NOT DETECTED.  The SARS-CoV-2 RNA is generally detectable in upper respiratoy specimens during the acute phase of infection. The lowest concentration of SARS-CoV-2 viral copies this assay can detect is 131 copies/mL. A negative result does not preclude SARS-Cov-2 infection and should not be used as the sole basis for treatment or other patient management decisions. A negative result may occur with   improper specimen collection/handling, submission of specimen other than nasopharyngeal swab, presence of viral mutation(s) within the areas targeted by this assay, and inadequate number of viral copies (<131 copies/mL). A negative result must be combined with clinical observations, patient history, and epidemiological information. The expected result is Negative.  Fact Sheet for Patients:  https://www.moore.com/  Fact Sheet for Healthcare Providers:  https://www.young.biz/  This test is no t yet approved or cleared by the [place] FDA and  has been authorized for detection and/or diagnosis of SARS-CoV-2 by FDA under an Emergency Use Authorization (EUA). This EUA will remain  in effect (meaning this test can be used) for the duration of the COVID-19 declaration under Section 564(b)(1) of the Act, 21 U.S.C. section 360bbb-3(b)(1), unless the authorization is terminated or revoked sooner.     Influenza A by PCR NEGATIVE NEGATIVE Final   Influenza B by PCR NEGATIVE NEGATIVE Final    Comment: (NOTE) The Xpert Xpress SARS-CoV-2/FLU/RSV assay is intended as an aid in  the diagnosis of influenza from Nasopharyngeal swab specimens and  should not be used as a sole basis for treatment. Nasal washings and  aspirates are unacceptable for Xpert Xpress SARS-CoV-2/FLU/RSV  testing.  Fact Sheet for Patients: https://www.moore.com/  Fact Sheet for Healthcare Providers: https://www.young.biz/  This test is not yet approved or cleared by the [place] FDA and  has been authorized for detection and/or diagnosis of SARS-CoV-2 by  FDA under an Emergency Use Authorization (EUA). This EUA will remain  in effect (meaning this test can be used) for the duration of the  Covid-19 declaration under Section 564(b)(1) of the Act, 21  U.S.C. section 360bbb-3(b)(1), unless the authorization is  terminated or  revoked. Performed at [facility], [address] [place], [place] [address]   Body fluid culture     Status: None   Collection Time: 02/20/20 11:11 AM   Specimen: PATH Cytology Peritoneal fluid  Result Value Ref Range Status   Specimen Description   Final    PERITONEAL Performed at [facility], [address] [place], [place] [address]    Special Requests   Final  NONE Performed at [facility], [address] [place], [place] [address]    Gram Stain NO WBC SEEN NO ORGANISMS SEEN CYTOSPIN SMEAR   Final   Culture   Final    NO GROWTH 3 DAYS Performed at [facility], [address] [place], [place] [address]    Report Status 02/23/2020 FINAL  Final  Culture, Urine     Status: Abnormal   Collection Time: 02/21/20  3:55 PM   Specimen: Urine, Clean Catch  Result Value Ref Range Status   Specimen Description   Final    URINE, CLEAN CATCH Performed at [facility], [address] [place], [place] [address]    Special Requests   Final    NONE Performed at [facility], [address] [place], [place] [address]    Culture MULTIPLE SPECIES PRESENT, SUGGEST RECOLLECTION (A)  Final   Report Status 02/22/2020 FINAL  Final  Body fluid culture     Status: None (Preliminary result)   Collection Time: 02/22/20  6:20 AM   Specimen: Peritoneal Washings; Body Fluid  Result Value Ref Range Status   Specimen Description   Final    PERITONEAL Performed at [facility], [address] [place], [place] [address]    Special Requests   Final    NONE Performed at [facility], [address] [place], [place] [address]    Gram Stain   Final    WBC PRESENT, PREDOMINANTLY MONONUCLEAR NO ORGANISMS SEEN CYTOSPIN SMEAR    Culture   Final    NO GROWTH 2 DAYS Performed at [facility], [address] [place], [place] [address]    Report Status PENDING   Incomplete  Respiratory Panel by RT PCR (Flu A&B, Covid) - Nasopharyngeal Swab     Status: None   Collection Time: 02/22/20  6:37 AM   Specimen: Nasopharyngeal Swab  Result Value Ref Range Status   SARS Coronavirus 2 by RT PCR NEGATIVE NEGATIVE Final    Comment: (NOTE) SARS-CoV-2 target nucleic acids are NOT DETECTED.  The SARS-CoV-2 RNA is generally detectable in upper respiratoy specimens during the acute phase of infection. The lowest concentration of SARS-CoV-2 viral copies this assay can detect is 131 copies/mL. A negative result does not preclude SARS-Cov-2 infection and should not be used as the sole basis for treatment or other patient management decisions. A negative result may occur with  improper specimen collection/handling, submission of specimen other than nasopharyngeal swab, presence of viral mutation(s) within the areas targeted by this assay, and inadequate number of viral copies (<131 copies/mL). A negative result must be combined with clinical observations, patient history, and epidemiological information. The expected result is Negative.  Fact Sheet for Patients:  https://www.moore.com/https://www.fda.gov/media/142436/download  Fact Sheet for Healthcare Providers:  https://www.young.biz/https://www.fda.gov/media/142435/download  This test is no t yet approved or cleared by the [place] FDA and  has been authorized for detection and/or diagnosis of SARS-CoV-2 by FDA under an Emergency Use Authorization (EUA). This EUA will remain  in effect (meaning this test can be used) for the duration of the COVID-19 declaration under Section 564(b)(1) of the Act, 21 U.S.C. section 360bbb-3(b)(1), unless the authorization is terminated or revoked sooner.     Influenza A by PCR NEGATIVE NEGATIVE Final   Influenza B by PCR NEGATIVE NEGATIVE Final    Comment: (NOTE) The Xpert Xpress SARS-CoV-2/FLU/RSV assay is intended as an aid in  the diagnosis of influenza from  Nasopharyngeal swab specimens and  should not  be used as a sole basis for treatment. Nasal washings and  aspirates are unacceptable for Xpert Xpress SARS-CoV-2/FLU/RSV  testing.  Fact Sheet for Patients: https://www.moore.com/  Fact Sheet for Healthcare Providers: https://www.young.biz/  This test is not yet approved or cleared by the [place] FDA and  has been authorized for detection and/or diagnosis of SARS-CoV-2 by  FDA under an Emergency Use Authorization (EUA). This EUA will remain  in effect (meaning this test can be used) for the duration of the  Covid-19 declaration under Section 564(b)(1) of the Act, 21  U.S.C. section 360bbb-3(b)(1), unless the authorization is  terminated or revoked. Performed at [facility], [address], [place], [place] [address]       Radiology Studies: No results found.   Scheduled Meds: . sodium chloride   Intravenous Once  . chlorhexidine  15 mL Mouth Rinse BID  . ciprofloxacin  500 mg Oral Daily  . feeding supplement  237 mL Oral TID BM  . furosemide  40 mg Oral Daily  . guaiFENesin  600 mg Oral BID  . lactulose  10 g Oral TID  . mouth rinse  15 mL Mouth Rinse q12n4p  . multivitamin with minerals  1 tablet Oral Daily  . [START ON 02/25/2020] pantoprazole  40 mg Intravenous Q12H  . sodium chloride flush  3 mL Intravenous Q12H  . sodium zirconium cyclosilicate  10 g Oral BID  . spironolactone  50 mg Oral Daily   Continuous Infusions: . sodium chloride    . octreotide  (SANDOSTATIN)    IV infusion 50 mcg/hr (02/24/20 1625)  . pantoprozole (PROTONIX) infusion 8 mg/hr (02/24/20 1745)     LOS: 2 days     Darlin Priestly, MD [facility] If 7PM-7AM, please contact night-coverage 02/24/2020, 8:45 PM

## 2020-02-23 NOTE — Progress Notes (Signed)
Pt verbalized relief of pain.

## 2020-02-23 NOTE — Progress Notes (Signed)
Initial Nutrition Assessment  DOCUMENTATION CODES:   Severe malnutrition in context of chronic illness  INTERVENTION:   Ensure Enlive po TID, each supplement provides 350 kcal and 20 grams of protein  MVI daily   2 gram sodium diet with advancement   NUTRITION DIAGNOSIS:   Severe Malnutrition related to chronic illness (cirrhosis) as evidenced by severe fat depletion, severe muscle depletion, 12 percent weight loss in 4 months.  GOAL:   Patient will meet greater than or equal to 90% of their needs  MONITOR:   PO intake, Supplement acceptance, Labs, Weight trends, Skin, I & O's  REASON FOR ASSESSMENT:   Malnutrition Screening Tool    ASSESSMENT:   34 y.o. male with history of cirrhosis of the liver, alcohol abuse in remission for 12 months, SBP who presents with abominal pain and GIB   Pt s/p EGD 10/14: found to have varices that were banded   Met with pt in room today. Pt reports good appetite and oral intake pta and in hospital; pt eating 100% of meals. Pt is very knowledgeable about his nutritional needs and asks a lot of good questions. Pt previously provided with low sodium diet education on 10/13. Pt reports that despite good appetite and oral intake, he has continued to loose weight. Per chart, pt is down 22lbs(12%) over the past 4 months; this is significant weight loss. RD discussed with pt the importance of adequate nutrition needed to preserve lean muscle. Pt has not yet started drinking any supplements but is willing to drink chocolate Ensure. RD will add supplements and MVI to help pt meet his estimated needs. Recommend 2g sodium diet once advanced.   Medications reviewed and include: ciprofloxacin, lasix, lactulose, protonix  Labs reviewed: Na 121(L), BUN 37(H) Hgb 7.2(L), Hct 21.4(L), MCV 105.4(H), MCH 35.5(H)  NUTRITION - FOCUSED PHYSICAL EXAM:    Most Recent Value  Orbital Region Moderate depletion  Upper Arm Region Severe depletion  Thoracic and  Lumbar Region Severe depletion  Buccal Region Severe depletion  Temple Region Severe depletion  Clavicle Bone Region Severe depletion  Clavicle and Acromion Bone Region Severe depletion  Scapular Bone Region Severe depletion  Dorsal Hand Severe depletion  Patellar Region Severe depletion  Anterior Thigh Region Severe depletion  Posterior Calf Region Severe depletion  Edema (RD Assessment) Mild  Hair Reviewed  Eyes Reviewed  Mouth Reviewed  Skin Reviewed  Nails Reviewed     Diet Order:   Diet Order            DIET SOFT Room service appropriate? Yes; Fluid consistency: Thin  Diet effective now                EDUCATION NEEDS:   Education needs have been addressed  Skin:  Skin Assessment: Reviewed RN Assessment  Last BM:  10/15  Height:   Ht Readings from Last 1 Encounters:  02/22/20 _0  (1.727 m)    Weight:   Wt Readings from Last 1 Encounters:  02/22/20 72.1 kg    Ideal Body Weight:  70 kg  BMI:  Body mass index is 24.18 kg/m.  Estimated Nutritional Needs:   Kcal:  2200-2500kcal/day  Protein:  115-125g/day  Fluid:  1.8L/day  Koleen Distance MS, RD, LDN Please refer to [facility] for RD and/or RD on-call/weekend/after hours pager

## 2020-02-23 NOTE — Progress Notes (Addendum)
Melodie Bouillon, MD [address], [address], [place], [place], [address] [address], [address], [place], [place], [address] Phone: [phone]  Fax: [phone]   Subjective: No further signs of active GI bleeding.  Patient doing well but yesterday.  Hemoglobin stable. no confusion   Objective: Exam: Vital signs in last 24 hours: Vitals:   02/22/20 1911 02/23/20 0412 02/23/20 0750 02/23/20 1208  BP: 126/72 129/78 131/65   Pulse: 95 93 95   Resp: 18 19 20    Temp: 98 F (36.7 C) 97.8 F (36.6 C) 98.2 F (36.8 C)   TempSrc: Oral Oral Oral   SpO2: 96% 97% 96% 96%  Weight:      Height:       Weight change: 2.495 kg  Intake/Output Summary (Last 24 hours) at 02/23/2020 1808 Last data filed at 02/23/2020 1505 Gross per 24 hour  Intake 2066.46 ml  Output 850 ml  Net 1216.46 ml    General: No acute distress, AAO x3 Abd: Soft, NT/ND, No HSM Skin: Warm, no rashes Neck: Supple, Trachea midline   Lab Results: Lab Results  Component Value Date   WBC 4.8 02/23/2020   HGB 7.9 (L) 02/23/2020   HCT 22.7 (L) 02/23/2020   MCV 105.4 (H) 02/23/2020   PLT 88 (L) 02/23/2020   Micro Results: Recent Results (from the past 240 hour(s))  Respiratory Panel by RT PCR (Flu A&B, Covid) - Nasopharyngeal Swab     Status: None   Collection Time: 02/19/20  6:04 PM   Specimen: Nasopharyngeal Swab  Result Value Ref Range Status   SARS Coronavirus 2 by RT PCR NEGATIVE NEGATIVE Final    Comment: (NOTE) SARS-CoV-2 target nucleic acids are NOT DETECTED.  The SARS-CoV-2 RNA is generally detectable in upper respiratoy specimens during the acute phase of infection. The lowest concentration of SARS-CoV-2 viral copies this assay can detect is 131 copies/mL. A negative result does not preclude SARS-Cov-2 infection and should not be used as the sole basis for treatment or other patient management decisions. A negative result may occur with  improper specimen collection/handling,  submission of specimen other than nasopharyngeal swab, presence of viral mutation(s) within the areas targeted by this assay, and inadequate number of viral copies (<131 copies/mL). A negative result must be combined with clinical observations, patient history, and epidemiological information. The expected result is Negative.  Fact Sheet for Patients:  04/20/20  Fact Sheet for Healthcare Providers:  https://www.moore.com/  This test is no t yet approved or cleared by the https://www.young.biz/ FDA and  has been authorized for detection and/or diagnosis of SARS-CoV-2 by FDA under an Emergency Use Authorization (EUA). This EUA will remain  in effect (meaning this test can be used) for the duration of the COVID-19 declaration under Section 564(b)(1) of the Act, 21 U.S.C. section 360bbb-3(b)(1), unless the authorization is terminated or revoked sooner.     Influenza A by PCR NEGATIVE NEGATIVE Final   Influenza B by PCR NEGATIVE NEGATIVE Final    Comment: (NOTE) The Xpert Xpress SARS-CoV-2/FLU/RSV assay is intended as an aid in  the diagnosis of influenza from Nasopharyngeal swab specimens and  should not be used as a sole basis for treatment. Nasal washings and  aspirates are unacceptable for Xpert Xpress SARS-CoV-2/FLU/RSV  testing.  Fact Sheet for Patients: [place]  Fact Sheet for Healthcare Providers: https://www.moore.com/  This test is not yet approved or cleared by the https://www.young.biz/ FDA and  has been authorized for detection and/or diagnosis of SARS-CoV-2  by  FDA under an Emergency Use Authorization (EUA). This EUA will remain  in effect (meaning this test can be used) for the duration of the  Covid-19 declaration under Section 564(b)(1) of the Act, 21  U.S.C. section 360bbb-3(b)(1), unless the authorization is  terminated or revoked. Performed at [facility], [address] [place], [place] [address]   Body fluid culture     Status: None   Collection Time: 02/20/20 11:11 AM   Specimen: PATH Cytology Peritoneal fluid  Result Value Ref Range Status   Specimen Description   Final    PERITONEAL Performed at [facility], [address], [place], [place] [address]    Special Requests   Final    NONE Performed at [facility], [address], [place], [place] [address]    Gram Stain NO WBC SEEN NO ORGANISMS SEEN CYTOSPIN SMEAR   Final   Culture   Final    NO GROWTH 3 DAYS Performed at [facility], [address] [place], [place] [address]    Report Status 02/23/2020 FINAL  Final  Culture, Urine     Status: Abnormal   Collection Time: 02/21/20  3:55 PM   Specimen: Urine, Clean Catch  Result Value Ref Range Status   Specimen Description   Final    URINE, CLEAN CATCH Performed at [facility], [address] [place], [place] [address]    Special Requests   Final    NONE Performed at [facility], [address], [place], [place] [address]    Culture MULTIPLE SPECIES PRESENT, SUGGEST RECOLLECTION (A)  Final   Report Status 02/22/2020 FINAL  Final  Body fluid culture     Status: None (Preliminary result)   Collection Time: 02/22/20  6:20 AM   Specimen: Peritoneal Washings; Body Fluid  Result Value Ref Range Status   Specimen Description   Final    PERITONEAL Performed at [facility], [address], [place], [place] [address]    Special Requests   Final    NONE Performed at [facility], [address] [place], [place] [address]    Gram Stain   Final    WBC PRESENT, PREDOMINANTLY MONONUCLEAR NO ORGANISMS SEEN CYTOSPIN SMEAR    Culture   Final    NO GROWTH < 24 HOURS Performed at [facility], [address], [place], [place] [address]    Report Status PENDING  Incomplete  Respiratory Panel by RT PCR (Flu A&B,  Covid) - Nasopharyngeal Swab     Status: None   Collection Time: 02/22/20  6:37 AM   Specimen: Nasopharyngeal Swab  Result Value Ref Range Status   SARS Coronavirus 2 by RT PCR NEGATIVE NEGATIVE Final    Comment: (NOTE) SARS-CoV-2 target nucleic acids are NOT DETECTED.  The SARS-CoV-2 RNA is generally detectable in upper respiratoy specimens during the acute phase of infection. The lowest concentration of SARS-CoV-2 viral copies this assay can detect is 131 copies/mL. A negative result does not preclude SARS-Cov-2 infection and should not be used as the sole basis for treatment or other patient management decisions. A negative result may occur with  improper specimen collection/handling, submission of specimen other than nasopharyngeal swab, presence of viral mutation(s) within the areas targeted by this assay, and inadequate number of viral copies (<131 copies/mL). A negative result must be combined with clinical observations, patient history, and epidemiological information. The expected result  is Negative.  Fact Sheet for Patients:  https://www.moore.com/  Fact Sheet for Healthcare Providers:  https://www.young.biz/  This test is no t yet approved or cleared by the [place] FDA and  has been authorized for detection and/or diagnosis of SARS-CoV-2 by FDA under an Emergency Use Authorization (EUA). This EUA will remain  in effect (meaning this test can be used) for the duration of the COVID-19 declaration under Section 564(b)(1) of the Act, 21 U.S.C. section 360bbb-3(b)(1), unless the authorization is terminated or revoked sooner.     Influenza A by PCR NEGATIVE NEGATIVE Final   Influenza B by PCR NEGATIVE NEGATIVE Final    Comment: (NOTE) The Xpert Xpress SARS-CoV-2/FLU/RSV assay is intended as an aid in  the diagnosis of influenza from Nasopharyngeal swab specimens and  should not be used as a sole basis for treatment. Nasal  washings and  aspirates are unacceptable for Xpert Xpress SARS-CoV-2/FLU/RSV  testing.  Fact Sheet for Patients: https://www.moore.com/  Fact Sheet for Healthcare Providers: https://www.young.biz/  This test is not yet approved or cleared by the [place] FDA and  has been authorized for detection and/or diagnosis of SARS-CoV-2 by  FDA under an Emergency Use Authorization (EUA). This EUA will remain  in effect (meaning this test can be used) for the duration of the  Covid-19 declaration under Section 564(b)(1) of the Act, 21  U.S.C. section 360bbb-3(b)(1), unless the authorization is  terminated or revoked. Performed at [facility], [address] [place], [place] [address]    Studies/Results: US Abdomen Limited RUQ  Result Date: 02/22/2020 CLINICAL DATA:  Liver cirrhosis. EXAM: ULTRASOUND ABDOMEN LIMITED RIGHT UPPER QUADRANT COMPARISON:  None. FINDINGS: Gallbladder: Large calcified gallstone measuring 4-5 mm. Gallbladder wall thickness upper normal at 2-3 mm. Sonographer reports no sonographic Murphy sign. Common bile duct: Diameter: 2-3 mm Liver: Nodular liver contour compatible with reported clinical history of cirrhosis. No focal parenchymal abnormality by ultrasound. Portal vein is patent on color Doppler imaging with normal direction of blood flow towards the liver. Other: Moderate to large volume ascites. Large right pleural effusion evident. IMPRESSION: 1. Nodular liver contour compatible with cirrhosis. No focal parenchymal abnormality by ultrasound. 2. Cholelithiasis. 3. Moderate to large volume ascites. 4. Large right pleural effusion. Electronically Signed   By: Kennith Center M.D.   On: 02/22/2020 10:43   Medications:  Scheduled Meds: . sodium chloride   Intravenous Once  . chlorhexidine  15 mL Mouth Rinse BID  . [START ON 02/24/2020] ciprofloxacin  500 mg Oral Daily  . feeding supplement  237 mL Oral TID BM  .  furosemide  40 mg Oral Daily  . lactulose  10 g Oral TID  . mouth rinse  15 mL Mouth Rinse q12n4p  . [START ON 02/24/2020] multivitamin with minerals  1 tablet Oral Daily  . [START ON 02/25/2020] pantoprazole  40 mg Intravenous Q12H  . sodium chloride flush  3 mL Intravenous Q12H  . sodium zirconium cyclosilicate  10 g Oral BID   Continuous Infusions: . sodium chloride    . octreotide  (SANDOSTATIN)    IV infusion 50 mcg/hr (02/23/20 1505)  . pantoprozole (PROTONIX) infusion 8 mg/hr (02/23/20 1615)   PRN Meds:.sodium chloride, hydrOXYzine, morphine injection, ondansetron **OR** ondansetron (ZOFRAN) IV, sodium chloride flush   Assessment: Principal Problem:   GI bleed Active Problems:   Hyponatremia   Decompensated liver disease (HCC)   SBP (spontaneous bacterial peritonitis) (HCC)   Hematemesis with nausea   Secondary esophageal varices with  bleeding (HCC)   Portal hypertensive gastropathy (HCC)   Protein-calorie malnutrition, severe    Plan: Patient hemodynamically stable No signs of active GI bleeding Continue octreotide drip and discontinue after total of 72 hours  Recommend monitoring as an inpatient for at least 48 to 72 hours post procedure prior to discharge  PPI IV twice daily  Continue serial CBCs and transfuse PRN Avoid NSAIDs Maintain 2 large-bore IV lines Please page GI with any acute hemodynamic changes, or signs of active GI bleeding  Recommend paracentesis as needed Sodium restriction to less than 2000 mg a day  Right upper quadrant ultrasound also revealed right pleural effusion.  I had discussed this with Dr. Joylene IgoAgbata yesterday, recommend work-up for this with possibly thoracentesis with studies to evaluate etiology. Likely from ascites  Folate, thiamine  DR. Toledo to follow from tonight for the weekend    LOS: 1 day   Melodie BouillonVarnita Moon Budde, MD 02/23/2020, 6:08 PM

## 2020-02-23 NOTE — Progress Notes (Signed)
During assessment pt asked for more pain med , said his pain is 7/10.  Morphine was recently given by day nurse.  Pt stated it did work a little and that his pain had initially been 9/10 and after Morphine it went to 7/10 but hasn't gotten any better than that and he feels unconfortable.  This sent in a secure chat to Dr. Doylene Canard.  See new orders.

## 2020-02-24 DIAGNOSIS — I8511 Secondary esophageal varices with bleeding: Secondary | ICD-10-CM

## 2020-02-24 LAB — BASIC METABOLIC PANEL
Anion gap: 6 (ref 5–15)
BUN: 35 mg/dL — ABNORMAL HIGH (ref 6–20)
CO2: 21 mmol/L — ABNORMAL LOW (ref 22–32)
Calcium: 7.9 mg/dL — ABNORMAL LOW (ref 8.9–10.3)
Chloride: 94 mmol/L — ABNORMAL LOW (ref 98–111)
Creatinine, Ser: 1.07 mg/dL (ref 0.61–1.24)
GFR, Estimated: >60 mL/min (ref >60)
Glucose, Bld: 118 mg/dL — ABNORMAL HIGH (ref 70–99)
Potassium: 5.1 mmol/L (ref 3.5–5.1)
Sodium: 121 mmol/L — ABNORMAL LOW (ref 135–145)

## 2020-02-24 LAB — CBC
HCT: 20.8 % — ABNORMAL LOW (ref 39.0–52.0)
Hemoglobin: 7.2 g/dL — ABNORMAL LOW (ref 13.0–17.0)
MCH: 36.2 pg — ABNORMAL HIGH (ref 26.0–34.0)
MCHC: 34.6 g/dL (ref 30.0–36.0)
MCV: 104.5 fL — ABNORMAL HIGH (ref 80.0–100.0)
Platelets: 95 10*3/uL — ABNORMAL LOW (ref 150–400)
RBC: 1.99 MIL/uL — ABNORMAL LOW (ref 4.22–5.81)
RDW: 14.3 % (ref 11.5–15.5)
WBC: 8.8 10*3/uL (ref 4.0–10.5)
nRBC: 0 % (ref 0.0–0.2)

## 2020-02-24 LAB — MAGNESIUM: Magnesium: 2.1 mg/dL (ref 1.7–2.4)

## 2020-02-24 MED ORDER — SPIRONOLACTONE 25 MG PO TABS
50.0000 mg | ORAL_TABLET | Freq: Every day | ORAL | Status: DC
  Administered 2020-02-24 – 2020-02-28 (×5): 50 mg via ORAL
  Filled 2020-02-24 (×5): qty 2

## 2020-02-24 NOTE — Progress Notes (Signed)
[facility] Inpatient Progress Note  Subjective: Cross covering GI service for Dr. Maximino Greenland. Patient eating well, has mild substernal discomfort but no nausea or vomiting.   Objective: Vital signs in last 24 hours: Temp:  [97.9 F (36.6 C)-98.3 F (36.8 C)] 97.9 F (36.6 C) (10/16 1603) Pulse Rate:  [105-110] 109 (10/16 1603) Resp:  [17-20] 20 (10/16 1603) BP: (107-125)/(59-74) 107/59 (10/16 1603) SpO2:  [94 %-97 %] 95 % (10/16 1603) Weight:  [75.9 kg] 75.9 kg (10/16 0443) Blood pressure (!) 107/59, pulse (!) 109, temperature 97.9 F (36.6 C), temperature source Oral, resp. rate 20, height 5\' 8"  (1.727 m), weight 75.9 kg, SpO2 95 %.    Intake/Output from previous day: 10/15 0701 - 10/16 0700 In: 2351.6 [P.O.:1347; I.V.:915.8; IV Piggyback:88.8] Out: 1175 [Urine:1175]  Intake/Output this shift: Total I/O In: 280.9 [I.V.:280.9] Out: -    General appearance:  Cachectic, peripheral musce wasting, protuberant abdomen Resp:  Few crackles, RLL Cardio:RRR no gallop GI: Distended, non-tense with fluid wave. Extremities:  Trace edema   Lab Results: Results for orders placed or performed during the hospital encounter of 02/22/20 (from the past 24 hour(s))  Hemoglobin and hematocrit, blood     Status: Abnormal   Collection Time: 02/23/20  5:04 PM  Result Value Ref Range   Hemoglobin 7.9 (L) 13.0 - 17.0 g/dL   HCT 02/25/20 (L) 39 - 52 %  Basic metabolic panel     Status: Abnormal   Collection Time: 02/24/20  5:53 AM  Result Value Ref Range   Sodium 121 (L) 135 - 145 mmol/L   Potassium 5.1 3.5 - 5.1 mmol/L   Chloride 94 (L) 98 - 111 mmol/L   CO2 21 (L) 22 - 32 mmol/L   Glucose, Bld 118 (H) 70 - 99 mg/dL   BUN 35 (H) 6 - 20 mg/dL   Creatinine, Ser 02/26/20 0.61 - 1.24 mg/dL   Calcium 7.9 (L) 8.9 - 10.3 mg/dL   GFR, Estimated 5.09 >32 mL/min   Anion gap 6 5 - 15  CBC     Status: Abnormal   Collection Time: 02/24/20  5:53 AM  Result Value Ref Range   WBC 8.8  4.0 - 10.5 K/uL   RBC 1.99 (L) 4.22 - 5.81 MIL/uL   Hemoglobin 7.2 (L) 13.0 - 17.0 g/dL   HCT 02/26/20 (L) 39 - 52 %   MCV 104.5 (H) 80.0 - 100.0 fL   MCH 36.2 (H) 26.0 - 34.0 pg   MCHC 34.6 30.0 - 36.0 g/dL   RDW 12.4 58.0 - 99.8 %   Platelets 95 (L) 150 - 400 K/uL   nRBC 0.0 0.0 - 0.2 %  Magnesium     Status: None   Collection Time: 02/24/20  5:53 AM  Result Value Ref Range   Magnesium 2.1 1.7 - 2.4 mg/dL     Recent Labs    02/26/20 0604 02/22/20 1651 02/23/20 0512 02/23/20 1704 02/24/20 0553  WBC 9.6  --  4.8  --  8.8  HGB 9.2*   < > 7.2* 7.9* 7.2*  HCT 27.0*   < > 21.4* 22.7* 20.8*  PLT 133*  --  88*  --  95*   < > = values in this interval not displayed.   BMET Recent Labs    02/22/20 0604 02/23/20 0512 02/24/20 0553  NA 123* 121* 121*  K 5.0 6.0* 5.1  CL 94* 96* 94*  CO2 24 19* 21*  GLUCOSE 79 142* 118*  BUN  38* 37* 35*  CREATININE 1.07 1.11 1.07  CALCIUM 8.6* 8.0* 7.9*   LFT Recent Labs    02/22/20 0604  PROT 7.7  ALBUMIN 2.8*  AST 82*  ALT 54*  ALKPHOS 174*  BILITOT 12.3*   PT/INR Recent Labs    02/22/20 0604 02/22/20 1405  LABPROT 22.3* 19.4*  INR 2.0* 1.7*   Hepatitis Panel No results for input(s): HEPBSAG, HCVAB, HEPAIGM, HEPBIGM in the last 72 hours. C-Diff No results for input(s): CDIFFTOX in the last 72 hours. No results for input(s): CDIFFPCR in the last 72 hours.   Studies/Results: No results found.  Scheduled Inpatient Medications:   . sodium chloride   Intravenous Once  . chlorhexidine  15 mL Mouth Rinse BID  . ciprofloxacin  500 mg Oral Daily  . feeding supplement  237 mL Oral TID BM  . furosemide  40 mg Oral Daily  . guaiFENesin  600 mg Oral BID  . lactulose  10 g Oral TID  . mouth rinse  15 mL Mouth Rinse q12n4p  . multivitamin with minerals  1 tablet Oral Daily  . [START ON 02/25/2020] pantoprazole  40 mg Intravenous Q12H  . sodium chloride flush  3 mL Intravenous Q12H  . sodium zirconium cyclosilicate  10 g Oral  BID  . spironolactone  50 mg Oral Daily    Continuous Inpatient Infusions:   . sodium chloride    . octreotide  (SANDOSTATIN)    IV infusion 50 mcg/hr (02/24/20 1625)  . pantoprozole (PROTONIX) infusion 8 mg/hr (02/24/20 1222)    PRN Inpatient Medications:  sodium chloride, chlorpheniramine-HYDROcodone, hydrOXYzine, ketorolac, morphine injection, ondansetron **OR** ondansetron (ZOFRAN) IV, sodium chloride flush    Assessment:  1. Esophageal variceal bleed s/p EGD with EVL x 5 - 02/22/2020 - Dr. Maximino Greenland. Patient responding well. On IV octreotide gtt. 2. Cirrhosis with MELD-Na score of 27 - Certainly worrisome for a young patient such as this.  3. Chronic pain. 4. Malnutrition 5. Anemia, acute on chronic, secondary to GI bleed noted above. Stable at 7.2. 6. Right pleural effusion, likely hepatic hydrothorax.   Plan:  1. Agree with current management. 2. Discontinue IV Octreotide. 3. Await thoracentesis.TBA. to rule out causes of effusion other than abdominal ascites. 4. Following.  Adi Doro K. Norma Fredrickson, M.D. 02/24/2020, 4:38 PM

## 2020-02-24 NOTE — Progress Notes (Signed)
PROGRESS NOTE    Bruce Bell  ENI:[id number] DOB: May 23, 1985 DOA: 02/22/2020 PCP: [facility]    Assessment & Plan:   Principal Problem:   GI bleed Active Problems:   Hyponatremia   Decompensated liver disease (HCC)   SBP (spontaneous bacterial peritonitis) (HCC)   Hematemesis with nausea   Secondary esophageal varices with bleeding (HCC)   Portal hypertensive gastropathy (HCC)   Protein-calorie malnutrition, severe   Bruce Bell is a 34 y.o. male with medical history significant for alcoholic liver cirrhosis as well as history of SBP on chronic antibiotic therapy with ciprofloxacin who recently moved from [place] last month to the area and was discharged from [facility] about 24 hours prior to presenting to the [facility].  He was seen at [facility] for evaluation of decompensated liver cirrhosis with increasing abdominal distention and shortness of breath.  Patient underwent ultrasound-guided paracentesis while at [facility] long with drainage of about 3.6 L of fluid.  Further drainage was not done due to hypotension.  Patient had prior work-up done at [facility] and was negative for hepatitis B and C with positive hepatitis A IgG, negative ASMA/AMA.  Patient also noted to have a normal AFP level.  Patient was discharged in stable condition and advised to follow-up with GI as an outpatient for EGD to screen for esophageal varices.  He also had alpha-1 antitrypsin level as well ceruloplasmin levels ordered which are pending at the time of his discharge. Patient presented to the emergency room via EMS for evaluation of abdominal pain which she described as a diffuse, sharp pain mostly in the periumbilical area and rated 10 x 10 in intensity at its worst.  Onset of pain was about 10:30 PM and was associated with 2 episodes of emesis of bright red blood.     Hematemesis 2/2 esophageal varices with bleeding S/p EGD and EVL  x5 on 02/22/20 Patient with a history of liver cirrhosis who presents to the ER after 2 episodes of hematemesis containing bright red blood Patient has not been evaluated for varices previously --started on IV octreotide and protonix --EGD 10/14 PLAN: --cont octreotide gtt, for 72 hours --IV PPI BID --Per GI, need to monitor as inpatient for 48-72 hours post procedure  Spontaneous bacterial peritonitis, ruled out --due to abdominal pain and ascites, pt was started on Rocephin 2 g IV daily on presentation. --ascitic fluid cell count neg for SBP, ceftriaxone d/c'ed. 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   coagulopathy and thrombocytopenia Recurrent ascites Portal HTN Patient has a meld score of 30 points with an estimated 52.6% 53-month mortality PLAN: --cont PO lasix 40 mg daily --continue aldactone as 50 mg daily --Continue lactulose --will avoid excess paracentesis   Dyspnea 2/2  Large right pleural effusion --CXR showed "Large right pleural effusion with worsened right upper lobe collapse."  PLAN: --Thoracentesis on Monday with fluid studies  Hyponatremia --2/2 cirhosis  Previous history of alcoholism Patient states that he has not had any alcohol in the last 1 year and is on a transplant list   DVT prophylaxis: SCD/Compression stockings Code Status: Full code  Family Communication: mother updated at the bedside today Status is: inpatient Dispo:   The patient is from: home Anticipated d/c is to: home Anticipated d/c date is: 2 days Patient currently is not medically stable to d/c due to: need thoracentesis on Monday   Subjective and Interval History:  Pt said  he was doing better because his mother was there with him.  Eating, without needing O2.  Abdominal pain improved.  Had large BM.   Objective: Vitals:   02/25/20 0410 02/25/20 0436 02/25/20 0620 02/25/20 0718  BP: (!) 87/46 104/70 105/65 (!) 106/36    Pulse: (!) 106  (!) 102 (!) 102  Resp:   18 18  Temp:   97.9 F (36.6 C) 98 F (36.7 C)  TempSrc:   Oral Oral  SpO2:   100% 92%  Weight:      Height:        Intake/Output Summary (Last 24 hours) at 02/25/2020 1013 Last data filed at 02/25/2020 0852 Gross per 24 hour  Intake 1392.68 ml  Output 502 ml  Net 890.68 ml   Filed Weights   02/22/20 1452 02/24/20 0443 02/24/20 1929  Weight: 72.1 kg 75.9 kg 79 kg    Examination:   Constitutional: NAD, AAOx3 HEENT: conjunctivae and lids normal, EOMI CV: No cyanosis.   RESP: normal respiratory effort, on RA GI: abdomen distended Extremities: No effusions, edema in BLE SKIN: warm, dry and intact Neuro: II - XII grossly intact.   Psych: Normal mood and affect.  Appropriate judgement and reason    Data Reviewed: I have personally reviewed following labs and imaging studies  CBC: Recent Labs  Lab 02/21/20 0513 02/21/20 0513 02/22/20 0604 02/22/20 0604 02/22/20 1651 02/23/20 0512 02/23/20 1704 02/24/20 0553 02/25/20 0404  WBC 7.3  --  9.6  --   --  4.8  --  8.8 15.3*  NEUTROABS  --   --  6.8  --   --   --   --   --   --   HGB 7.8*   < > 9.2*   < > 7.2* 7.2* 7.9* 7.2* 7.3*  HCT 23.6*   < > 27.0*   < > 21.2* 21.4* 22.7* 20.8* 21.6*  MCV 107.8*  --  106.3*  --   --  105.4*  --  104.5* 106.9*  PLT 109*  --  133*  --   --  88*  --  95* 110*   < > = values in this interval not displayed.   Basic Metabolic Panel: Recent Labs  Lab 02/21/20 0513 02/22/20 0604 02/23/20 0512 02/24/20 0553 02/25/20 0404  NA 127* 123* 121* 121* 122*  K 5.0 5.0 6.0* 5.1 4.9  CL 99 94* 96* 94* 94*  CO2 19* 24 19* 21* 23  GLUCOSE 86 79 142* 118* 93  BUN 40* 38* 37* 35* 48*  CREATININE 1.11 1.07 1.11 1.07 1.36*  CALCIUM 8.4* 8.6* 8.0* 7.9* 7.6*  MG  --   --   --  2.1 2.1   GFR: Estimated Creatinine Clearance: 74.7 mL/min (A) (by C-G formula based on SCr of 1.36 mg/dL (H)). Liver Function Tests: Recent Labs  Lab 02/19/20 1646  02/20/20 0451 02/21/20 0513 02/22/20 0604  AST 90* 80* 68* 82*  ALT 55* 50* 50* 54*  ALKPHOS 148* 152* 130* 174*  BILITOT 12.6* 10.6* 10.7* 12.3*  PROT 7.0 6.6 6.4* 7.7  ALBUMIN 2.3* 2.2* 2.3* 2.8*   Recent Labs  Lab 02/19/20 1646  LIPASE 76*   Recent Labs  Lab 02/22/20 0604  AMMONIA 25   Coagulation Profile: Recent Labs  Lab 02/19/20 1830 02/20/20 0451 02/22/20 0604 02/22/20 1405  INR 2.2* 2.4* 2.0* 1.7*   Cardiac Enzymes: No results for input(s): CKTOTAL, CKMB, CKMBINDEX, TROPONINI in the last 168 hours. BNP (last 3  results) No results for input(s): PROBNP in the last 8760 hours. HbA1C: No results for input(s): HGBA1C in the last 72 hours. CBG: No results for input(s): GLUCAP in the last 168 hours. Lipid Profile: No results for input(s): CHOL, HDL, LDLCALC, TRIG, CHOLHDL, LDLDIRECT in the last 72 hours. Thyroid Function Tests: No results for input(s): TSH, T4TOTAL, FREET4, T3FREE, THYROIDAB in the last 72 hours. Anemia Panel: No results for input(s): VITAMINB12, FOLATE, FERRITIN, TIBC, IRON, RETICCTPCT in the last 72 hours. Sepsis Labs: Recent Labs  Lab 02/25/20 0404  PROCALCITON 0.15    Recent Results (from the past 240 hour(s))  Respiratory Panel by RT PCR (Flu A&B, Covid) - Nasopharyngeal Swab     Status: None   Collection Time: 02/19/20  6:04 PM   Specimen: Nasopharyngeal Swab  Result Value Ref Range Status   SARS Coronavirus 2 by RT PCR NEGATIVE NEGATIVE Final    Comment: (NOTE) SARS-CoV-2 target nucleic acids are NOT DETECTED.  The SARS-CoV-2 RNA is generally detectable in upper respiratoy specimens during the acute phase of infection. The lowest concentration of SARS-CoV-2 viral copies this assay can detect is 131 copies/mL. A negative result does not preclude SARS-Cov-2 infection and should not be used as the sole basis for treatment or other patient management decisions. A negative result may occur with  improper specimen  collection/handling, submission of specimen other than nasopharyngeal swab, presence of viral mutation(s) within the areas targeted by this assay, and inadequate number of viral copies (<131 copies/mL). A negative result must be combined with clinical observations, patient history, and epidemiological information. The expected result is Negative.  Fact Sheet for Patients:  https://www.moore.com/  Fact Sheet for Healthcare Providers:  https://www.young.biz/  This test is no t yet approved or cleared by the [place] FDA and  has been authorized for detection and/or diagnosis of SARS-CoV-2 by FDA under an Emergency Use Authorization (EUA). This EUA will remain  in effect (meaning this test can be used) for the duration of the COVID-19 declaration under Section 564(b)(1) of the Act, 21 U.S.C. section 360bbb-3(b)(1), unless the authorization is terminated or revoked sooner.     Influenza A by PCR NEGATIVE NEGATIVE Final   Influenza B by PCR NEGATIVE NEGATIVE Final    Comment: (NOTE) The Xpert Xpress SARS-CoV-2/FLU/RSV assay is intended as an aid in  the diagnosis of influenza from Nasopharyngeal swab specimens and  should not be used as a sole basis for treatment. Nasal washings and  aspirates are unacceptable for Xpert Xpress SARS-CoV-2/FLU/RSV  testing.  Fact Sheet for Patients: https://www.moore.com/  Fact Sheet for Healthcare Providers: https://www.young.biz/  This test is not yet approved or cleared by the [place] FDA and  has been authorized for detection and/or diagnosis of SARS-CoV-2 by  FDA under an Emergency Use Authorization (EUA). This EUA will remain  in effect (meaning this test can be used) for the duration of the  Covid-19 declaration under Section 564(b)(1) of the Act, 21  U.S.C. section 360bbb-3(b)(1), unless the authorization is  terminated or revoked. Performed at [facility], [address] [place], [place] [address]   Body fluid culture     Status: None   Collection Time: 02/20/20 11:11 AM   Specimen: PATH Cytology Peritoneal fluid  Result Value Ref Range Status   Specimen Description   Final    PERITONEAL Performed at [facility], [address] [place], [place] [address]    Special Requests   Final  NONE Performed at [facility], [address], [place], [place] [address]    Gram Stain NO WBC SEEN NO ORGANISMS SEEN CYTOSPIN SMEAR   Final   Culture   Final    NO GROWTH 3 DAYS Performed at [facility], [address] [place], [place] [address]    Report Status 02/23/2020 FINAL  Final  Culture, Urine     Status: Abnormal   Collection Time: 02/21/20  3:55 PM   Specimen: Urine, Clean Catch  Result Value Ref Range Status   Specimen Description   Final    URINE, CLEAN CATCH Performed at [facility], [address] [place], [place] [address]    Special Requests   Final    NONE Performed at [facility], [address], [place], [place] [address]    Culture MULTIPLE SPECIES PRESENT, SUGGEST RECOLLECTION (A)  Final   Report Status 02/22/2020 FINAL  Final  Body fluid culture     Status: None   Collection Time: 02/22/20  6:20 AM   Specimen: Peritoneal Washings; Body Fluid  Result Value Ref Range Status   Specimen Description   Final    PERITONEAL Performed at [facility], [address] [place], [place] [address]    Special Requests   Final    NONE Performed at [facility], [address] [place], [place] [address]    Gram Stain   Final    WBC PRESENT, PREDOMINANTLY MONONUCLEAR NO ORGANISMS SEEN CYTOSPIN SMEAR    Culture   Final    NO GROWTH 3 DAYS Performed at [facility], [address], [place], [place] [address]    Report Status 02/25/2020 FINAL  Final  Respiratory Panel by RT PCR (Flu A&B,  Covid) - Nasopharyngeal Swab     Status: None   Collection Time: 02/22/20  6:37 AM   Specimen: Nasopharyngeal Swab  Result Value Ref Range Status   SARS Coronavirus 2 by RT PCR NEGATIVE NEGATIVE Final    Comment: (NOTE) SARS-CoV-2 target nucleic acids are NOT DETECTED.  The SARS-CoV-2 RNA is generally detectable in upper respiratoy specimens during the acute phase of infection. The lowest concentration of SARS-CoV-2 viral copies this assay can detect is 131 copies/mL. A negative result does not preclude SARS-Cov-2 infection and should not be used as the sole basis for treatment or other patient management decisions. A negative result may occur with  improper specimen collection/handling, submission of specimen other than nasopharyngeal swab, presence of viral mutation(s) within the areas targeted by this assay, and inadequate number of viral copies (<131 copies/mL). A negative result must be combined with clinical observations, patient history, and epidemiological information. The expected result is Negative.  Fact Sheet for Patients:  https://www.moore.com/  Fact Sheet for Healthcare Providers:  https://www.young.biz/  This test is no t yet approved or cleared by the [place] FDA and  has been authorized for detection and/or diagnosis of SARS-CoV-2 by FDA under an Emergency Use Authorization (EUA). This EUA will remain  in effect (meaning this test can be used) for the duration of the COVID-19 declaration under Section 564(b)(1) of the Act, 21 U.S.C. section 360bbb-3(b)(1), unless the authorization is terminated or revoked sooner.     Influenza A by PCR NEGATIVE NEGATIVE Final   Influenza B by PCR NEGATIVE NEGATIVE Final    Comment: (NOTE) The Xpert Xpress SARS-CoV-2/FLU/RSV assay is intended as an aid in  the diagnosis of influenza from Nasopharyngeal  swab specimens and  should not be used as a sole basis for treatment. Nasal  washings and  aspirates are unacceptable for Xpert Xpress SARS-CoV-2/FLU/RSV  testing.  Fact Sheet for Patients: https://www.moore.com/https://www.fda.gov/media/142436/download  Fact Sheet for Healthcare Providers: https://www.young.biz/https://www.fda.gov/media/142435/download  This test is not yet approved or cleared by the [place] FDA and  has been authorized for detection and/or diagnosis of SARS-CoV-2 by  FDA under an Emergency Use Authorization (EUA). This EUA will remain  in effect (meaning this test can be used) for the duration of the  Covid-19 declaration under Section 564(b)(1) of the Act, 21  U.S.C. section 360bbb-3(b)(1), unless the authorization is  terminated or revoked. Performed at [facility], [address] [place], [place] [address]       Radiology Studies: No results found.   Scheduled Meds: . sodium chloride   Intravenous Once  . chlorhexidine  15 mL Mouth Rinse BID  . ciprofloxacin  500 mg Oral Daily  . feeding supplement  237 mL Oral TID BM  . furosemide  40 mg Oral Daily  . guaiFENesin  600 mg Oral BID  . lactulose  10 g Oral TID  . mouth rinse  15 mL Mouth Rinse q12n4p  . multivitamin with minerals  1 tablet Oral Daily  . pantoprazole  40 mg Intravenous Q12H  . sodium chloride flush  3 mL Intravenous Q12H  . sodium zirconium cyclosilicate  10 g Oral BID  . spironolactone  50 mg Oral Daily   Continuous Infusions: . sodium chloride       LOS: 3 days     Darlin Priestlyina Manal Kreutzer, MD [facility] If 7PM-7AM, please contact night-coverage 02/25/2020, 10:13 AM

## 2020-02-25 DIAGNOSIS — K766 Portal hypertension: Secondary | ICD-10-CM

## 2020-02-25 DIAGNOSIS — K3189 Other diseases of stomach and duodenum: Secondary | ICD-10-CM

## 2020-02-25 LAB — CBC
HCT: 21.6 % — ABNORMAL LOW (ref 39.0–52.0)
Hemoglobin: 7.3 g/dL — ABNORMAL LOW (ref 13.0–17.0)
MCH: 36.1 pg — ABNORMAL HIGH (ref 26.0–34.0)
MCHC: 33.8 g/dL (ref 30.0–36.0)
MCV: 106.9 fL — ABNORMAL HIGH (ref 80.0–100.0)
Platelets: 110 10*3/uL — ABNORMAL LOW (ref 150–400)
RBC: 2.02 MIL/uL — ABNORMAL LOW (ref 4.22–5.81)
RDW: 14.4 % (ref 11.5–15.5)
WBC: 15.3 10*3/uL — ABNORMAL HIGH (ref 4.0–10.5)
nRBC: 0.2 % (ref 0.0–0.2)

## 2020-02-25 LAB — BODY FLUID CULTURE: Culture: NO GROWTH

## 2020-02-25 LAB — BASIC METABOLIC PANEL
Anion gap: 5 (ref 5–15)
BUN: 48 mg/dL — ABNORMAL HIGH (ref 6–20)
CO2: 23 mmol/L (ref 22–32)
Calcium: 7.6 mg/dL — ABNORMAL LOW (ref 8.9–10.3)
Chloride: 94 mmol/L — ABNORMAL LOW (ref 98–111)
Creatinine, Ser: 1.36 mg/dL — ABNORMAL HIGH (ref 0.61–1.24)
GFR, Estimated: >60 mL/min (ref >60)
Glucose, Bld: 93 mg/dL (ref 70–99)
Potassium: 4.9 mmol/L (ref 3.5–5.1)
Sodium: 122 mmol/L — ABNORMAL LOW (ref 135–145)

## 2020-02-25 LAB — PROCALCITONIN: Procalcitonin: 0.15 ng/mL

## 2020-02-25 LAB — MAGNESIUM: Magnesium: 2.1 mg/dL (ref 1.7–2.4)

## 2020-02-25 MED ORDER — HYDROCORTISONE 1 % EX CREA
1.0000 "application " | TOPICAL_CREAM | Freq: Every day | CUTANEOUS | Status: DC | PRN
  Filled 2020-02-25 (×2): qty 28

## 2020-02-25 MED ORDER — TRAMADOL HCL 50 MG PO TABS
50.0000 mg | ORAL_TABLET | Freq: Four times a day (QID) | ORAL | Status: DC | PRN
  Administered 2020-02-25 – 2020-03-01 (×3): 50 mg via ORAL
  Filled 2020-02-25 (×3): qty 1

## 2020-02-25 NOTE — Progress Notes (Signed)
[facility] GI progress note  Patient appears improved, tolerating PO. Thoracentesis scheduled for AM.  Vitals reviewed. Stable, afebrile.   Assessment:  1. Esophageal variceal bleed s/p EGD with EVL x 5 - 02/22/2020 - Dr. Maximino Greenland. Patient responding well. On IV octreotide gtt.  2. Cirrhosis with MELD-Na score of 27 - Certainly worrisome for a young patient such as this.  3. Chronic pain. 4. Malnutrition 5. Anemia, acute on chronic, secondary to GI bleed noted above. Stable at 7.2. 6. Right pleural effusion, likely hepatic hydrothorax.   Plan:  1. Agree with current management. 2. Discontinue IV Octreotide. 3. Await thoracentesis.tomorrow to rule out causes of effusion other than abdominal ascites. 4. Dr. Tobi Bastos to return to inpatient GI service tomorrow to resume management of patient.   George Ina, M.D. ABIM Diplomate in Gastroenterology [facility]

## 2020-02-25 NOTE — Progress Notes (Signed)
   02/25/20 0410  Assess: MEWS Score  BP (!) 87/46  Pulse Rate (!) 106  Assess: MEWS Score  MEWS Temp 0  MEWS Systolic 1  MEWS Pulse 1  MEWS RR 0  MEWS LOC 0  MEWS Score 2  MEWS Score Color Yellow  Assess: if the MEWS score is Yellow or Red  Were vital signs taken at a resting state? Yes  Focused Assessment No change from prior assessment  Early Detection of Sepsis Score *See Row Information* Low  MEWS guidelines implemented *See Row Information* Yes  Treat  MEWS Interventions Escalated (See documentation below)  Pain Scale 0-10  Pain Score 0  Take Vital Signs  Increase Vital Sign Frequency  Yellow: Q 2hr X 2 then Q 4hr X 2, if remains yellow, continue Q 4hrs  Escalate  MEWS: Escalate Yellow: discuss with charge nurse/RN and consider discussing with provider and RRT  Notify: Charge Nurse/RN  Name of Charge Nurse/RN Notified Heywood Footman  Date Charge Nurse/RN Notified 02/25/20  Time Charge Nurse/RN Notified 0423  Notify: Provider  Provider Name/Title Lindajo Royal, MD  Date Provider Notified 02/25/20  Time Provider Notified [phone]  Notification Type Page  Notification Reason Other (Comment) (Yellow Mews)  Response No new orders  Date of Provider Response 02/25/20  Time of Provider Response 0445  Document  Patient Outcome  (MEWS back to green w/ manual BP check)  Progress note created (see row info) Yes   Pt stable, will continue VS Q2hr x2 and then Q4 hr per protocol.  Lamonte Richer, RN

## 2020-02-25 NOTE — TOC Initial Note (Signed)
Transition of Care [facility]) - Initial/Assessment Note    Patient Details  Name: Bruce Bell MRN: [id number] Date of Birth: 22-Jul-1985  Transition of Care [facility]) CM/SW Contact:    Luvenia Redden, RN Phone Number: 02/25/2020, 12:17 PM  Clinical Narrative:     Spoke with pt today and completed readmission risk assessment and initial assessment. Pt reports he has a good support system and currently lives with his family, mom and Dad in a single family home. Pt has sufficient transportation for all medical appointments. Able to afford all prescribed medications and a sufficient amount of food upon his discharge.   RN further inquired on potential [facility] if indicated. Pt does not have a preference however receptive to insurance approval if this recommendation is presented.   TOC team will continue to follow for pending discharge plans.    Expected Discharge Plan: Home/Self CarePENDING Barriers to Discharge: Continued Medical Work up   Patient Goals and CMS Choice        Expected Discharge Plan and Services Expected Discharge Plan: Home/Self Care                         DME Arranged: N/A DME Agency: NA       HH Arranged: NA          Prior Living Arrangements/Services   Lives with:: Relatives, Parents Patient language and need for interpreter reviewed:: Yes Do you feel safe going back to the place where you live?: Yes      Need for Family Participation in Patient Care: No (Comment) Care giver support system in place?: Yes (comment)   Criminal Activity/Legal Involvement Pertinent to Current Situation/Hospitalization: No - Comment as needed  Activities of Daily Living Home Assistive Devices/Equipment: None ADL Screening (condition at time of admission) Patient's cognitive ability adequate to safely complete daily activities?: Yes Is the patient deaf or have difficulty hearing?: No Does the patient have difficulty seeing, even when wearing glasses/contacts?:  Yes Does the patient have difficulty concentrating, remembering, or making decisions?: No Patient able to express need for assistance with ADLs?: Yes Does the patient have difficulty dressing or bathing?: Yes Independently performs ADLs?: Yes (appropriate for developmental age) Does the patient have difficulty walking or climbing stairs?: Yes Weakness of Legs: Both Weakness of Arms/Hands: Both  Permission Sought/Granted                  Emotional Assessment Appearance:: Appears older than stated age Attitude/Demeanor/Rapport: Engaged Affect (typically observed): Stable, Accepting Orientation: : Oriented to Self, Oriented to Place, Oriented to  Time, Oriented to Situation   Psych Involvement: No (comment)  Admission diagnosis:  Liver cirrhosis (HCC) [K74.60] Generalized abdominal pain [R10.84] GI bleed [K92.2] Hematemesis with nausea [K92.0] Decompensated hepatic cirrhosis (HCC) [K72.90, K74.60] Patient Active Problem List   Diagnosis Date Noted  . Protein-calorie malnutrition, severe 02/23/2020  . GI bleed 02/22/2020  . SBP (spontaneous bacterial peritonitis) (HCC) 02/22/2020  . Hematemesis with nausea   . Secondary esophageal varices with bleeding (HCC)   . Portal hypertensive gastropathy (HCC)   . Pleural effusion on right 02/21/2020  . SOB (shortness of breath)   . Decompensated liver disease (HCC) 02/20/2020  . Coagulopathy (HCC) 02/20/2020  . Cirrhosis (HCC) 02/20/2020  . Hyponatremia 02/19/2020   PCP:  [facility] Pharmacy:   [facility] [address] [place], [place] - [address] [place] [address] Phone: [phone] Fax:  [phone]     Social Determinants of Health (SDOH) Interventions    Readmission Risk Interventions Readmission Risk Prevention Plan 02/25/2020  Transportation Screening Complete  PCP or Specialist Appt within 3-5 Days Complete  Palliative Care Screening Not Applicable   Medication Review (RN Care Manager) Complete

## 2020-02-25 NOTE — Progress Notes (Signed)
PROGRESS NOTE    Bruce Bell  SEG:[id number] DOB: 1986/03/15 DOA: 02/22/2020 PCP: [facility]    Assessment & Plan:   Principal Problem:   GI bleed Active Problems:   Hyponatremia   Decompensated liver disease (HCC)   SBP (spontaneous bacterial peritonitis) (HCC)   Hematemesis with nausea   Secondary esophageal varices with bleeding (HCC)   Portal hypertensive gastropathy (HCC)   Protein-calorie malnutrition, severe   Bruce Bell is a 34 y.o. male with medical history significant for alcoholic liver cirrhosis as well as history of SBP on chronic antibiotic therapy with ciprofloxacin who recently moved from [place] last month to the area and was discharged from [facility] about 24 hours prior to presenting to the [facility].  He was seen at [facility] for evaluation of decompensated liver cirrhosis with increasing abdominal distention and shortness of breath.  Patient underwent ultrasound-guided paracentesis while at [facility] with drainage of about 3.6 L of fluid.  Further drainage was not done due to hypotension.  Patient had prior work-up done at [facility] and was negative for hepatitis B and C with positive hepatitis A IgG, negative ASMA/AMA.  Patient also noted to have a normal AFP level.  Patient was discharged in stable condition and advised to follow-up with GI as an outpatient for EGD to screen for esophageal varices.  He also had alpha-1 antitrypsin level as well ceruloplasmin levels ordered which are pending at the time of his discharge. Patient presented to the emergency room via EMS for evaluation of abdominal pain which she described as a diffuse, sharp pain mostly in the periumbilical area and rated 10 x 10 in intensity at its worst.  Onset of pain was about 10:30 PM and was associated with 2 episodes of emesis of bright red blood.     Hematemesis 2/2 esophageal varices with bleeding S/p EGD and EVL  x5 on 02/22/20 Patient with a history of liver cirrhosis who presents to the ER after 2 episodes of hematemesis containing bright red blood Patient has not been evaluated for varices previously --started on IV octreotide and protonix --EGD 10/14 PLAN: --d/c octreotide gtt, after 72 hours --cont IV PPI BID for now  Spontaneous bacterial peritonitis, ruled out --due to abdominal pain and ascites, pt was started on Rocephin 2 g IV daily on presentation. --ascitic fluid cell count neg for SBP, ceftriaxone d/c'ed. 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   coagulopathy and thrombocytopenia Recurrent ascites Portal HTN Patient has a meld score of 30 points with an estimated 52.6% 62-month mortality PLAN: --cont PO lasix 40 mg daily --continue aldactone as 50 mg daily --Continue lactulose --will plan on paracentesis on Tuesday (avoid doing it on the same day as thoracentesis)  Dyspnea 2/2  Large right pleural effusion --CXR showed "Large right pleural effusion with worsened right upper lobe collapse."  PLAN: --US thoracentesis on Monday with fluid studies  Hyponatremia --2/2 cirhosis  Previous history of alcoholism Patient states that he has not had any alcohol in the last 1 year and hopes to be on transplant list   DVT prophylaxis: SCD/Compression stockings Code Status: Full code  Family Communication: Mother updated today with iPad translator Status is: inpatient Dispo:   The patient is from: home Anticipated d/c is to: home Anticipated d/c date is: Tuesday Patient currently is not medically stable to d/c due to: need thoracentesis on Monday and paracentesis on Tuesday.   Subjective  and Interval History:  Pt is off O2, though does appear to need some work for breathing.  Reported increased abdominal girth.  Normal oral intake.  Having large solid BM's.   Objective: Vitals:   02/25/20 1114 02/25/20 1243 02/25/20 1244  02/25/20 1245  BP: (!) 102/37 (!) 100/44 (!) 100/44 (!) 100/44  Pulse: (!) 103 100 99 99  Resp: Temp: 97.8 F (36.6 C)   98.3 F (36.8 C)  TempSrc: Oral   Oral  SpO2: 95% 93% 93%   Weight:      Height:        Intake/Output Summary (Last 24 hours) at 02/25/2020 1712 Last data filed at 02/25/2020 0852 Gross per 24 hour  Intake 1111.82 ml  Output 502 ml  Net 609.82 ml   Filed Weights   02/22/20 1452 02/24/20 0443 02/24/20 1929  Weight: 72.1 kg 75.9 kg 79 kg    Examination:   Constitutional: NAD, AAOx3 HEENT: conjunctivae icterus, lids normal, EOMI CV: No cyanosis.   RESP: some work of breathing, on RA GI: distended abdomen  Extremities: No effusions, edema in BLE SKIN: warm, dry and intact Neuro: II - XII grossly intact.   Psych: Normal mood and affect.  Appropriate judgement and reason   Data Reviewed: I have personally reviewed following labs and imaging studies  CBC: Recent Labs  Lab 02/21/20 0513 02/21/20 0513 02/22/20 0604 02/22/20 0604 02/22/20 1651 02/23/20 0512 02/23/20 1704 02/24/20 0553 02/25/20 0404  WBC 7.3  --  9.6  --   --  4.8  --  8.8 15.3*  NEUTROABS  --   --  6.8  --   --   --   --   --   --   HGB 7.8*   < > 9.2*   < > 7.2* 7.2* 7.9* 7.2* 7.3*  HCT 23.6*   < > 27.0*   < > 21.2* 21.4* 22.7* 20.8* 21.6*  MCV 107.8*  --  106.3*  --   --  105.4*  --  104.5* 106.9*  PLT 109*  --  133*  --   --  88*  --  95* 110*   < > = values in this interval not displayed.   Basic Metabolic Panel: Recent Labs  Lab 02/21/20 0513 02/22/20 0604 02/23/20 0512 02/24/20 0553 02/25/20 0404  NA 127* 123* 121* 121* 122*  K 5.0 5.0 6.0* 5.1 4.9  CL 99 94* 96* 94* 94*  CO2 19* 24 19* 21* 23  GLUCOSE 86 79 142* 118* 93  BUN 40* 38* 37* 35* 48*  CREATININE 1.11 1.07 1.11 1.07 1.36*  CALCIUM 8.4* 8.6* 8.0* 7.9* 7.6*  MG  --   --   --  2.1 2.1   GFR: Estimated Creatinine Clearance: 74.7 mL/min (A) (by C-G formula based on SCr of 1.36 mg/dL  (H)). Liver Function Tests: Recent Labs  Lab 02/19/20 1646 02/20/20 0451 02/21/20 0513 02/22/20 0604  AST 90* 80* 68* 82*  ALT 55* 50* 50* 54*  ALKPHOS 148* 152* 130* 174*  BILITOT 12.6* 10.6* 10.7* 12.3*  PROT 7.0 6.6 6.4* 7.7  ALBUMIN 2.3* 2.2* 2.3* 2.8*   Recent Labs  Lab 02/19/20 1646  LIPASE 76*   Recent Labs  Lab 02/22/20 0604  AMMONIA 25   Coagulation Profile: Recent Labs  Lab 02/19/20 1830 02/20/20 0451 02/22/20 0604 02/22/20 1405  INR 2.2* 2.4* 2.0* 1.7*   Cardiac Enzymes: No results for input(s): CKTOTAL, CKMB, CKMBINDEX, TROPONINI in  the last 168 hours. BNP (last 3 results) No results for input(s): PROBNP in the last 8760 hours. HbA1C: No results for input(s): HGBA1C in the last 72 hours. CBG: No results for input(s): GLUCAP in the last 168 hours. Lipid Profile: No results for input(s): CHOL, HDL, LDLCALC, TRIG, CHOLHDL, LDLDIRECT in the last 72 hours. Thyroid Function Tests: No results for input(s): TSH, T4TOTAL, FREET4, T3FREE, THYROIDAB in the last 72 hours. Anemia Panel: No results for input(s): VITAMINB12, FOLATE, FERRITIN, TIBC, IRON, RETICCTPCT in the last 72 hours. Sepsis Labs: Recent Labs  Lab 02/25/20 0404  PROCALCITON 0.15    Recent Results (from the past 240 hour(s))  Respiratory Panel by RT PCR (Flu A&B, Covid) - Nasopharyngeal Swab     Status: None   Collection Time: 02/19/20  6:04 PM   Specimen: Nasopharyngeal Swab  Result Value Ref Range Status   SARS Coronavirus 2 by RT PCR NEGATIVE NEGATIVE Final    Comment: (NOTE) SARS-CoV-2 target nucleic acids are NOT DETECTED.  The SARS-CoV-2 RNA is generally detectable in upper respiratoy specimens during the acute phase of infection. The lowest concentration of SARS-CoV-2 viral copies this assay can detect is 131 copies/mL. A negative result does not preclude SARS-Cov-2 infection and should not be used as the sole basis for treatment or other patient management decisions. A  negative result may occur with  improper specimen collection/handling, submission of specimen other than nasopharyngeal swab, presence of viral mutation(s) within the areas targeted by this assay, and inadequate number of viral copies (<131 copies/mL). A negative result must be combined with clinical observations, patient history, and epidemiological information. The expected result is Negative.  Fact Sheet for Patients:  https://www.moore.com/  Fact Sheet for Healthcare Providers:  https://www.young.biz/  This test is no t yet approved or cleared by the [place] FDA and  has been authorized for detection and/or diagnosis of SARS-CoV-2 by FDA under an Emergency Use Authorization (EUA). This EUA will remain  in effect (meaning this test can be used) for the duration of the COVID-19 declaration under Section 564(b)(1) of the Act, 21 U.S.C. section 360bbb-3(b)(1), unless the authorization is terminated or revoked sooner.     Influenza A by PCR NEGATIVE NEGATIVE Final   Influenza B by PCR NEGATIVE NEGATIVE Final    Comment: (NOTE) The Xpert Xpress SARS-CoV-2/FLU/RSV assay is intended as an aid in  the diagnosis of influenza from Nasopharyngeal swab specimens and  should not be used as a sole basis for treatment. Nasal washings and  aspirates are unacceptable for Xpert Xpress SARS-CoV-2/FLU/RSV  testing.  Fact Sheet for Patients: https://www.moore.com/  Fact Sheet for Healthcare Providers: https://www.young.biz/  This test is not yet approved or cleared by the [place] FDA and  has been authorized for detection and/or diagnosis of SARS-CoV-2 by  FDA under an Emergency Use Authorization (EUA). This EUA will remain  in effect (meaning this test can be used) for the duration of the  Covid-19 declaration under Section 564(b)(1) of the Act, 21  U.S.C. section 360bbb-3(b)(1), unless the authorization  is  terminated or revoked. Performed at [facility], [address] [place], [place] [address]   Body fluid culture     Status: None   Collection Time: 02/20/20 11:11 AM   Specimen: PATH Cytology Peritoneal fluid  Result Value Ref Range Status   Specimen Description   Final    PERITONEAL Performed at [facility], [address] [place], [place] [address]  Special Requests   Final    NONE Performed at [facility], [address] [place], [place] [address]    Gram Stain NO WBC SEEN NO ORGANISMS SEEN CYTOSPIN SMEAR   Final   Culture   Final    NO GROWTH 3 DAYS Performed at [facility], [address] [place], [place] [address]    Report Status 02/23/2020 FINAL  Final  Culture, Urine     Status: Abnormal   Collection Time: 02/21/20  3:55 PM   Specimen: Urine, Clean Catch  Result Value Ref Range Status   Specimen Description   Final    URINE, CLEAN CATCH Performed at [facility], [address] [place], [place] [address]    Special Requests   Final    NONE Performed at [facility], [address] [place], [place] [address]    Culture MULTIPLE SPECIES PRESENT, SUGGEST RECOLLECTION (A)  Final   Report Status 02/22/2020 FINAL  Final  Body fluid culture     Status: None   Collection Time: 02/22/20  6:20 AM   Specimen: Peritoneal Washings; Body Fluid  Result Value Ref Range Status   Specimen Description   Final    PERITONEAL Performed at [facility], [address] [place], [place] [address]    Special Requests   Final    NONE Performed at [facility], [address] [place], [place] [address]    Gram Stain   Final    WBC PRESENT, PREDOMINANTLY MONONUCLEAR NO ORGANISMS SEEN CYTOSPIN SMEAR    Culture   Final    NO GROWTH 3 DAYS Performed at [facility], [address] [place], [place] [address]    Report Status 02/25/2020 FINAL   Final  Respiratory Panel by RT PCR (Flu A&B, Covid) - Nasopharyngeal Swab     Status: None   Collection Time: 02/22/20  6:37 AM   Specimen: Nasopharyngeal Swab  Result Value Ref Range Status   SARS Coronavirus 2 by RT PCR NEGATIVE NEGATIVE Final    Comment: (NOTE) SARS-CoV-2 target nucleic acids are NOT DETECTED.  The SARS-CoV-2 RNA is generally detectable in upper respiratoy specimens during the acute phase of infection. The lowest concentration of SARS-CoV-2 viral copies this assay can detect is 131 copies/mL. A negative result does not preclude SARS-Cov-2 infection and should not be used as the sole basis for treatment or other patient management decisions. A negative result may occur with  improper specimen collection/handling, submission of specimen other than nasopharyngeal swab, presence of viral mutation(s) within the areas targeted by this assay, and inadequate number of viral copies (<131 copies/mL). A negative result must be combined with clinical observations, patient history, and epidemiological information. The expected result is Negative.  Fact Sheet for Patients:  https://www.moore.com/https://www.fda.gov/media/142436/download  Fact Sheet for Healthcare Providers:  https://www.young.biz/https://www.fda.gov/media/142435/download  This test is no t yet approved or cleared by the [place] FDA and  has been authorized for detection and/or diagnosis of SARS-CoV-2 by FDA under an Emergency Use Authorization (EUA). This EUA will remain  in effect (meaning this test can be used) for the duration of the COVID-19 declaration under Section 564(b)(1) of the Act, 21 U.S.C. section 360bbb-3(b)(1), unless the authorization is terminated or revoked sooner.     Influenza A by PCR NEGATIVE NEGATIVE Final   Influenza B by PCR NEGATIVE NEGATIVE Final    Comment: (NOTE) The Xpert Xpress SARS-CoV-2/FLU/RSV assay is intended as an aid  in  the diagnosis of influenza from Nasopharyngeal swab specimens and  should not be  used as a sole basis for treatment. Nasal washings and  aspirates are unacceptable for Xpert Xpress SARS-CoV-2/FLU/RSV  testing.  Fact Sheet for Patients: https://www.moore.com/  Fact Sheet for Healthcare Providers: https://www.young.biz/  This test is not yet approved or cleared by the [place] FDA and  has been authorized for detection and/or diagnosis of SARS-CoV-2 by  FDA under an Emergency Use Authorization (EUA). This EUA will remain  in effect (meaning this test can be used) for the duration of the  Covid-19 declaration under Section 564(b)(1) of the Act, 21  U.S.C. section 360bbb-3(b)(1), unless the authorization is  terminated or revoked. Performed at [facility], [address] [place], [place] [address]       Radiology Studies: No results found.   Scheduled Meds: . sodium chloride   Intravenous Once  . chlorhexidine  15 mL Mouth Rinse BID  . ciprofloxacin  500 mg Oral Daily  . feeding supplement  237 mL Oral TID BM  . furosemide  40 mg Oral Daily  . guaiFENesin  600 mg Oral BID  . lactulose  10 g Oral TID  . mouth rinse  15 mL Mouth Rinse q12n4p  . multivitamin with minerals  1 tablet Oral Daily  . pantoprazole  40 mg Intravenous Q12H  . sodium chloride flush  3 mL Intravenous Q12H  . sodium zirconium cyclosilicate  10 g Oral BID  . spironolactone  50 mg Oral Daily   Continuous Infusions: . sodium chloride       LOS: 3 days     Darlin Priestly, MD [facility] If 7PM-7AM, please contact night-coverage 02/25/2020, 5:12 PM

## 2020-02-26 ENCOUNTER — Inpatient Hospital Stay: Payer: Commercial Managed Care - HMO

## 2020-02-26 DIAGNOSIS — K7460 Unspecified cirrhosis of liver: Secondary | ICD-10-CM

## 2020-02-26 DIAGNOSIS — J90 Pleural effusion, not elsewhere classified: Secondary | ICD-10-CM

## 2020-02-26 DIAGNOSIS — K920 Hematemesis: Secondary | ICD-10-CM

## 2020-02-26 DIAGNOSIS — K7290 Hepatic failure, unspecified without coma: Secondary | ICD-10-CM

## 2020-02-26 LAB — BODY FLUID CELL COUNT WITH DIFFERENTIAL
Lymphs, Fluid: 35 %
Monocyte-Macrophage-Serous Fluid: 40 %
Neutrophil Count, Fluid: 25 %
Total Nucleated Cell Count, Fluid: 2080 cu mm

## 2020-02-26 LAB — CBC
HCT: 22.3 % — ABNORMAL LOW (ref 39.0–52.0)
Hemoglobin: 7.6 g/dL — ABNORMAL LOW (ref 13.0–17.0)
MCH: 36.2 pg — ABNORMAL HIGH (ref 26.0–34.0)
MCHC: 34.1 g/dL (ref 30.0–36.0)
MCV: 106.2 fL — ABNORMAL HIGH (ref 80.0–100.0)
Platelets: 99 10*3/uL — ABNORMAL LOW (ref 150–400)
RBC: 2.1 MIL/uL — ABNORMAL LOW (ref 4.22–5.81)
RDW: 14.9 % (ref 11.5–15.5)
WBC: 13.5 10*3/uL — ABNORMAL HIGH (ref 4.0–10.5)
nRBC: 0.1 % (ref 0.0–0.2)

## 2020-02-26 LAB — BASIC METABOLIC PANEL
Anion gap: 8 (ref 5–15)
BUN: 63 mg/dL — ABNORMAL HIGH (ref 6–20)
CO2: 20 mmol/L — ABNORMAL LOW (ref 22–32)
Calcium: 7.7 mg/dL — ABNORMAL LOW (ref 8.9–10.3)
Chloride: 92 mmol/L — ABNORMAL LOW (ref 98–111)
Creatinine, Ser: 1.58 mg/dL — ABNORMAL HIGH (ref 0.61–1.24)
GFR, Estimated: 57 mL/min — ABNORMAL LOW (ref >60)
Glucose, Bld: 99 mg/dL (ref 70–99)
Potassium: 4.6 mmol/L (ref 3.5–5.1)
Sodium: 120 mmol/L — ABNORMAL LOW (ref 135–145)

## 2020-02-26 LAB — PATHOLOGIST SMEAR REVIEW

## 2020-02-26 LAB — MAGNESIUM: Magnesium: 2.2 mg/dL (ref 1.7–2.4)

## 2020-02-26 LAB — PROTEIN, PLEURAL OR PERITONEAL FLUID: Total protein, fluid: <3 g/dL

## 2020-02-26 LAB — LACTATE DEHYDROGENASE, PLEURAL OR PERITONEAL FLUID: LD, Fluid: 50 U/L — ABNORMAL HIGH (ref 3–23)

## 2020-02-26 LAB — GLUCOSE, PLEURAL OR PERITONEAL FLUID: Glucose, Fluid: 109 mg/dL

## 2020-02-26 IMAGING — DX DG CHEST 1V PORT
1 series · 1 of 1 positions shown · non-contrast
Comparison: [DATE]

CLINICAL DATA: Right thoracentesis

EXAM:
PORTABLE CHEST 1 VIEW

[chest ap]
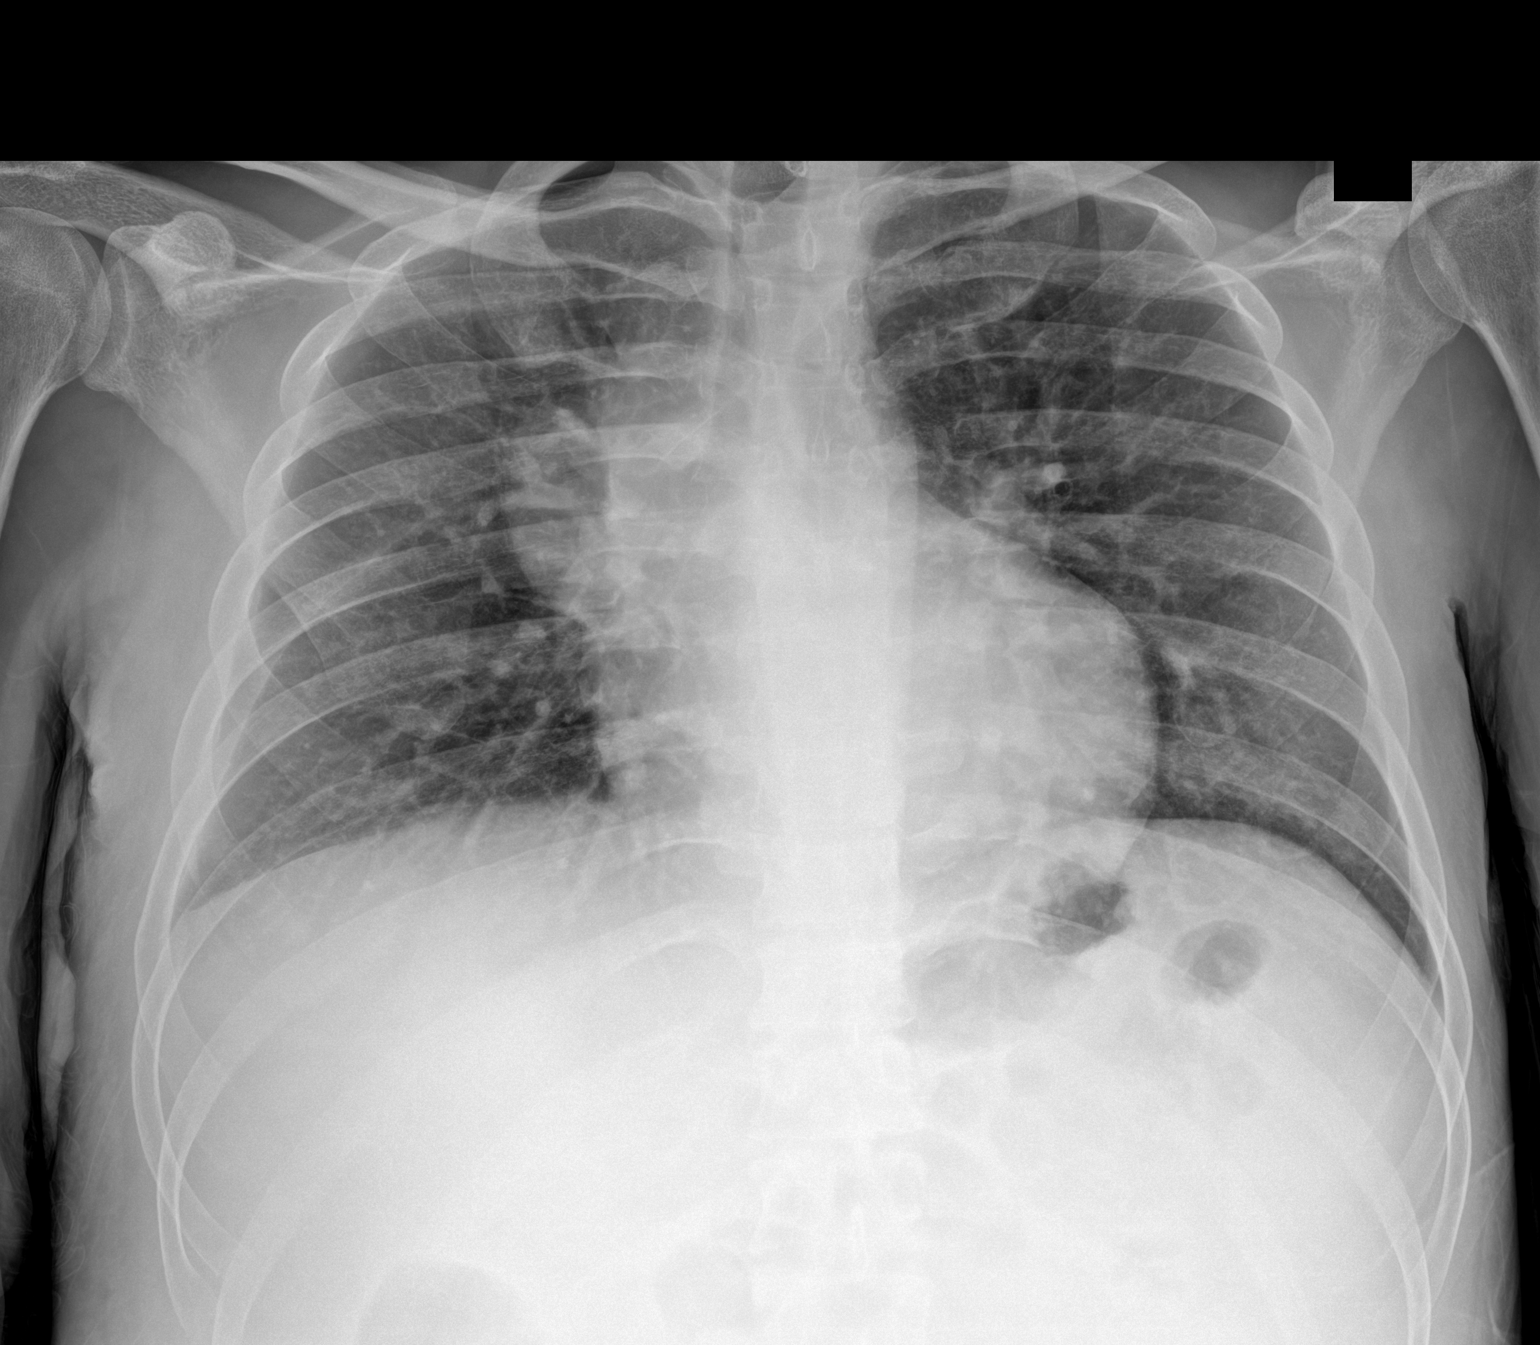

[1 of 1 positions shown; findings below may reference images not displayed]

FINDINGS: Effectively resolved right-sided pleural effusion. There is a focal
right perihilar opacity at a level previously obscured by pleural
fluid, up to 4.7 cm. Normal heart size. No pneumothorax.
IMPRESSION: 1. Marked decrease in right pleural effusion. No complicating
features.
2. Right perihilar opacity, at a level that was obscured on
comparison radiographs. This could reflect mass, adenopathy, or
fissural fluid. Recommend follow-up versus chest CT.

## 2020-02-26 MED ORDER — ALBUMIN HUMAN 25 % IV SOLN
50.0000 g | Freq: Four times a day (QID) | INTRAVENOUS | Status: DC
  Administered 2020-02-26 – 2020-02-29 (×12): 50 g via INTRAVENOUS
  Filled 2020-02-26 (×15): qty 200

## 2020-02-26 MED ORDER — PANTOPRAZOLE SODIUM 40 MG PO TBEC
40.0000 mg | DELAYED_RELEASE_TABLET | Freq: Two times a day (BID) | ORAL | Status: DC
  Administered 2020-02-26 – 2020-03-01 (×8): 40 mg via ORAL
  Filled 2020-02-26 (×9): qty 1

## 2020-02-26 MED ORDER — ALBUMIN HUMAN 25 % IV SOLN: 50.0000 g | Freq: Four times a day (QID) | INTRAVENOUS | Status: DC

## 2020-02-26 NOTE — Procedures (Signed)
Interventional Radiology Procedure Note  Procedure: [place] right thoracentesis    Complications: None  Estimated Blood Loss:  0  Findings: 4.8 L removed from the right chest Labs sent cxr pending    M. Ruel Favors, MD

## 2020-02-26 NOTE — Progress Notes (Signed)
PROGRESS NOTE    Bruce Bell  YIR:[id number] DOB: 1985/06/08 DOA: 02/22/2020 PCP: [facility]    Assessment & Plan:   Principal Problem:   GI bleed Active Problems:   Hyponatremia   Decompensated liver disease (HCC)   SBP (spontaneous bacterial peritonitis) (HCC)   Hematemesis with nausea   Secondary esophageal varices with bleeding (HCC)   Portal hypertensive gastropathy (HCC)   Protein-calorie malnutrition, severe   Bruce Bell is a 34 y.o. male with medical history significant for alcoholic liver cirrhosis as well as history of SBP on chronic antibiotic therapy with ciprofloxacin who recently moved from [place] last month to the area and was discharged from [facility] about 24 hours prior to presenting to the [facility].  He was seen at [facility] for evaluation of decompensated liver cirrhosis with increasing abdominal distention and shortness of breath.  Patient underwent ultrasound-guided paracentesis while at [facility] with drainage of about 3.6 L of fluid.  Further drainage was not done due to hypotension.  Patient had prior work-up done at [facility] and was negative for hepatitis B and C with positive hepatitis A IgG, negative ASMA/AMA.  Patient also noted to have a normal AFP level.  Patient was discharged in stable condition and advised to follow-up with GI as an outpatient for EGD to screen for esophageal varices.  He also had alpha-1 antitrypsin level as well ceruloplasmin levels ordered which are pending at the time of his discharge. Patient presented to the emergency room via EMS for evaluation of abdominal pain which she described as a diffuse, sharp pain mostly in the periumbilical area and rated 10 x 10 in intensity at its worst.  Onset of pain was about 10:30 PM and was associated with 2 episodes of emesis of bright red blood.     Hematemesis 2/2 esophageal varices with bleeding S/p EGD and EVL  x5 on 02/22/20 Patient with a history of liver cirrhosis who presents to the ER after 2 episodes of hematemesis containing bright red blood Patient has not been evaluated for varices previously --started on IV octreotide (72 hours done) and protonix --EGD 10/14 PLAN: --switch to oral PPI BID  Spontaneous bacterial peritonitis, ruled out --due to abdominal pain and ascites, pt was started on Rocephin 2 g IV daily on presentation. --ascitic fluid cell count neg for SBP, ceftriaxone d/c'ed. 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   coagulopathy and thrombocytopenia Recurrent ascites Portal HTN Patient has a meld score of 30 points with an estimated 52.6% 54-month mortality PLAN: --Hold Lasix due to increasing Cr --continue aldactone as 50 mg daily --Continue lactulose --will plan on paracentesis on Tuesday (avoid doing it on the same day as thoracentesis) --IV albumin 50g q6h  Dyspnea 2/2  Large right pleural effusion --CXR showed "Large right pleural effusion with worsened right upper lobe collapse."  PLAN: --US thoracentesis today with 4.8L removed --f/u fluid studies  Hyponatremia --2/2 cirhosis  Previous history of alcoholism Patient states that he has not had any alcohol in the last 1 year and hopes to be on transplant list   DVT prophylaxis: SCD/Compression stockings Code Status: Full code  Family Communication:  Status is: inpatient Dispo:   The patient is from: home Anticipated d/c is to: home Anticipated d/c date is: 1-2 days Patient currently is not medically stable to d/c due to: paracentesis tomorrow, may discharge if GI clears.   Subjective and Interval History:  Thoracentesis today with 4.8L out.  Pt felt much better afterwards.  Still felt uncomfortable with the size of his abdomen, and mild pain.     Objective: Vitals:   02/26/20 0926 02/26/20 0938 02/26/20 1214 02/26/20 1609  BP:  (!) 93/49 (!)  100/50 123/66  Pulse: 91  87 92  Resp:   18 18  Temp:   (!) 97.5 F (36.4 C) 97.7 F (36.5 C)  TempSrc:   Oral   SpO2:  96% 100% 100%  Weight:      Height:        Intake/Output Summary (Last 24 hours) at 02/26/2020 1709 Last data filed at 02/26/2020 1523 Gross per 24 hour  Intake 500 ml  Output --  Net 500 ml   Filed Weights   02/22/20 1452 02/24/20 0443 02/24/20 1929  Weight: 72.1 kg 75.9 kg 79 kg    Examination:   Constitutional: NAD, AAOx3 HEENT: conjunctivae and lids normal, EOMI CV: No cyanosis.   RESP: normal respiratory effort, on RA GI: fluid wave, abdomen large but soft Extremities: No effusions, edema in BLE SKIN: warm, dry and intact Neuro: II - XII grossly intact.   Psych: Normal mood and affect.  Appropriate judgement and reason    Data Reviewed: I have personally reviewed following labs and imaging studies  CBC: Recent Labs  Lab 02/22/20 0604 02/22/20 1651 02/23/20 0512 02/23/20 1704 02/24/20 0553 02/25/20 0404 02/26/20 0438  WBC 9.6  --  4.8  --  8.8 15.3* 13.5*  NEUTROABS 6.8  --   --   --   --   --   --   HGB 9.2*   < > 7.2* 7.9* 7.2* 7.3* 7.6*  HCT 27.0*   < > 21.4* 22.7* 20.8* 21.6* 22.3*  MCV 106.3*  --  105.4*  --  104.5* 106.9* 106.2*  PLT 133*  --  88*  --  95* 110* 99*   < > = values in this interval not displayed.   Basic Metabolic Panel: Recent Labs  Lab 02/22/20 0604 02/23/20 0512 02/24/20 0553 02/25/20 0404 02/26/20 0438  NA 123* 121* 121* 122* 120*  K 5.0 6.0* 5.1 4.9 4.6  CL 94* 96* 94* 94* 92*  CO2 24 19* 21* 23 20*  GLUCOSE 79 142* 118* 93 99  BUN 38* 37* 35* 48* 63*  CREATININE 1.07 1.11 1.07 1.36* 1.58*  CALCIUM 8.6* 8.0* 7.9* 7.6* 7.7*  MG  --   --  2.1 2.1 2.2   GFR: Estimated Creatinine Clearance: 64.3 mL/min (A) (by C-G formula based on SCr of 1.58 mg/dL (H)). Liver Function Tests: Recent Labs  Lab 02/20/20 0451 02/21/20 0513 02/22/20 0604  AST 80* 68* 82*  ALT 50* 50* 54*  ALKPHOS 152* 130*  174*  BILITOT 10.6* 10.7* 12.3*  PROT 6.6 6.4* 7.7  ALBUMIN 2.2* 2.3* 2.8*   No results for input(s): LIPASE, AMYLASE in the last 168 hours. Recent Labs  Lab 02/22/20 0604  AMMONIA 25   Coagulation Profile: Recent Labs  Lab 02/19/20 1830 02/20/20 0451 02/22/20 0604 02/22/20 1405  INR 2.2* 2.4* 2.0* 1.7*   Cardiac Enzymes: No results for input(s): CKTOTAL, CKMB, CKMBINDEX, TROPONINI in the last 168 hours. BNP (last 3 results) No results for input(s): PROBNP in the last 8760 hours. HbA1C: No results for input(s): HGBA1C in the last 72 hours. CBG: No results for input(s): GLUCAP in the last 168 hours. Lipid Profile: No results for input(s): CHOL, HDL, LDLCALC, TRIG, CHOLHDL, LDLDIRECT in  the last 72 hours. Thyroid Function Tests: No results for input(s): TSH, T4TOTAL, FREET4, T3FREE, THYROIDAB in the last 72 hours. Anemia Panel: No results for input(s): VITAMINB12, FOLATE, FERRITIN, TIBC, IRON, RETICCTPCT in the last 72 hours. Sepsis Labs: Recent Labs  Lab 02/25/20 0404  PROCALCITON 0.15    Recent Results (from the past 240 hour(s))  Respiratory Panel by RT PCR (Flu A&B, Covid) - Nasopharyngeal Swab     Status: None   Collection Time: 02/19/20  6:04 PM   Specimen: Nasopharyngeal Swab  Result Value Ref Range Status   SARS Coronavirus 2 by RT PCR NEGATIVE NEGATIVE Final    Comment: (NOTE) SARS-CoV-2 target nucleic acids are NOT DETECTED.  The SARS-CoV-2 RNA is generally detectable in upper respiratoy specimens during the acute phase of infection. The lowest concentration of SARS-CoV-2 viral copies this assay can detect is 131 copies/mL. A negative result does not preclude SARS-Cov-2 infection and should not be used as the sole basis for treatment or other patient management decisions. A negative result may occur with  improper specimen collection/handling, submission of specimen other than nasopharyngeal swab, presence of viral mutation(s) within the areas  targeted by this assay, and inadequate number of viral copies (<131 copies/mL). A negative result must be combined with clinical observations, patient history, and epidemiological information. The expected result is Negative.  Fact Sheet for Patients:  https://www.moore.com/  Fact Sheet for Healthcare Providers:  https://www.young.biz/  This test is no t yet approved or cleared by the [place] FDA and  has been authorized for detection and/or diagnosis of SARS-CoV-2 by FDA under an Emergency Use Authorization (EUA). This EUA will remain  in effect (meaning this test can be used) for the duration of the COVID-19 declaration under Section 564(b)(1) of the Act, 21 U.S.C. section 360bbb-3(b)(1), unless the authorization is terminated or revoked sooner.     Influenza A by PCR NEGATIVE NEGATIVE Final   Influenza B by PCR NEGATIVE NEGATIVE Final    Comment: (NOTE) The Xpert Xpress SARS-CoV-2/FLU/RSV assay is intended as an aid in  the diagnosis of influenza from Nasopharyngeal swab specimens and  should not be used as a sole basis for treatment. Nasal washings and  aspirates are unacceptable for Xpert Xpress SARS-CoV-2/FLU/RSV  testing.  Fact Sheet for Patients: https://www.moore.com/  Fact Sheet for Healthcare Providers: https://www.young.biz/  This test is not yet approved or cleared by the [place] FDA and  has been authorized for detection and/or diagnosis of SARS-CoV-2 by  FDA under an Emergency Use Authorization (EUA). This EUA will remain  in effect (meaning this test can be used) for the duration of the  Covid-19 declaration under Section 564(b)(1) of the Act, 21  U.S.C. section 360bbb-3(b)(1), unless the authorization is  terminated or revoked. Performed at [facility], [address] [place], [place] [address]   Body fluid culture     Status: None   Collection Time:  02/20/20 11:11 AM   Specimen: PATH Cytology Peritoneal fluid  Result Value Ref Range Status   Specimen Description   Final    PERITONEAL Performed at [facility], [address], [place], [place] [address]    Special Requests   Final    NONE Performed at [facility], [address] [place], [place] [address]    Gram Stain NO WBC SEEN NO ORGANISMS SEEN CYTOSPIN SMEAR   Final   Culture   Final    NO GROWTH 3 DAYS Performed at  [facility], [address], [place], [place] [address]    Report Status 02/23/2020 FINAL  Final  Culture, Urine     Status: Abnormal   Collection Time: 02/21/20  3:55 PM   Specimen: Urine, Clean Catch  Result Value Ref Range Status   Specimen Description   Final    URINE, CLEAN CATCH Performed at [facility], [address], [place], [place] [address]    Special Requests   Final    NONE Performed at [facility], [address], [place], [place] [address]    Culture MULTIPLE SPECIES PRESENT, SUGGEST RECOLLECTION (A)  Final   Report Status 02/22/2020 FINAL  Final  Body fluid culture     Status: None   Collection Time: 02/22/20  6:20 AM   Specimen: Peritoneal Washings; Body Fluid  Result Value Ref Range Status   Specimen Description   Final    PERITONEAL Performed at [facility], [address] [place], [place] [address]    Special Requests   Final    NONE Performed at [facility], [address] [place], [place] [address]    Gram Stain   Final    WBC PRESENT, PREDOMINANTLY MONONUCLEAR NO ORGANISMS SEEN CYTOSPIN SMEAR    Culture   Final    NO GROWTH 3 DAYS Performed at [facility], [address] [place], [place] [address]    Report Status 02/25/2020 FINAL  Final  Respiratory Panel by RT PCR (Flu A&B, Covid) - Nasopharyngeal Swab     Status: None   Collection Time: 02/22/20  6:37 AM   Specimen: Nasopharyngeal Swab  Result  Value Ref Range Status   SARS Coronavirus 2 by RT PCR NEGATIVE NEGATIVE Final    Comment: (NOTE) SARS-CoV-2 target nucleic acids are NOT DETECTED.  The SARS-CoV-2 RNA is generally detectable in upper respiratoy specimens during the acute phase of infection. The lowest concentration of SARS-CoV-2 viral copies this assay can detect is 131 copies/mL. A negative result does not preclude SARS-Cov-2 infection and should not be used as the sole basis for treatment or other patient management decisions. A negative result may occur with  improper specimen collection/handling, submission of specimen other than nasopharyngeal swab, presence of viral mutation(s) within the areas targeted by this assay, and inadequate number of viral copies (<131 copies/mL). A negative result must be combined with clinical observations, patient history, and epidemiological information. The expected result is Negative.  Fact Sheet for Patients:  https://www.moore.com/  Fact Sheet for Healthcare Providers:  https://www.young.biz/  This test is no t yet approved or cleared by the [place] FDA and  has been authorized for detection and/or diagnosis of SARS-CoV-2 by FDA under an Emergency Use Authorization (EUA). This EUA will remain  in effect (meaning this test can be used) for the duration of the COVID-19 declaration under Section 564(b)(1) of the Act, 21 U.S.C. section 360bbb-3(b)(1), unless the authorization is terminated or revoked sooner.     Influenza A by PCR NEGATIVE NEGATIVE Final   Influenza B by PCR NEGATIVE NEGATIVE Final    Comment: (NOTE) The Xpert Xpress SARS-CoV-2/FLU/RSV assay is intended as an aid in  the diagnosis of influenza from Nasopharyngeal swab specimens and  should not be used as a sole basis for treatment. Nasal washings and  aspirates are unacceptable for Xpert Xpress SARS-CoV-2/FLU/RSV  testing.  Fact Sheet for  Patients: https://www.moore.com/  Fact Sheet for Healthcare Providers: https://www.young.biz/  This test is  not yet approved or cleared by the [place] and  has been authorized for detection and/or diagnosis of SARS-CoV-2 by  FDA under an Emergency Use Authorization (EUA). This EUA will remain  in effect (meaning this test can be used) for the duration of the  Covid-19 declaration under Section 564(b)(1) of the Act, 21  U.S.C. section 360bbb-3(b)(1), unless the authorization is  terminated or revoked. Performed at [facility], [address] [place], [place] [address]   Body fluid culture     Status: None (Preliminary result)   Collection Time: 02/26/20  9:20 AM   Specimen: PATH Cytology Pleural fluid  Result Value Ref Range Status   Specimen Description   Final    PLEURAL Performed at [facility], [address] [place], [place] [address]    Special Requests   Final    PLEURAL Performed at [facility], [address] [place], [place] [address]    Gram Stain   Final    NO WBC SEEN NO ORGANISMS SEEN CYTOSPIN SMEAR Performed at [facility], [address] [place], [place] [address]    Culture PENDING  Incomplete   Report Status PENDING  Incomplete      Radiology Studies: DG Chest Port 1 View  Result Date: 02/26/2020 CLINICAL DATA:  Right thoracentesis EXAM: PORTABLE CHEST 1 VIEW COMPARISON:  02/20/2020 FINDINGS: Effectively resolved right-sided pleural effusion. There is a focal right perihilar opacity at a level previously obscured by pleural fluid, up to 4.7 cm. Normal heart size. No pneumothorax. IMPRESSION: 1. Marked decrease in right pleural effusion. No complicating features. 2. Right perihilar opacity, at a level that was obscured on comparison radiographs. This could reflect mass, adenopathy, or fissural fluid. Recommend follow-up versus chest CT. Electronically Signed   By:  Marnee Spring M.D.   On: 02/26/2020 09:46   US THORACENTESIS ASP PLEURAL SPACE W/IMG GUIDE  Result Date: 02/26/2020 INDICATION: CIRRHOSIS, LARGE RIGHT EFFUSION COMPATIBLE WITH HEPATIC HYDROTHORAX EXAM: ULTRASOUND GUIDED RIGHT THORACENTESIS MEDICATIONS: 1% lidocaine local COMPLICATIONS: None immediate. PROCEDURE: An ultrasound guided thoracentesis was thoroughly discussed with the patient and questions answered. The benefits, risks, alternatives and complications were also discussed. The patient understands and wishes to proceed with the procedure. Written consent was obtained. Ultrasound was performed to localize and mark an adequate pocket of fluid in the right chest. The area was then prepped and draped in the normal sterile fashion. 1% Lidocaine was used for local anesthesia. Under ultrasound guidance a 6 Fr Safe-T-Centesis catheter was introduced. Thoracentesis was performed. The catheter was removed and a dressing applied. FINDINGS: A total of approximately 4.8 L of amber colored pleural fluid was removed. Samples were sent to the laboratory as requested by the clinical team. IMPRESSION: Successful ultrasound guided right thoracentesis yielding 4.8 L of pleural fluid. Electronically Signed   By: Judie Petit.  Shick M.D.   On: 02/26/2020 09:46     Scheduled Meds:  sodium chloride   Intravenous Once   chlorhexidine  15 mL Mouth Rinse BID   ciprofloxacin  500 mg Oral Daily   feeding supplement  237 mL Oral TID BM   guaiFENesin  600 mg Oral BID   lactulose  10 g Oral TID   mouth rinse  15 mL Mouth Rinse q12n4p   multivitamin with minerals  1 tablet Oral Daily   pantoprazole  40 mg Intravenous Q12H   sodium chloride flush  3 mL Intravenous Q12H   spironolactone  50 mg Oral  Daily   Continuous Infusions:  sodium chloride     albumin human 50 g (02/26/20 1318)     LOS: 4 days     Darlin Priestly, MD [facility] If 7PM-7AM, please contact night-coverage 02/26/2020, 5:09 PM

## 2020-02-26 NOTE — Progress Notes (Signed)
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  Subjective:   Feels much better after thoracocentesis, no further issues    Objective: Vital signs in last 24 hours: Vitals:   02/26/20 0840 02/26/20 0859 02/26/20 0926 02/26/20 0938  BP: (!) 118/58 (!) 95/56  (!) 93/49  Pulse: 97 96 91   Resp: 20 18    Temp: 97.9 F (36.6 C)     TempSrc: Oral     SpO2: 96% 96%  96%  Weight:      Height:       Weight change:   Intake/Output Summary (Last 24 hours) at 02/26/2020 1017 Last data filed at 02/26/2020 7619 Gross per 24 hour  Intake 0 ml  Output --  Net 0 ml     Exam: Heart:: Regular rate and rhythm, S1S2 present or without murmur or extra heart sounds Lungs: normal, clear to auscultation and clear to auscultation and percussion Abdomen: soft, nontender, normal bowel sounds   Lab Results: @LABTEST2 @ Micro Results: Recent Results (from the past 240 hour(s))  Respiratory Panel by RT PCR (Flu A&B, Covid) - Nasopharyngeal Swab     Status: None   Collection Time: 02/19/20  6:04 PM   Specimen: Nasopharyngeal Swab  Result Value Ref Range Status   SARS Coronavirus 2 by RT PCR NEGATIVE NEGATIVE Final    Comment: (NOTE) SARS-CoV-2 target nucleic acids are NOT DETECTED.  The SARS-CoV-2 RNA is generally detectable in upper respiratoy specimens during the acute phase of infection. The lowest concentration of SARS-CoV-2 viral copies this assay can detect is 131 copies/mL. A negative result does not preclude SARS-Cov-2 infection and should not be used as the sole basis for treatment or other patient management decisions. A negative result may occur with  improper specimen collection/handling, submission of specimen other than nasopharyngeal swab, presence of viral mutation(s)  within the areas targeted by this assay, and inadequate number of viral copies (<131 copies/mL). A negative result must be combined with clinical observations, patient history, and epidemiological information. The expected result is Negative.  Fact Sheet for Patients:  04/20/20  Fact Sheet for Healthcare Providers:  https://www.moore.com/  This test is no t yet approved or cleared by the https://www.young.biz/ FDA and  has been authorized for detection and/or diagnosis of SARS-CoV-2 by FDA under an Emergency Use Authorization (EUA). This EUA will remain  in effect (meaning this test can be used) for the duration of the COVID-19 declaration under Section 564(b)(1) of the Act, 21 U.S.C. section 360bbb-3(b)(1), unless the authorization is terminated or revoked sooner.     Influenza A by PCR NEGATIVE NEGATIVE Final   Influenza B by PCR NEGATIVE NEGATIVE Final    Comment: (NOTE) The Xpert Xpress SARS-CoV-2/FLU/RSV assay is intended as an aid in  the diagnosis of influenza from Nasopharyngeal swab specimens and  should not be used as a sole basis for treatment. Nasal washings and  aspirates are unacceptable for Xpert Xpress SARS-CoV-2/FLU/RSV  testing.  Fact Sheet for Patients: [place]  Fact Sheet for Healthcare Providers: https://www.moore.com/  This test is not yet approved or cleared by the https://www.young.biz/ FDA and  has been authorized for detection and/or diagnosis of SARS-CoV-2 by  FDA under an Emergency Use Authorization (EUA). This EUA will remain  in effect (meaning this test can be used)  for the duration of the  Covid-19 declaration under Section 564(b)(1) of the Act, 21  U.S.C. section 360bbb-3(b)(1), unless the authorization is  terminated or revoked. Performed at [facility], [address] [place], [place] [address]   Body fluid culture     Status: None    Collection Time: 02/20/20 11:11 AM   Specimen: PATH Cytology Peritoneal fluid  Result Value Ref Range Status   Specimen Description   Final    PERITONEAL Performed at [facility], [address] [place], [place] [address]    Special Requests   Final    NONE Performed at [facility], [address] [place], [place] [address]    Gram Stain NO WBC SEEN NO ORGANISMS SEEN CYTOSPIN SMEAR   Final   Culture   Final    NO GROWTH 3 DAYS Performed at [facility], [address] [place], [place] [address]    Report Status 02/23/2020 FINAL  Final  Culture, Urine     Status: Abnormal   Collection Time: 02/21/20  3:55 PM   Specimen: Urine, Clean Catch  Result Value Ref Range Status   Specimen Description   Final    URINE, CLEAN CATCH Performed at [facility], [address] [place], [place] [address]    Special Requests   Final    NONE Performed at [facility], [address] [place], [place] [address]    Culture MULTIPLE SPECIES PRESENT, SUGGEST RECOLLECTION (A)  Final   Report Status 02/22/2020 FINAL  Final  Body fluid culture     Status: None   Collection Time: 02/22/20  6:20 AM   Specimen: Peritoneal Washings; Body Fluid  Result Value Ref Range Status   Specimen Description   Final    PERITONEAL Performed at [facility], [address] [place], [place] [address]    Special Requests   Final    NONE Performed at [facility], [address] [place], [place] [address]    Gram Stain   Final    WBC PRESENT, PREDOMINANTLY MONONUCLEAR NO ORGANISMS SEEN CYTOSPIN SMEAR    Culture   Final    NO GROWTH 3 DAYS Performed at [facility], [address] [place], [place] [address]    Report Status 02/25/2020 FINAL  Final  Respiratory Panel by RT PCR (Flu A&B, Covid) - Nasopharyngeal Swab     Status: None   Collection Time: 02/22/20  6:37 AM   Specimen:  Nasopharyngeal Swab  Result Value Ref Range Status   SARS Coronavirus 2 by RT PCR NEGATIVE NEGATIVE Final    Comment: (NOTE) SARS-CoV-2 target nucleic acids are NOT DETECTED.  The SARS-CoV-2 RNA is generally detectable in upper respiratoy specimens during the acute phase of infection. The lowest concentration of SARS-CoV-2 viral copies this assay can detect is 131 copies/mL. A negative result does not preclude SARS-Cov-2 infection and should not be used as the sole basis for treatment or other patient management decisions. A negative result may occur with  improper specimen collection/handling, submission of specimen other than nasopharyngeal swab, presence of viral mutation(s) within the areas targeted by this assay, and inadequate number of viral copies (<131 copies/mL). A negative result must be combined with clinical observations, patient history, and epidemiological information. The expected result is Negative.  Fact Sheet for Patients:  https://www.moore.com/https://www.fda.gov/media/142436/download  Fact Sheet for Healthcare Providers:  https://www.young.biz/https://www.fda.gov/media/142435/download  This test is no t yet  approved or cleared by the [place] and  has been authorized for detection and/or diagnosis of SARS-CoV-2 by FDA under an Emergency Use Authorization (EUA). This EUA will remain  in effect (meaning this test can be used) for the duration of the COVID-19 declaration under Section 564(b)(1) of the Act, 21 U.S.C. section 360bbb-3(b)(1), unless the authorization is terminated or revoked sooner.     Influenza A by PCR NEGATIVE NEGATIVE Final   Influenza B by PCR NEGATIVE NEGATIVE Final    Comment: (NOTE) The Xpert Xpress SARS-CoV-2/FLU/RSV assay is intended as an aid in  the diagnosis of influenza from Nasopharyngeal swab specimens and  should not be used as a sole basis for treatment. Nasal washings and  aspirates are unacceptable for Xpert Xpress SARS-CoV-2/FLU/RSV  testing.  Fact Sheet  for Patients: https://www.moore.com/  Fact Sheet for Healthcare Providers: https://www.young.biz/  This test is not yet approved or cleared by the [place] FDA and  has been authorized for detection and/or diagnosis of SARS-CoV-2 by  FDA under an Emergency Use Authorization (EUA). This EUA will remain  in effect (meaning this test can be used) for the duration of the  Covid-19 declaration under Section 564(b)(1) of the Act, 21  U.S.C. section 360bbb-3(b)(1), unless the authorization is  terminated or revoked. Performed at [facility], [address] [place], [place] [address]    Studies/Results: [facility] Chest Port 1 View  Result Date: 02/26/2020 CLINICAL DATA:  Right thoracentesis EXAM: PORTABLE CHEST 1 VIEW COMPARISON:  02/20/2020 FINDINGS: Effectively resolved right-sided pleural effusion. There is a focal right perihilar opacity at a level previously obscured by pleural fluid, up to 4.7 cm. Normal heart size. No pneumothorax. IMPRESSION: 1. Marked decrease in right pleural effusion. No complicating features. 2. Right perihilar opacity, at a level that was obscured on comparison radiographs. This could reflect mass, adenopathy, or fissural fluid. Recommend follow-up versus chest CT. Electronically Signed   By: Marnee Spring M.D.   On: 02/26/2020 09:46   US THORACENTESIS ASP PLEURAL SPACE W/IMG GUIDE  Result Date: 02/26/2020 INDICATION: CIRRHOSIS, LARGE RIGHT EFFUSION COMPATIBLE WITH HEPATIC HYDROTHORAX EXAM: ULTRASOUND GUIDED RIGHT THORACENTESIS MEDICATIONS: 1% lidocaine local COMPLICATIONS: None immediate. PROCEDURE: An ultrasound guided thoracentesis was thoroughly discussed with the patient and questions answered. The benefits, risks, alternatives and complications were also discussed. The patient understands and wishes to proceed with the procedure. Written consent was obtained. Ultrasound was performed to localize and mark an  adequate pocket of fluid in the right chest. The area was then prepped and draped in the normal sterile fashion. 1% Lidocaine was used for local anesthesia. Under ultrasound guidance a 6 Fr Safe-T-Centesis catheter was introduced. Thoracentesis was performed. The catheter was removed and a dressing applied. FINDINGS: A total of approximately 4.8 L of amber colored pleural fluid was removed. Samples were sent to the laboratory as requested by the clinical team. IMPRESSION: Successful ultrasound guided right thoracentesis yielding 4.8 L of pleural fluid. Electronically Signed   By: Judie Petit.  Shick M.D.   On: 02/26/2020 09:46   Medications: I have reviewed the patient's current medications. Scheduled Meds: . sodium chloride   Intravenous Once  . chlorhexidine  15 mL Mouth Rinse BID  . ciprofloxacin  500 mg Oral Daily  . feeding supplement  237 mL Oral TID BM  . guaiFENesin  600 mg Oral BID  . lactulose  10 g Oral TID  . mouth rinse  15 mL Mouth Rinse q12n4p  . multivitamin with minerals  1 tablet Oral Daily  . pantoprazole  40 mg Intravenous Q12H  . sodium chloride flush  3 mL Intravenous Q12H  . spironolactone  50 mg Oral Daily   Continuous Infusions: . sodium chloride     PRN Meds:.sodium chloride, chlorpheniramine-HYDROcodone, hydrocortisone cream, hydrOXYzine, ondansetron **OR** ondansetron (ZOFRAN) IV, sodium chloride flush, traMADol   Assessment: Principal Problem:   GI bleed Active Problems:   Hyponatremia   Decompensated liver disease (HCC)   SBP (spontaneous bacterial peritonitis) (HCC)   Hematemesis with nausea   Secondary esophageal varices with bleeding (HCC)   Portal hypertensive gastropathy (HCC)   Protein-calorie malnutrition, severe  Rupert Azzara 34 y.o. male the history with a history of alcoholic liver cirrhosis admitted on 02/22/2020.  Admitted with hematemesis, acute liver failure negative for spontaneous bacterial peritonitis on paracentesis.  Also has a pleural effusion  consideration for hepatic hydrothorax.  Had an EGD on 02/22/2020 that demonstrated 3 columns of grade 2 varices with stigmata of recent bleeding with red wale signs.  5 bands were placed.  Mild portal hypertensive gastropathy. Presently hemoglobin 7.6 g with an MCV of 106, creatinine of 1.58 which has risen since admission when it was 1.1.  Low chloride low sodium.  Patient has undergone diagnostic thoracocentesis.  Right upper quadrant ultrasound shows moderate to large volume ascites large pleural effusion cholelithiasis.  Plan: 1.  Limit dietary salt. 2.  Worsening renal function differentials include hepatorenal syndrome versus prerenal etiologies.  Suggest further nephrology work-up including renal ultrasound, trial of albumin infusion to check if creatinine drops.  Avoid any nephrotoxic drugs such as NSAIDs. 3.  Await results of the thoracocentesis.  If it is a transudate then may need abdominal shunt study nuclear imaging to determine if the abdominal ascites is tracking into the chest provided cant increase dose of aldactobe.  If does not respond to medical management may need a TIPS procedure. 4.  Repeat EGD in 2 to 3 weeks with Dr. Maximino Greenland in view of recent banding of varices.  Can discontinue octreotide after 72 hours.  Continue IV PPI 5.  Agree with ciprofloxacin for primary prophylaxis for spontaneous bacterial peritonitis is, continue lactulose,  Aldactone 50 mg a day.IF potassium permits increase to 100 mg a day tomorrow  6.  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  7.  Input from dietitian for good nutrition. 8. I spoke to patient and his mother and explained as an outpatient will need follow [facility] at [facility] liver transplant right upper quadrant USG q 67months to [facility] for [facility].     LOS: 4 days   [name], MD 02/26/2020, 10:17 AM

## 2020-02-27 ENCOUNTER — Inpatient Hospital Stay: Payer: Commercial Managed Care - HMO

## 2020-02-27 LAB — CBC
HCT: 19.4 % — ABNORMAL LOW (ref 39.0–52.0)
HCT: 21 % — ABNORMAL LOW (ref 39.0–52.0)
Hemoglobin: 6.6 g/dL — ABNORMAL LOW (ref 13.0–17.0)
Hemoglobin: 7.2 g/dL — ABNORMAL LOW (ref 13.0–17.0)
MCH: 36.3 pg — ABNORMAL HIGH (ref 26.0–34.0)
MCH: 35.5 pg — ABNORMAL HIGH (ref 26.0–34.0)
MCHC: 34 g/dL (ref 30.0–36.0)
MCHC: 34.3 g/dL (ref 30.0–36.0)
MCV: 106.6 fL — ABNORMAL HIGH (ref 80.0–100.0)
MCV: 103.4 fL — ABNORMAL HIGH (ref 80.0–100.0)
Platelets: 67 10*3/uL — ABNORMAL LOW (ref 150–400)
Platelets: 60 10*3/uL — ABNORMAL LOW (ref 150–400)
RBC: 1.82 MIL/uL — ABNORMAL LOW (ref 4.22–5.81)
RBC: 2.03 MIL/uL — ABNORMAL LOW (ref 4.22–5.81)
RDW: 15.2 % (ref 11.5–15.5)
RDW: 17 % — ABNORMAL HIGH (ref 11.5–15.5)
WBC: 7.2 10*3/uL (ref 4.0–10.5)
WBC: 6.1 10*3/uL (ref 4.0–10.5)
nRBC: 0 % (ref 0.0–0.2)
nRBC: 0 % (ref 0.0–0.2)

## 2020-02-27 LAB — BASIC METABOLIC PANEL
Anion gap: 8 (ref 5–15)
BUN: 51 mg/dL — ABNORMAL HIGH (ref 6–20)
CO2: 23 mmol/L (ref 22–32)
Calcium: 8.4 mg/dL — ABNORMAL LOW (ref 8.9–10.3)
Chloride: 96 mmol/L — ABNORMAL LOW (ref 98–111)
Creatinine, Ser: 1.11 mg/dL (ref 0.61–1.24)
GFR, Estimated: >60 mL/min (ref >60)
Glucose, Bld: 94 mg/dL (ref 70–99)
Potassium: 4.7 mmol/L (ref 3.5–5.1)
Sodium: 127 mmol/L — ABNORMAL LOW (ref 135–145)

## 2020-02-27 LAB — PROTEIN, BODY FLUID (OTHER): Total Protein, Body Fluid Other: 1.4 g/dL

## 2020-02-27 LAB — MAGNESIUM: Magnesium: 2.3 mg/dL (ref 1.7–2.4)

## 2020-02-27 LAB — PREPARE RBC (CROSSMATCH)

## 2020-02-27 MED ORDER — SODIUM CHLORIDE 0.9% IV SOLUTION: Freq: Once | INTRAVENOUS | Status: AC

## 2020-02-27 MED ORDER — SODIUM CHLORIDE 0.9 % IV SOLN
50.0000 ug/h | INTRAVENOUS | Status: DC
  Administered 2020-02-27 – 2020-03-01 (×9): 50 ug/h via INTRAVENOUS
  Filled 2020-02-27 (×14): qty 1

## 2020-02-27 NOTE — Care Management (Signed)
Per MD and bedside RN not indicated at this time.  TOC consult for home health cancelled per verbal from MD.  Please re-consult if indicated

## 2020-02-27 NOTE — Progress Notes (Signed)
[name] , MD [address], [place], [place], [address] [address], [address], [place], [place], [address] Phone: [phone]  Fax: [phone]   Graeme Menees is being followed for liver cirrhosis and upper GI bleed due to varices  Subjective:   He says he coughed up a dime size of blood clot earlier today - normal bowel movements , brown in color, no abdominal pain    Objective: Vital signs in last 24 hours: Vitals:   02/27/20 0437 02/27/20 0806 02/27/20 0940 02/27/20 0956  BP: (!) 106/55 (!) 115/53 120/64 111/63  Pulse: 93 90 87 80  Resp: 18 20 18 18   Temp: (!) 97.3 F (36.3 C) 98.3 F (36.8 C) 98.5 F (36.9 C) 98 F (36.7 C)  TempSrc: Oral Oral Oral Oral  SpO2: 100% 97% 99% 99%  Weight:      Height:       Weight change:   Intake/Output Summary (Last 24 hours) at 02/27/2020 1107 Last data filed at 02/27/2020 0400 Gross per 24 hour  Intake 815.24 ml  Output 350 ml  Net 465.24 ml     Exam:  Abdomen: soft, nontender, normal bowel sounds   Lab Results: @LABTEST2 @ Micro Results: Recent Results (from the past 240 hour(s))  Respiratory Panel by RT PCR (Flu A&B, Covid) - Nasopharyngeal Swab     Status: None   Collection Time: 02/19/20  6:04 PM   Specimen: Nasopharyngeal Swab  Result Value Ref Range Status   SARS Coronavirus 2 by RT PCR NEGATIVE NEGATIVE Final    Comment: (NOTE) SARS-CoV-2 target nucleic acids are NOT DETECTED.  The SARS-CoV-2 RNA is generally detectable in upper respiratoy specimens during the acute phase of infection. The lowest concentration of SARS-CoV-2 viral copies this assay can detect is 131 copies/mL. A negative result does not preclude SARS-Cov-2 infection and should not be used as the sole basis for treatment or other patient management decisions. A negative result may occur with  improper specimen collection/handling, submission of specimen other than nasopharyngeal swab, presence of viral mutation(s) within  the areas targeted by this assay, and inadequate number of viral copies (<131 copies/mL). A negative result must be combined with clinical observations, patient history, and epidemiological information. The expected result is Negative.  Fact Sheet for Patients:   Fact Sheet for Healthcare Providers:  04/20/20  This test is no t yet approved or cleared by the https://www.moore.com/ FDA and  has been authorized for detection and/or diagnosis of SARS-CoV-2 by FDA under an Emergency Use Authorization (EUA). This EUA will remain  in effect (meaning this test can be used) for the duration of the COVID-19 declaration under Section 564(b)(1) of the Act, 21 U.S.C. section 360bbb-3(b)(1), unless the authorization is terminated or revoked sooner.     Influenza A by PCR NEGATIVE NEGATIVE Final   Influenza B by PCR NEGATIVE NEGATIVE Final    Comment: (NOTE) The Xpert Xpress SARS-CoV-2/FLU/RSV assay is intended as an aid in  the diagnosis of influenza from Nasopharyngeal swab specimens and  should not be used as a sole basis for treatment. Nasal washings and  aspirates are unacceptable for Xpert Xpress SARS-CoV-2/FLU/RSV  testing.  Fact Sheet for Patients: https://www.young.biz/  Fact Sheet for Healthcare Providers: [place]  This test is not yet approved or cleared by the https://www.moore.com/ FDA and  has been authorized for detection and/or diagnosis of SARS-CoV-2 by  FDA under an Emergency Use Authorization (EUA). This EUA will remain  in effect (  meaning this test can be used) for the duration of the  Covid-19 declaration under Section 564(b)(1) of the Act, 21  U.S.C. section 360bbb-3(b)(1), unless the authorization is  terminated or revoked. Performed at [facility], [address] [place], [place] [address]   Body fluid culture     Status: None   Collection  Time: 02/20/20 11:11 AM   Specimen: PATH Cytology Peritoneal fluid  Result Value Ref Range Status   Specimen Description   Final    PERITONEAL Performed at [facility], [address], [place], [place] [address]    Special Requests   Final    NONE Performed at [facility], [address] [place], [place] [address]    Gram Stain NO WBC SEEN NO ORGANISMS SEEN CYTOSPIN SMEAR   Final   Culture   Final    NO GROWTH 3 DAYS Performed at [facility], [address] [place], [place] [address]    Report Status 02/23/2020 FINAL  Final  Culture, Urine     Status: Abnormal   Collection Time: 02/21/20  3:55 PM   Specimen: Urine, Clean Catch  Result Value Ref Range Status   Specimen Description   Final    URINE, CLEAN CATCH Performed at [facility], [address], [place], [place] [address]    Special Requests   Final    NONE Performed at [facility], [address] [place], [place] [address]    Culture MULTIPLE SPECIES PRESENT, SUGGEST RECOLLECTION (A)  Final   Report Status 02/22/2020 FINAL  Final  Body fluid culture     Status: None   Collection Time: 02/22/20  6:20 AM   Specimen: Peritoneal Washings; Body Fluid  Result Value Ref Range Status   Specimen Description   Final    PERITONEAL Performed at [facility], [address] [place], [place] [address]    Special Requests   Final    NONE Performed at [facility], [address] [place], [place] [address]    Gram Stain   Final    WBC PRESENT, PREDOMINANTLY MONONUCLEAR NO ORGANISMS SEEN CYTOSPIN SMEAR    Culture   Final    NO GROWTH 3 DAYS Performed at [facility], [address], [place], [place] [address]    Report Status 02/25/2020 FINAL  Final  Respiratory Panel by RT PCR (Flu A&B, Covid) - Nasopharyngeal Swab     Status: None   Collection Time: 02/22/20  6:37 AM   Specimen: Nasopharyngeal Swab    Result Value Ref Range Status   SARS Coronavirus 2 by RT PCR NEGATIVE NEGATIVE Final    Comment: (NOTE) SARS-CoV-2 target nucleic acids are NOT DETECTED.  The SARS-CoV-2 RNA is generally detectable in upper respiratoy specimens during the acute phase of infection. The lowest concentration of SARS-CoV-2 viral copies this assay can detect is 131 copies/mL. A negative result does not preclude SARS-Cov-2 infection and should not be used as the sole basis for treatment or other patient management decisions. A negative result may occur with  improper specimen collection/handling, submission of specimen other than nasopharyngeal swab, presence of viral mutation(s) within the areas targeted by this assay, and inadequate number of viral copies (<131 copies/mL). A negative result must be combined with clinical observations, patient history, and epidemiological information. The expected result is Negative.  Fact Sheet for Patients:  https://www.moore.com/  Fact Sheet for Healthcare Providers:  https://www.young.biz/  This test is no t yet approved or cleared by the [place] and  has been authorized for detection and/or diagnosis of SARS-CoV-2 by FDA under an Emergency Use Authorization (EUA). This EUA will remain  in effect (meaning this test can be used) for the duration of the COVID-19 declaration under Section 564(b)(1) of the Act, 21 U.S.C. section 360bbb-3(b)(1), unless the authorization is terminated or revoked sooner.     Influenza A by PCR NEGATIVE NEGATIVE Final   Influenza B by PCR NEGATIVE NEGATIVE Final    Comment: (NOTE) The Xpert Xpress SARS-CoV-2/FLU/RSV assay is intended as an aid in  the diagnosis of influenza from Nasopharyngeal swab specimens and  should not be used as a sole basis for treatment. Nasal washings and  aspirates are unacceptable for Xpert Xpress SARS-CoV-2/FLU/RSV  testing.  Fact Sheet for  Patients: https://www.moore.com/  Fact Sheet for Healthcare Providers: https://www.young.biz/  This test is not yet approved or cleared by the [place] FDA and  has been authorized for detection and/or diagnosis of SARS-CoV-2 by  FDA under an Emergency Use Authorization (EUA). This EUA will remain  in effect (meaning this test can be used) for the duration of the  Covid-19 declaration under Section 564(b)(1) of the Act, 21  U.S.C. section 360bbb-3(b)(1), unless the authorization is  terminated or revoked. Performed at [facility], [address] [place], [place] [address]   Body fluid culture     Status: None (Preliminary result)   Collection Time: 02/26/20  9:20 AM   Specimen: PATH Cytology Pleural fluid  Result Value Ref Range Status   Specimen Description   Final    PLEURAL Performed at [facility], [address], [place], [place] [address]    Special Requests   Final    PLEURAL Performed at [facility], [address] [place], [place] [address]    Gram Stain NO WBC SEEN NO ORGANISMS SEEN CYTOSPIN SMEAR   Final   Culture   Final    NO GROWTH < 24 HOURS Performed at [facility], [address] [place], [place] [address]    Report Status PENDING  Incomplete   Studies/Results: DG Chest 2 View  Result Date: 02/27/2020 CLINICAL DATA:  RIGHT chest wall pain since thoracentesis performed yesterday EXAM: CHEST - 2 VIEW COMPARISON:  02/26/2020 FINDINGS: Enlargement of cardiac silhouette. Mediastinal contours normal. Increased RIGHT pleural effusion and lower lung atelectasis since prior study. RIGHT perihilar opacity seen on previous exam obscured by atelectasis. No pneumothorax. LEFT lung clear. IMPRESSION: Recurrent RIGHT pleural effusion and basilar atelectasis. Electronically Signed   By: Ulyses Southward M.D.   On: 02/27/2020 09:32   DG Chest Port 1 View  Result Date: 02/26/2020 CLINICAL  DATA:  Right thoracentesis EXAM: PORTABLE CHEST 1 VIEW COMPARISON:  02/20/2020 FINDINGS: Effectively resolved right-sided pleural effusion. There is a focal right perihilar opacity at a level previously obscured by pleural fluid, up to 4.7 cm. Normal heart size. No pneumothorax. IMPRESSION: 1. Marked decrease in right pleural effusion. No complicating features. 2. Right perihilar opacity, at a level that was obscured on comparison radiographs. This could reflect mass, adenopathy, or fissural fluid. Recommend follow-up versus chest CT. Electronically Signed   By: Marnee Spring M.D.   On: 02/26/2020 09:46   US THORACENTESIS ASP PLEURAL SPACE W/IMG GUIDE  Result Date: 02/26/2020 INDICATION: CIRRHOSIS, LARGE RIGHT EFFUSION COMPATIBLE WITH HEPATIC HYDROTHORAX EXAM: ULTRASOUND GUIDED RIGHT THORACENTESIS MEDICATIONS: 1% lidocaine local COMPLICATIONS: None immediate. PROCEDURE:  An ultrasound guided thoracentesis was thoroughly discussed with the patient and questions answered. The benefits, risks, alternatives and complications were also discussed. The patient understands and wishes to proceed with the procedure. Written consent was obtained. Ultrasound was performed to localize and mark an adequate pocket of fluid in the right chest. The area was then prepped and draped in the normal sterile fashion. 1% Lidocaine was used for local anesthesia. Under ultrasound guidance a 6 Fr Safe-T-Centesis catheter was introduced. Thoracentesis was performed. The catheter was removed and a dressing applied. FINDINGS: A total of approximately 4.8 L of amber colored pleural fluid was removed. Samples were sent to the laboratory as requested by the clinical team. IMPRESSION: Successful ultrasound guided right thoracentesis yielding 4.8 L of pleural fluid. Electronically Signed   By: Judie Petit.  Shick M.D.   On: 02/26/2020 09:46   Medications: I have reviewed the patient's current medications. Scheduled Meds: . sodium chloride    Intravenous Once  . chlorhexidine  15 mL Mouth Rinse BID  . ciprofloxacin  500 mg Oral Daily  . feeding supplement  237 mL Oral TID BM  . guaiFENesin  600 mg Oral BID  . lactulose  10 g Oral TID  . mouth rinse  15 mL Mouth Rinse q12n4p  . multivitamin with minerals  1 tablet Oral Daily  . pantoprazole  40 mg Oral BID  . sodium chloride flush  3 mL Intravenous Q12H  . spironolactone  50 mg Oral Daily   Continuous Infusions: . sodium chloride    . albumin human 50 g (02/27/20 0543)  . octreotide  (SANDOSTATIN)    IV infusion 50 mcg/hr (02/27/20 1007)   PRN Meds:.sodium chloride, chlorpheniramine-HYDROcodone, hydrocortisone cream, hydrOXYzine, ondansetron **OR** ondansetron (ZOFRAN) IV, sodium chloride flush, traMADol   Assessment: Principal Problem:   GI bleed Active Problems:   Hyponatremia   Decompensated liver disease (HCC)   SBP (spontaneous bacterial peritonitis) (HCC)   Hematemesis with nausea   Secondary esophageal varices with bleeding (HCC)   Portal hypertensive gastropathy (HCC)   Protein-calorie malnutrition, severe  Stiles Maxcy 34 y.o. male the history with a history of alcoholic liver cirrhosis admitted on 02/22/2020.  Admitted with hematemesis, acute liver failure negative for spontaneous bacterial peritonitis on paracentesis.  Also has a pleural effusion consideration for hepatic hydrothorax.  Had an EGD on 02/22/2020 that demonstrated 3 columns of grade 2 varices with stigmata of recent bleeding with red wale signs.  5 bands were placed.  Mild portal hypertensive gastropathy. S/p  diagnostic thoracocentesis-transudate .  Creatinine has normalized.   Plan: 1.  Limit dietary salt. 2. 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 If does not respond to medical management may need a TIPS procedure. 4.  Hemoglobin this morning was 6.6 g with a low platelet count and lower RBC count. Had 1 unit PRBC- no overt signs of re bleeding. Likely drop in  creatinine leading to drop in Hb.  If there is any evidence of acute bleeding then will require TIPS procedure as it would not be possible to do a repeat endoscopy as she had 5 bands placed just a few days back.  If stable and rising Repeat EGD in 2 to 3 weeks with Dr. Maximino Greenland in view of recent banding of varices.   5.    Long-term ciprofloxacin for primary prophylaxis for spontaneous bacterial peritonitis is, continue lactulose,  Aldactone 50 mg a day.IF potassium permits increase to 100 mg a day tomorrow . Expect creatinine to  drop further tomorrow  6.  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      LOS: 5 days   Wyline MoodKiran Theia Dezeeuw, MD 02/27/2020, 11:07 AM

## 2020-02-27 NOTE — Progress Notes (Signed)
PROGRESS NOTE    Bruce Bell  ERD:[id number] DOB: [birth year] DOA: 02/22/2020 PCP: [facility]    Assessment & Plan:   Principal Problem:   GI bleed Active Problems:   Hyponatremia   Decompensated liver disease (HCC)   SBP (spontaneous bacterial peritonitis) (HCC)   Hematemesis with nausea   Secondary esophageal varices with bleeding (HCC)   Portal hypertensive gastropathy (HCC)   Protein-calorie malnutrition, severe   Bruce Bell is a 34 y.o. male with medical history significant for alcoholic liver cirrhosis as well as history of SBP on chronic antibiotic therapy with ciprofloxacin who recently moved from [place] last month to the area and was discharged from [facility] about 24 hours prior to presenting to the [facility]  He was seen at [facility] for evaluation of decompensated liver cirrhosis with increasing abdominal distention and shortness of breath.  Patient underwent ultrasound-guided paracentesis while at [facility] with drainage of about 3.6 L of fluid.  Further drainage was not done due to hypotension.  Patient had prior work-up done at [facility] and was negative for hepatitis B and C with positive hepatitis A IgG, negative ASMA/AMA.  Patient also noted to have a normal AFP level.  Patient was discharged in stable condition and advised to follow-up with GI as an outpatient for EGD to screen for esophageal varices.  He also had alpha-1 antitrypsin level as well ceruloplasmin levels ordered which are pending at the time of his discharge. Patient presented to the emergency room via EMS for evaluation of abdominal pain which she described as a diffuse, sharp pain mostly in the periumbilical area and rated 10 x 10 in intensity at its worst.  Onset of pain was about 10:30 PM and was associated with 2 episodes of emesis of bright red blood.     Hematemesis 2/2 esophageal varices with bleeding S/p EGD and EVL  x5 on 02/22/20 Patient with a history of liver cirrhosis who presents to the ER after 2 episodes of hematemesis containing bright red blood Patient has not been evaluated for varices previously --started on IV octreotide (72 hours done) and protonix --Pt reported a little spit up of blood clot.  Hgb dropped to 6.6 this morning PLAN: --restart octreotide gtt  --cont protonix BID  Spontaneous bacterial peritonitis, ruled out --due to abdominal pain and ascites, pt was started on Rocephin 2 g IV daily on presentation. --ascitic fluid cell count neg for SBP, ceftriaxone d/c'ed. 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   coagulopathy and thrombocytopenia Recurrent ascites Portal HTN Patient has a meld score of 30 points with an estimated 52.6% 25-month mortality PLAN: --Hold Lasix due to increasing Cr --continue aldactone as 50 mg daily --Continue lactulose --paracentesis tomorrow --IV albumin 50g q6h  Dyspnea 2/2  Large right pleural effusion --CXR showed "Large right pleural effusion with worsened right upper lobe collapse."  --US thoracentesis 10/18 with 4.8L removed PLAN: --CXR today, showed pleural effusion accumulation already >1/2 up the right lung already  Hyponatremia --2/2 cirhosis --low sodium diet  Previous history of alcoholism Patient states that he has not had any alcohol in the last 1 year and hopes to be on transplant list   DVT prophylaxis: SCD/Compression stockings Code Status: Full code  Family Communication:  Status is: inpatient Dispo:   The patient is from: home Anticipated d/c is to: home Anticipated d/c date is: 1-2 days Patient currently is not medically stable to  d/c due to: paracentesis tomorrow, need Hgb to be stable before discharge.   Subjective and Interval History:  Pt reported a little spit up of blood clot.  Hgb dropped to 6.6, so received 1u pRBC.  Pt also complained of right chest wall  pain.  CXR showed re-accumulation of pleural effusion.    Pt complained of abdominal distention and discomfort.  Requested paracentesis.   Objective: Vitals:   02/27/20 0956 02/27/20 1120 02/27/20 1227 02/27/20 1626  BP: 111/63 112/64 (!) 127/59 (!) 113/56  Pulse: 80 94 93 (!) 101  Resp: Temp: 98 F (36.7 C)  98 F (36.7 C) 98 F (36.7 C)  TempSrc: Oral  Oral   SpO2: 99% 100%  100%  Weight:      Height:        Intake/Output Summary (Last 24 hours) at 02/27/2020 1756 Last data filed at 02/27/2020 1621 Gross per 24 hour  Intake 1417.24 ml  Output 600 ml  Net 817.24 ml   Filed Weights   02/22/20 1452 02/24/20 0443 02/24/20 1929  Weight: 72.1 kg 75.9 kg 79 kg    Examination:   Constitutional: NAD, AAOx3 HEENT: conjunctivae icterus, and lids normal, EOMI CV: No cyanosis.   RESP: normal respiratory effort, on RA GI: abdomen large and more distended Extremities: No effusions, edema in BLE SKIN: warm, dry and intact Neuro: II - XII grossly intact.   Psych: Normal mood and affect.  Appropriate judgement and reason   Data Reviewed: I have personally reviewed following labs and imaging studies  CBC: Recent Labs  Lab 02/22/20 0604 02/22/20 1651 02/24/20 0553 02/25/20 0404 02/26/20 0438 02/27/20 0410 02/27/20 1341  WBC 9.6   < > 8.8 15.3* 13.5* 7.2 6.1  NEUTROABS 6.8  --   --   --   --   --   --   HGB 9.2*   < > 7.2* 7.3* 7.6* 6.6* 7.2*  HCT 27.0*   < > 20.8* 21.6* 22.3* 19.4* 21.0*  MCV 106.3*   < > 104.5* 106.9* 106.2* 106.6* 103.4*  PLT 133*   < > 95* 110* 99* 67* 60*   < > = values in this interval not displayed.   Basic Metabolic Panel: Recent Labs  Lab 02/23/20 0512 02/24/20 0553 02/25/20 0404 02/26/20 0438 02/27/20 0410  NA 121* 121* 122* 120* 127*  K 6.0* 5.1 4.9 4.6 4.7  CL 96* 94* 94* 92* 96*  CO2 19* 21* 23 20* 23  GLUCOSE 142* 118* 93 99 94  BUN 37* 35* 48* 63* 51*  CREATININE 1.11 1.07 1.36* 1.58* 1.11  CALCIUM 8.0* 7.9* 7.6*  7.7* 8.4*  MG  --  2.1 2.1 2.2 2.3   GFR: Estimated Creatinine Clearance: 91.6 mL/min (by C-G formula based on SCr of 1.11 mg/dL). Liver Function Tests: Recent Labs  Lab 02/21/20 0513 02/22/20 0604  AST 68* 82*  ALT 50* 54*  ALKPHOS 130* 174*  BILITOT 10.7* 12.3*  PROT 6.4* 7.7  ALBUMIN 2.3* 2.8*   No results for input(s): LIPASE, AMYLASE in the last 168 hours. Recent Labs  Lab 02/22/20 0604  AMMONIA 25   Coagulation Profile: Recent Labs  Lab 02/22/20 0604 02/22/20 1405  INR 2.0* 1.7*   Cardiac Enzymes: No results for input(s): CKTOTAL, CKMB, CKMBINDEX, TROPONINI in the last 168 hours. BNP (last 3 results) No results for input(s): PROBNP in the last 8760 hours. HbA1C: No results for input(s): HGBA1C in the last 72 hours. CBG: No  results for input(s): GLUCAP in the last 168 hours. Lipid Profile: No results for input(s): CHOL, HDL, LDLCALC, TRIG, CHOLHDL, LDLDIRECT in the last 72 hours. Thyroid Function Tests: No results for input(s): TSH, T4TOTAL, FREET4, T3FREE, THYROIDAB in the last 72 hours. Anemia Panel: No results for input(s): VITAMINB12, FOLATE, FERRITIN, TIBC, IRON, RETICCTPCT in the last 72 hours. Sepsis Labs: Recent Labs  Lab 02/25/20 0404  PROCALCITON 0.15    Recent Results (from the past 240 hour(s))  Respiratory Panel by RT PCR (Flu A&B, Covid) - Nasopharyngeal Swab     Status: None   Collection Time: 02/19/20  6:04 PM   Specimen: Nasopharyngeal Swab  Result Value Ref Range Status   SARS Coronavirus 2 by RT PCR NEGATIVE NEGATIVE Final    Comment: (NOTE) SARS-CoV-2 target nucleic acids are NOT DETECTED.  The SARS-CoV-2 RNA is generally detectable in upper respiratoy specimens during the acute phase of infection. The lowest concentration of SARS-CoV-2 viral copies this assay can detect is 131 copies/mL. A negative result does not preclude SARS-Cov-2 infection and should not be used as the sole basis for treatment or other patient management  decisions. A negative result may occur with  improper specimen collection/handling, submission of specimen other than nasopharyngeal swab, presence of viral mutation(s) within the areas targeted by this assay, and inadequate number of viral copies (<131 copies/mL). A negative result must be combined with clinical observations, patient history, and epidemiological information. The expected result is Negative.  Fact Sheet for Patients:  https://www.moore.com/  Fact Sheet for Healthcare Providers:  https://www.young.biz/  This test is no t yet approved or cleared by the [place] FDA and  has been authorized for detection and/or diagnosis of SARS-CoV-2 by FDA under an Emergency Use Authorization (EUA). This EUA will remain  in effect (meaning this test can be used) for the duration of the COVID-19 declaration under Section 564(b)(1) of the Act, 21 U.S.C. section 360bbb-3(b)(1), unless the authorization is terminated or revoked sooner.     Influenza A by PCR NEGATIVE NEGATIVE Final   Influenza B by PCR NEGATIVE NEGATIVE Final    Comment: (NOTE) The Xpert Xpress SARS-CoV-2/FLU/RSV assay is intended as an aid in  the diagnosis of influenza from Nasopharyngeal swab specimens and  should not be used as a sole basis for treatment. Nasal washings and  aspirates are unacceptable for Xpert Xpress SARS-CoV-2/FLU/RSV  testing.  Fact Sheet for Patients: https://www.moore.com/  Fact Sheet for Healthcare Providers: https://www.young.biz/  This test is not yet approved or cleared by the [place] FDA and  has been authorized for detection and/or diagnosis of SARS-CoV-2 by  FDA under an Emergency Use Authorization (EUA). This EUA will remain  in effect (meaning this test can be used) for the duration of the  Covid-19 declaration under Section 564(b)(1) of the Act, 21  U.S.C. section 360bbb-3(b)(1), unless the  authorization is  terminated or revoked. Performed at [facility], [address] [place], [place] [address]   Body fluid culture     Status: None   Collection Time: 02/20/20 11:11 AM   Specimen: PATH Cytology Peritoneal fluid  Result Value Ref Range Status   Specimen Description   Final    PERITONEAL Performed at [facility], [address], [place], [place] [address]    Special Requests   Final    NONE Performed at [facility], [address] [place], [place] [address]    Gram Stain NO WBC SEEN NO  ORGANISMS SEEN CYTOSPIN SMEAR   Final   Culture   Final    NO GROWTH 3 DAYS Performed at [facility], [address], [place], [place] [address]    Report Status 02/23/2020 FINAL  Final  Culture, Urine     Status: Abnormal   Collection Time: 02/21/20  3:55 PM   Specimen: Urine, Clean Catch  Result Value Ref Range Status   Specimen Description   Final    URINE, CLEAN CATCH Performed at [facility], [address], [place], [place] [address]    Special Requests   Final    NONE Performed at [facility], [address], [place], [place] [address]    Culture MULTIPLE SPECIES PRESENT, SUGGEST RECOLLECTION (A)  Final   Report Status 02/22/2020 FINAL  Final  Body fluid culture     Status: None   Collection Time: 02/22/20  6:20 AM   Specimen: Peritoneal Washings; Body Fluid  Result Value Ref Range Status   Specimen Description   Final    PERITONEAL Performed at [facility], [address] [place], [place] [address]    Special Requests   Final    NONE Performed at [facility], [address] [place], [place] [address]    Gram Stain   Final    WBC PRESENT, PREDOMINANTLY MONONUCLEAR NO ORGANISMS SEEN CYTOSPIN SMEAR    Culture   Final    NO GROWTH 3 DAYS Performed at [facility], [address], [place], [place] [address]    Report Status  02/25/2020 FINAL  Final  Respiratory Panel by RT PCR (Flu A&B, Covid) - Nasopharyngeal Swab     Status: None   Collection Time: 02/22/20  6:37 AM   Specimen: Nasopharyngeal Swab  Result Value Ref Range Status   SARS Coronavirus 2 by RT PCR NEGATIVE NEGATIVE Final    Comment: (NOTE) SARS-CoV-2 target nucleic acids are NOT DETECTED.  The SARS-CoV-2 RNA is generally detectable in upper respiratoy specimens during the acute phase of infection. The lowest concentration of SARS-CoV-2 viral copies this assay can detect is 131 copies/mL. A negative result does not preclude SARS-Cov-2 infection and should not be used as the sole basis for treatment or other patient management decisions. A negative result may occur with  improper specimen collection/handling, submission of specimen other than nasopharyngeal swab, presence of viral mutation(s) within the areas targeted by this assay, and inadequate number of viral copies (<131 copies/mL). A negative result must be combined with clinical observations, patient history, and epidemiological information. The expected result is Negative.  Fact Sheet for Patients:  https://www.moore.com/  Fact Sheet for Healthcare Providers:  https://www.young.biz/  This test is no t yet approved or cleared by the [place] FDA and  has been authorized for detection and/or diagnosis of SARS-CoV-2 by FDA under an Emergency Use Authorization (EUA). This EUA will remain  in effect (meaning this test can be used) for the duration of the COVID-19 declaration under Section 564(b)(1) of the Act, 21 U.S.C. section 360bbb-3(b)(1), unless the authorization is terminated or revoked sooner.     Influenza A by PCR NEGATIVE NEGATIVE Final   Influenza B by PCR NEGATIVE NEGATIVE Final    Comment: (NOTE) The Xpert Xpress SARS-CoV-2/FLU/RSV assay is intended as an aid in  the diagnosis of influenza from Nasopharyngeal swab specimens and   should not be used as a sole basis for treatment. Nasal washings and  aspirates are unacceptable for  Xpert Xpress SARS-CoV-2/FLU/RSV  testing.  Fact Sheet for Patients: https://www.moore.com/  Fact Sheet for Healthcare Providers: https://www.young.biz/  This test is not yet approved or cleared by the [place] FDA and  has been authorized for detection and/or diagnosis of SARS-CoV-2 by  FDA under an Emergency Use Authorization (EUA). This EUA will remain  in effect (meaning this test can be used) for the duration of the  Covid-19 declaration under Section 564(b)(1) of the Act, 21  U.S.C. section 360bbb-3(b)(1), unless the authorization is  terminated or revoked. Performed at [facility], [address] [place], [place] [address]   Body fluid culture     Status: None (Preliminary result)   Collection Time: 02/26/20  9:20 AM   Specimen: PATH Cytology Pleural fluid  Result Value Ref Range Status   Specimen Description   Final    PLEURAL Performed at [facility], [address] [place], [place] [address]    Special Requests   Final    PLEURAL Performed at [facility], [address] [place], [place] [address]    Gram Stain NO WBC SEEN NO ORGANISMS SEEN CYTOSPIN SMEAR   Final   Culture   Final    NO GROWTH < 24 HOURS Performed at [facility], [address], [place], [place] [address]    Report Status PENDING  Incomplete      Radiology Studies: DG Chest 2 View  Result Date: 02/27/2020 CLINICAL DATA:  RIGHT chest wall pain since thoracentesis performed yesterday EXAM: CHEST - 2 VIEW COMPARISON:  02/26/2020 FINDINGS: Enlargement of cardiac silhouette. Mediastinal contours normal. Increased RIGHT pleural effusion and lower lung atelectasis since prior study. RIGHT perihilar opacity seen on previous exam obscured by atelectasis. No pneumothorax. LEFT lung clear. IMPRESSION: Recurrent RIGHT  pleural effusion and basilar atelectasis. Electronically Signed   By: Ulyses Southward M.D.   On: 02/27/2020 09:32   DG Chest Port 1 View  Result Date: 02/26/2020 CLINICAL DATA:  Right thoracentesis EXAM: PORTABLE CHEST 1 VIEW COMPARISON:  02/20/2020 FINDINGS: Effectively resolved right-sided pleural effusion. There is a focal right perihilar opacity at a level previously obscured by pleural fluid, up to 4.7 cm. Normal heart size. No pneumothorax. IMPRESSION: 1. Marked decrease in right pleural effusion. No complicating features. 2. Right perihilar opacity, at a level that was obscured on comparison radiographs. This could reflect mass, adenopathy, or fissural fluid. Recommend follow-up versus chest CT. Electronically Signed   By: Marnee Spring M.D.   On: 02/26/2020 09:46   US THORACENTESIS ASP PLEURAL SPACE W/IMG GUIDE  Result Date: 02/26/2020 INDICATION: CIRRHOSIS, LARGE RIGHT EFFUSION COMPATIBLE WITH HEPATIC HYDROTHORAX EXAM: ULTRASOUND GUIDED RIGHT THORACENTESIS MEDICATIONS: 1% lidocaine local COMPLICATIONS: None immediate. PROCEDURE: An ultrasound guided thoracentesis was thoroughly discussed with the patient and questions answered. The benefits, risks, alternatives and complications were also discussed. The patient understands and wishes to proceed with the procedure. Written consent was obtained. Ultrasound was performed to localize and mark an adequate pocket of fluid in the right chest. The area was then prepped and draped in the normal sterile fashion. 1% Lidocaine was used for local anesthesia. Under ultrasound guidance a 6 Fr Safe-T-Centesis catheter was introduced. Thoracentesis was performed. The catheter was removed and a dressing applied. FINDINGS: A total of approximately 4.8 L of amber colored pleural fluid was removed. Samples were sent to the laboratory as requested by the clinical team. IMPRESSION: Successful ultrasound guided right thoracentesis yielding 4.8 L of pleural fluid.  Electronically Signed  By: Osvaldo ShipperM.  Shick M.D.   On: 02/26/2020 09:46     Scheduled Meds:  sodium chloride   Intravenous Once   chlorhexidine  15 mL Mouth Rinse BID   ciprofloxacin  500 mg Oral Daily   feeding supplement  237 mL Oral TID BM   guaiFENesin  600 mg Oral BID   lactulose  10 g Oral TID   mouth rinse  15 mL Mouth Rinse q12n4p   multivitamin with minerals  1 tablet Oral Daily   pantoprazole  40 mg Oral BID   sodium chloride flush  3 mL Intravenous Q12H   spironolactone  50 mg Oral Daily   Continuous Infusions:  sodium chloride     albumin human 50 g (02/27/20 1730)   octreotide  (SANDOSTATIN)    IV infusion 50 mcg/hr (02/27/20 1621)     LOS: 5 days     Darlin Priestlyina Hania Cerone, MD [facility] If 7PM-7AM, please contact night-coverage 02/27/2020, 5:56 PM

## 2020-02-27 NOTE — Progress Notes (Signed)
Dr. Para March noted acknowledgement. Will continue to monitor. Windy Carina, RN 6:09 AM 02/27/2020

## 2020-02-27 NOTE — Progress Notes (Signed)
Noticed blood on bottom sheet; patient stated that he coughed up some blood...".Marland Kitchen. a clot, when I touched it, it spread out..."  Platelets 67 this am; Also stated that he has a "prick" in his Right lung with deep breaths. Doylene Canard, MD, on-call provider per Loretha Stapler, notified via secure chat; awaiting acknowledgement. Windy Carina, RN; 5:55 AM;02/27/2020

## 2020-02-27 NOTE — Progress Notes (Signed)
Specimen cup given to patient, along with tissues and spit basin; instructed to cough any blood into specimen cup and call and notify staff/nurse; will continue to monitor for more bleeding. Windy Carina, RN 5:59 AM 02/27/2020

## 2020-02-28 ENCOUNTER — Inpatient Hospital Stay: Payer: Commercial Managed Care - HMO

## 2020-02-28 LAB — BASIC METABOLIC PANEL
Anion gap: 7 (ref 5–15)
BUN: 32 mg/dL — ABNORMAL HIGH (ref 6–20)
CO2: 25 mmol/L (ref 22–32)
Calcium: 8.7 mg/dL — ABNORMAL LOW (ref 8.9–10.3)
Chloride: 99 mmol/L (ref 98–111)
Creatinine, Ser: 0.87 mg/dL (ref 0.61–1.24)
GFR, Estimated: >60 mL/min (ref >60)
Glucose, Bld: 127 mg/dL — ABNORMAL HIGH (ref 70–99)
Potassium: 4.3 mmol/L (ref 3.5–5.1)
Sodium: 131 mmol/L — ABNORMAL LOW (ref 135–145)

## 2020-02-28 LAB — CBC
HCT: 21.6 % — ABNORMAL LOW (ref 39.0–52.0)
Hemoglobin: 7.4 g/dL — ABNORMAL LOW (ref 13.0–17.0)
MCH: 35.9 pg — ABNORMAL HIGH (ref 26.0–34.0)
MCHC: 34.3 g/dL (ref 30.0–36.0)
MCV: 104.9 fL — ABNORMAL HIGH (ref 80.0–100.0)
Platelets: 56 10*3/uL — ABNORMAL LOW (ref 150–400)
RBC: 2.06 MIL/uL — ABNORMAL LOW (ref 4.22–5.81)
RDW: 17.3 % — ABNORMAL HIGH (ref 11.5–15.5)
WBC: 5.9 10*3/uL (ref 4.0–10.5)
nRBC: 0 % (ref 0.0–0.2)

## 2020-02-28 LAB — HEPATIC FUNCTION PANEL
ALT: 26 U/L (ref 0–44)
AST: 42 U/L — ABNORMAL HIGH (ref 15–41)
Albumin: 4.5 g/dL (ref 3.5–5.0)
Alkaline Phosphatase: 84 U/L (ref 38–126)
Bilirubin, Direct: 2.7 mg/dL — ABNORMAL HIGH (ref 0.0–0.2)
Indirect Bilirubin: 8.5 mg/dL — ABNORMAL HIGH (ref 0.3–0.9)
Total Bilirubin: 11.2 mg/dL — ABNORMAL HIGH (ref 0.3–1.2)
Total Protein: 6.5 g/dL (ref 6.5–8.1)

## 2020-02-28 LAB — MAGNESIUM: Magnesium: 2.3 mg/dL (ref 1.7–2.4)

## 2020-02-28 MED ORDER — MELATONIN 5 MG PO TABS
5.0000 mg | ORAL_TABLET | Freq: Every day | ORAL | Status: DC
  Administered 2020-02-28 – 2020-02-29 (×2): 5 mg via ORAL
  Filled 2020-02-28 (×2): qty 1

## 2020-02-28 MED ORDER — SPIRONOLACTONE 25 MG PO TABS
100.0000 mg | ORAL_TABLET | Freq: Every day | ORAL | Status: DC
  Administered 2020-02-28: 50 mg via ORAL
  Administered 2020-02-29 – 2020-03-01 (×2): 100 mg via ORAL
  Filled 2020-02-28 (×3): qty 4

## 2020-02-28 NOTE — Progress Notes (Signed)
Patient ID: Bruce Bell, male   DOB: [birth year], 34 y.o.   MRN: [id number]   Patient expressed interest in TIPS procedure during today's ultrasound-guided paracentesis based on previous conversations with the gastroenterologist service.    As such, preliminary conversations were held with the patient regarding indications for TIPS as well as risks and benefits, including but not limited to bleeding, worsening liver function and/or development/worsening of hepatic encephalopathy.    I explained that TIPS could be performed both for attempting to control his recurrent symptomatic ascites as well as potential for variceal embolization, however fortunately, it appears his upper GI bleed has been controlled with endoscopic intervention.  I explained that his candidacy would be based on an appropriate NA MELD score however unfortunately, based on most recent laboratory values the patient's meld score is 26, largely driven by markedly elevated bilirubin however this value was acquired approximately 1 week ago.  I explained that iIdeally patient's NA MELD score would be in the mid teens to undergo elective TIPS creation.  I explained that his the TIPS was ultimately hospital seizure  Above conversations were held with Dr. Tobi Bastos (GI) who is hopeful of the patient will continue to improve and respond to medical management  If it determines the patient is still requiring frequent large-volume paracenteses and/or thoracenteses despite medical optimization would recommend referral to interventional radiology clinic [phone]) for evaluation and management and consideration for elective TIPS creation.  Note, the patient would require a preprocedural BRTO CT as well as cardiac echo prior to undergoing elective TIPS.  Katherina Right, MD Pager #: [phone]

## 2020-02-28 NOTE — Progress Notes (Signed)
Labs reviewed 1.  Potassium lowered and creatinine has improved 2.  Hemoglobin stable  Plan 1.  Increase Aldactone from 50 mg tto 100 mg a day which I have placed the orders 2.  Proceed with therapeutic abdominal paracentesis with albumin cover 3.  Salt restriction 4.  Advance diet 5.  If stable tomorrow can start planning for discharge either Thursday or Friday 6.  Continue PPI which can be changed to oral later today  Dr [name] MD,MRCP [facility]) Gastroenterology/Hepatology Pager: [phone]

## 2020-02-28 NOTE — Progress Notes (Signed)
PROGRESS NOTE    Bruce Bell  ZOX:[id number] DOB: 10-02-1985 DOA: 02/22/2020 PCP: [facility]    Assessment & Plan:   Principal Problem:   GI bleed Active Problems:   Hyponatremia   Decompensated liver disease (HCC)   SBP (spontaneous bacterial peritonitis) (HCC)   Hematemesis with nausea   Secondary esophageal varices with bleeding (HCC)   Portal hypertensive gastropathy (HCC)   Protein-calorie malnutrition, severe   Bruce Bell is a 34 y.o. male with medical history significant for alcoholic liver cirrhosis as well as history of SBP on chronic antibiotic therapy with ciprofloxacin who recently moved from [place] last month to the area and was discharged from [facility] about 24 hours prior to presenting to the [facility].  He was seen at [facility] for evaluation of decompensated liver cirrhosis with increasing abdominal distention and shortness of breath.  Patient underwent ultrasound-guided paracentesis while at [facility] with drainage of about 3.6 L of fluid.  Further drainage was not done due to hypotension.  Patient had prior work-up done at [facility] and was negative for hepatitis B and C with positive hepatitis A IgG, negative ASMA/AMA.  Patient also noted to have a normal AFP level.  Patient was discharged in stable condition and advised to follow-up with GI as an outpatient for EGD to screen for esophageal varices.  He also had alpha-1 antitrypsin level as well ceruloplasmin levels ordered which are pending at the time of his discharge. Patient presented to the emergency room via EMS for evaluation of abdominal pain which she described as a diffuse, sharp pain mostly in the periumbilical area and rated 10 x 10 in intensity at its worst.  Onset of pain was about 10:30 PM and was associated with 2 episodes of emesis of bright red blood.     Hematemesis 2/2 esophageal varices with bleeding S/p EGD and EVL  x5 on 02/22/20 Patient with a history of liver cirrhosis who presents to the ER after 2 episodes of hematemesis containing bright red blood Patient has not been evaluated for varices previously --started on IV octreotide (72 hours done) and protonix --Pt reported a little spit up of blood clot.  Hgb dropped to 6.6 morning of 10/19, received 1u pRBC, octreotide gtt restarted per GI rec. PLAN: --cont octreotide gtt --cont protonix BID  Spontaneous bacterial peritonitis, ruled out --due to abdominal pain and ascites, pt was started on Rocephin 2 g IV daily on presentation. --ascitic fluid cell count neg for SBP, ceftriaxone d/c'ed. 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 Maximino Greenland as outpatient, who will refer pt to [facility] for transplant consideration.  Dyspnea 2/2  Large right pleural effusion --CXR showed "Large right pleural effusion with worsened right upper lobe collapse."  --US thoracentesis 10/18 with 4.8L removed --CXR 10/19, showed pleural effusion accumulation already >1/2 up the right lung already PLAN: --cont aldactone and Na restriction to reduce fluid buildup  Hyponatremia --2/2 cirhosis --need to be on low sodium diet  Previous history of alcoholism Patient states that he has not had any alcohol in the last  1 year and hopes to be on transplant list   DVT prophylaxis: SCD/Compression stockings Code Status: Full code  Family Communication:  Status is: inpatient Dispo:   The patient is from: home Anticipated d/c is to: home Anticipated d/c  date is: 1-2 days Patient currently is not medically stable to d/c due to: GI to clear for discharge.   Subjective and Interval History:  Received paracentesis today with 4.8L removed.  Pt felt much relief afterwards.    All labs improving, except for plt which dropped.  Pt continued to have gum bleeding.     Objective: Vitals:   02/28/20 1122 02/28/20 1228 02/28/20 1622 02/28/20 1714  BP: 115/69 105/60 109/65 114/69  Pulse: 93 88 100 97  Resp: Temp:  97.8 F (36.6 C) 98 F (36.7 C) 98 F (36.7 C)  TempSrc:  Oral Oral Oral  SpO2: 98% 100% 100% 98%  Weight:      Height:        Intake/Output Summary (Last 24 hours) at 02/28/2020 2001 Last data filed at 02/28/2020 1900 Gross per 24 hour  Intake 636.71 ml  Output 1600 ml  Net -963.29 ml   Filed Weights   02/22/20 1452 02/24/20 0443 02/24/20 1929  Weight: 72.1 kg 75.9 kg 79 kg    Examination:   Constitutional: NAD, AAOx3 HEENT: conjunctivae icterus, and lids normal, EOMI CV: No cyanosis.   RESP: normal respiratory effort, on RA GI: abdomen soft now, NT Extremities: No effusions, edema in BLE SKIN: warm, dry and intact Neuro: II - XII grossly intact.   Psych: Normal mood and affect.  Appropriate judgement and reason   Data Reviewed: I have personally reviewed following labs and imaging studies  CBC: Recent Labs  Lab 02/22/20 0604 02/22/20 1651 02/25/20 0404 02/26/20 0438 02/27/20 0410 02/27/20 1341 02/28/20 0409  WBC 9.6   < > 15.3* 13.5* 7.2 6.1 5.9  NEUTROABS 6.8  --   --   --   --   --   --   HGB 9.2*   < > 7.3* 7.6* 6.6* 7.2* 7.4*  HCT 27.0*   < > 21.6* 22.3* 19.4* 21.0* 21.6*  MCV 106.3*   < > 106.9* 106.2* 106.6* 103.4* 104.9*  PLT 133*   < > 110* 99* 67* 60* 56*   < > = values in this interval not displayed.   Basic Metabolic Panel: Recent Labs  Lab 02/24/20 0553 02/25/20 0404 02/26/20 0438 02/27/20 0410 02/28/20 0409  NA 121* 122* 120* 127* 131*  K 5.1 4.9 4.6 4.7 4.3  CL  94* 94* 92* 96* 99  CO2 21* 23 20* 23 25  GLUCOSE 118* 93 99 94 127*  BUN 35* 48* 63* 51* 32*  CREATININE 1.07 1.36* 1.58* 1.11 0.87  CALCIUM 7.9* 7.6* 7.7* 8.4* 8.7*  MG 2.1 2.1 2.2 2.3 2.3   GFR: Estimated Creatinine Clearance: 116.8 mL/min (by C-G formula based on SCr of 0.87 mg/dL). Liver Function Tests: Recent Labs  Lab 02/22/20 0604 02/28/20 0406  AST 82* 42*  ALT 54* 26  ALKPHOS 174* 84  BILITOT 12.3* 11.2*  PROT 7.7 6.5  ALBUMIN 2.8* 4.5   No results for input(s): LIPASE, AMYLASE in the last 168 hours. Recent Labs  Lab 02/22/20 0604  AMMONIA 25   Coagulation Profile: Recent Labs  Lab 02/22/20 0604 02/22/20 1405  INR 2.0* 1.7*   Cardiac Enzymes: No results for input(s): CKTOTAL, CKMB, CKMBINDEX, TROPONINI in the last 168 hours.  BNP (last 3 results) No results for input(s): PROBNP in the last 8760 hours. HbA1C: No results for input(s): HGBA1C in the last 72 hours. CBG: No results for input(s): GLUCAP in the last 168 hours. Lipid Profile: No results for input(s): CHOL, HDL, LDLCALC, TRIG, CHOLHDL, LDLDIRECT in the last 72 hours. Thyroid Function Tests: No results for input(s): TSH, T4TOTAL, FREET4, T3FREE, THYROIDAB in the last 72 hours. Anemia Panel: No results for input(s): VITAMINB12, FOLATE, FERRITIN, TIBC, IRON, RETICCTPCT in the last 72 hours. Sepsis Labs: Recent Labs  Lab 02/25/20 0404  PROCALCITON 0.15    Recent Results (from the past 240 hour(s))  Respiratory Panel by RT PCR (Flu A&B, Covid) - Nasopharyngeal Swab     Status: None   Collection Time: 02/19/20  6:04 PM   Specimen: Nasopharyngeal Swab  Result Value Ref Range Status   SARS Coronavirus 2 by RT PCR NEGATIVE NEGATIVE Final    Comment: (NOTE) SARS-CoV-2 target nucleic acids are NOT DETECTED.  The SARS-CoV-2 RNA is generally detectable in upper respiratoy specimens during the acute phase of infection. The lowest concentration of SARS-CoV-2 viral copies this assay can detect  is 131 copies/mL. A negative result does not preclude SARS-Cov-2 infection and should not be used as the sole basis for treatment or other patient management decisions. A negative result may occur with  improper specimen collection/handling, submission of specimen other than nasopharyngeal swab, presence of viral mutation(s) within the areas targeted by this assay, and inadequate number of viral copies (<131 copies/mL). A negative result must be combined with clinical observations, patient history, and epidemiological information. The expected result is Negative.  Fact Sheet for Patients:  https://www.moore.com/https://www.fda.gov/media/142436/download  Fact Sheet for Healthcare Providers:  https://www.young.biz/https://www.fda.gov/media/142435/download  This test is no t yet approved or cleared by the [place] FDA and  has been authorized for detection and/or diagnosis of SARS-CoV-2 by FDA under an Emergency Use Authorization (EUA). This EUA will remain  in effect (meaning this test can be used) for the duration of the COVID-19 declaration under Section 564(b)(1) of the Act, 21 U.S.C. section 360bbb-3(b)(1), unless the authorization is terminated or revoked sooner.     Influenza A by PCR NEGATIVE NEGATIVE Final   Influenza B by PCR NEGATIVE NEGATIVE Final    Comment: (NOTE) The Xpert Xpress SARS-CoV-2/FLU/RSV assay is intended as an aid in  the diagnosis of influenza from Nasopharyngeal swab specimens and  should not be used as a sole basis for treatment. Nasal washings and  aspirates are unacceptable for Xpert Xpress SARS-CoV-2/FLU/RSV  testing.  Fact Sheet for Patients: https://www.moore.com/https://www.fda.gov/media/142436/download  Fact Sheet for Healthcare Providers: https://www.young.biz/https://www.fda.gov/media/142435/download  This test is not yet approved or cleared by the [place] FDA and  has been authorized for detection and/or diagnosis of SARS-CoV-2 by  FDA under an Emergency Use Authorization (EUA). This EUA will remain  in effect  (meaning this test can be used) for the duration of the  Covid-19 declaration under Section 564(b)(1) of the Act, 21  U.S.C. section 360bbb-3(b)(1), unless the authorization is  terminated or revoked. Performed at [facility] [place] [facility], [address] [place], [place] [address]   Body fluid culture     Status: None   Collection Time: 02/20/20 11:11 AM   Specimen: PATH Cytology Peritoneal fluid  Result Value Ref Range Status   Specimen Description   Final    PERITONEAL Performed at [facility], [address] [place], [place] [address]    Special Requests  Final    NONE Performed at [facility], [address] [place], [place] [address]    Gram Stain NO WBC SEEN NO ORGANISMS SEEN CYTOSPIN SMEAR   Final   Culture   Final    NO GROWTH 3 DAYS Performed at [facility], [address], [place], [place] [address]    Report Status 02/23/2020 FINAL  Final  Culture, Urine     Status: Abnormal   Collection Time: 02/21/20  3:55 PM   Specimen: Urine, Clean Catch  Result Value Ref Range Status   Specimen Description   Final    URINE, CLEAN CATCH Performed at [facility], [address] [place], [place] [address]    Special Requests   Final    NONE Performed at [facility], [address], [place], [place] [address]    Culture MULTIPLE SPECIES PRESENT, SUGGEST RECOLLECTION (A)  Final   Report Status 02/22/2020 FINAL  Final  Body fluid culture     Status: None   Collection Time: 02/22/20  6:20 AM   Specimen: Peritoneal Washings; Body Fluid  Result Value Ref Range Status   Specimen Description   Final    PERITONEAL Performed at [facility], [address] [place], [place] [address]    Special Requests   Final    NONE Performed at [facility], [address] [place], [place] [address]    Gram Stain   Final    WBC PRESENT, PREDOMINANTLY MONONUCLEAR NO ORGANISMS  SEEN CYTOSPIN SMEAR    Culture   Final    NO GROWTH 3 DAYS Performed at [facility], [address] [place], [place] [address]    Report Status 02/25/2020 FINAL  Final  Respiratory Panel by RT PCR (Flu A&B, Covid) - Nasopharyngeal Swab     Status: None   Collection Time: 02/22/20  6:37 AM   Specimen: Nasopharyngeal Swab  Result Value Ref Range Status   SARS Coronavirus 2 by RT PCR NEGATIVE NEGATIVE Final    Comment: (NOTE) SARS-CoV-2 target nucleic acids are NOT DETECTED.  The SARS-CoV-2 RNA is generally detectable in upper respiratoy specimens during the acute phase of infection. The lowest concentration of SARS-CoV-2 viral copies this assay can detect is 131 copies/mL. A negative result does not preclude SARS-Cov-2 infection and should not be used as the sole basis for treatment or other patient management decisions. A negative result may occur with  improper specimen collection/handling, submission of specimen other than nasopharyngeal swab, presence of viral mutation(s) within the areas targeted by this assay, and inadequate number of viral copies (<131 copies/mL). A negative result must be combined with clinical observations, patient history, and epidemiological information. The expected result is Negative.  Fact Sheet for Patients:  https://www.moore.com/  Fact Sheet for Healthcare Providers:  https://www.young.biz/  This test is no t yet approved or cleared by the [place] FDA and  has been authorized for detection and/or diagnosis of SARS-CoV-2 by FDA under an Emergency Use Authorization (EUA). This EUA will remain  in effect (meaning this test can be used) for the duration of the COVID-19 declaration under Section 564(b)(1) of the Act, 21 U.S.C. section 360bbb-3(b)(1), unless the authorization is terminated or revoked sooner.     Influenza A by PCR NEGATIVE NEGATIVE Final   Influenza B by PCR NEGATIVE NEGATIVE  Final    Comment: (NOTE) The Xpert Xpress SARS-CoV-2/FLU/RSV assay is intended as an aid in  the  diagnosis of influenza from Nasopharyngeal swab specimens and  should not be used as a sole basis for treatment. Nasal washings and  aspirates are unacceptable for Xpert Xpress SARS-CoV-2/FLU/RSV  testing.  Fact Sheet for Patients: https://www.moore.com/  Fact Sheet for Healthcare Providers: https://www.young.biz/  This test is not yet approved or cleared by the [place] FDA and  has been authorized for detection and/or diagnosis of SARS-CoV-2 by  FDA under an Emergency Use Authorization (EUA). This EUA will remain  in effect (meaning this test can be used) for the duration of the  Covid-19 declaration under Section 564(b)(1) of the Act, 21  U.S.C. section 360bbb-3(b)(1), unless the authorization is  terminated or revoked. Performed at [facility], [address] [place], [place] [address]   Body fluid culture     Status: None (Preliminary result)   Collection Time: 02/26/20  9:20 AM   Specimen: PATH Cytology Pleural fluid  Result Value Ref Range Status   Specimen Description   Final    PLEURAL Performed at [facility], [address], [place], [place] [address]    Special Requests   Final    PLEURAL Performed at [facility], [address] [place], [place] [address]    Gram Stain NO WBC SEEN NO ORGANISMS SEEN CYTOSPIN SMEAR   Final   Culture   Final    NO GROWTH 2 DAYS Performed at [facility], [address], [place], [place] [address]    Report Status PENDING  Incomplete      Radiology Studies: DG Chest 2 View  Result Date: 02/27/2020 CLINICAL DATA:  RIGHT chest wall pain since thoracentesis performed yesterday EXAM: CHEST - 2 VIEW COMPARISON:  02/26/2020 FINDINGS: Enlargement of cardiac silhouette. Mediastinal contours normal. Increased RIGHT pleural effusion and lower lung  atelectasis since prior study. RIGHT perihilar opacity seen on previous exam obscured by atelectasis. No pneumothorax. LEFT lung clear. IMPRESSION: Recurrent RIGHT pleural effusion and basilar atelectasis. Electronically Signed   By: Ulyses Southward M.D.   On: 02/27/2020 09:32   US Paracentesis  Result Date: 02/28/2020 INDICATION: History of cirrhosis with recurrent symptomatic ascites. Please perform ultrasound-guided paracentesis for therapeutic purposes. EXAM: ULTRASOUND-GUIDED PARACENTESIS COMPARISON:  Multiple previous ultrasound-guided paracenteses, most recently on 02/20/2020 yielding 3.6 L of ascitic fluid. MEDICATIONS: None. COMPLICATIONS: None immediate. TECHNIQUE: Informed written consent was obtained from the patient after a discussion of the risks, benefits and alternatives to treatment. A timeout was performed prior to the initiation of the procedure. Initial ultrasound scanning demonstrates a large amount of ascites within the right lower abdomen which was subsequently prepped and draped in the usual sterile fashion. 1% lidocaine with epinephrine was used for local anesthesia. An ultrasound image was saved for documentation purposed. An 8 Fr Safe-T-Centesis catheter was introduced. The paracentesis was performed. The catheter was removed and a dressing was applied. The patient tolerated the procedure well without immediate post procedural complication. FINDINGS: A total of approximately 4.8 liters of serous fluid was removed. IMPRESSION: Successful ultrasound-guided paracentesis yielding 4.8 liters of peritoneal fluid. Electronically Signed   By: Simonne Come M.D.   On: 02/28/2020 12:24     Scheduled Meds: . sodium chloride   Intravenous Once  . chlorhexidine  15 mL Mouth Rinse BID  . ciprofloxacin  500 mg Oral Daily  . feeding supplement  237 mL Oral TID BM  . guaiFENesin  600 mg Oral BID  . lactulose  10 g Oral TID  . mouth rinse  15 mL Mouth Rinse q12n4p  . multivitamin with minerals  1  tablet Oral Daily  . pantoprazole  40 mg Oral BID  . sodium chloride flush  3 mL Intravenous Q12H  . spironolactone  100 mg Oral Daily   Continuous Infusions: . sodium chloride    . albumin human 50 g (02/28/20 1748)  . octreotide  (SANDOSTATIN)    IV infusion 50 mcg/hr (02/28/20 1523)     LOS: 6 days     Darlin Priestly, MD [facility] If 7PM-7AM, please contact night-coverage 02/28/2020, 8:01 PM

## 2020-02-28 NOTE — Procedures (Signed)
Pre Procedural Dx: Symptomatic Ascites Post Procedural Dx: Same  Successful US guided paracentesis yielding 4.8 L of serous ascitic fluid.  EBL: None Complications: None immediate  Jay Khori Underberg, MD Pager #: [phone]   

## 2020-02-29 ENCOUNTER — Inpatient Hospital Stay: Payer: Commercial Managed Care - HMO

## 2020-02-29 DIAGNOSIS — R188 Other ascites: Secondary | ICD-10-CM

## 2020-02-29 DIAGNOSIS — R0602 Shortness of breath: Secondary | ICD-10-CM

## 2020-02-29 DIAGNOSIS — K7031 Alcoholic cirrhosis of liver with ascites: Principal | ICD-10-CM

## 2020-02-29 DIAGNOSIS — Z9889 Other specified postprocedural states: Secondary | ICD-10-CM

## 2020-02-29 DIAGNOSIS — E43 Unspecified severe protein-calorie malnutrition: Secondary | ICD-10-CM

## 2020-02-29 LAB — CBC WITH DIFFERENTIAL/PLATELET
Abs Immature Granulocytes: 0.18 10*3/uL — ABNORMAL HIGH (ref 0.00–0.07)
Basophils Absolute: 0 10*3/uL (ref 0.0–0.1)
Basophils Relative: 1 %
Eosinophils Absolute: 0.2 10*3/uL (ref 0.0–0.5)
Eosinophils Relative: 4 %
HCT: 22.1 % — ABNORMAL LOW (ref 39.0–52.0)
Hemoglobin: 7.6 g/dL — ABNORMAL LOW (ref 13.0–17.0)
Immature Granulocytes: 3 %
Lymphocytes Relative: 12 %
Lymphs Abs: 0.8 10*3/uL (ref 0.7–4.0)
MCH: 34.9 pg — ABNORMAL HIGH (ref 26.0–34.0)
MCHC: 34.4 g/dL (ref 30.0–36.0)
MCV: 101.4 fL — ABNORMAL HIGH (ref 80.0–100.0)
Monocytes Absolute: 0.7 10*3/uL (ref 0.1–1.0)
Monocytes Relative: 11 %
Neutro Abs: 4.6 10*3/uL (ref 1.7–7.7)
Neutrophils Relative %: 69 %
Platelets: 55 10*3/uL — ABNORMAL LOW (ref 150–400)
RBC: 2.18 MIL/uL — ABNORMAL LOW (ref 4.22–5.81)
RDW: 19.5 % — ABNORMAL HIGH (ref 11.5–15.5)
WBC: 6.6 10*3/uL (ref 4.0–10.5)
nRBC: 0 % (ref 0.0–0.2)

## 2020-02-29 LAB — BASIC METABOLIC PANEL
Anion gap: 7 (ref 5–15)
BUN: 19 mg/dL (ref 6–20)
CO2: 24 mmol/L (ref 22–32)
Calcium: 8.7 mg/dL — ABNORMAL LOW (ref 8.9–10.3)
Chloride: 101 mmol/L (ref 98–111)
Creatinine, Ser: 0.48 mg/dL — ABNORMAL LOW (ref 0.61–1.24)
GFR, Estimated: >60 mL/min (ref >60)
Glucose, Bld: 85 mg/dL (ref 70–99)
Potassium: 4.7 mmol/L (ref 3.5–5.1)
Sodium: 132 mmol/L — ABNORMAL LOW (ref 135–145)

## 2020-02-29 LAB — BODY FLUID CULTURE
Culture: NO GROWTH
Gram Stain: NONE SEEN

## 2020-02-29 LAB — PREPARE RBC (CROSSMATCH)

## 2020-02-29 LAB — COMP PANEL: LEUKEMIA/LYMPHOMA

## 2020-02-29 LAB — CBC
HCT: 20.7 % — ABNORMAL LOW (ref 39.0–52.0)
Hemoglobin: 6.9 g/dL — ABNORMAL LOW (ref 13.0–17.0)
MCH: 35 pg — ABNORMAL HIGH (ref 26.0–34.0)
MCHC: 33.3 g/dL (ref 30.0–36.0)
MCV: 105.1 fL — ABNORMAL HIGH (ref 80.0–100.0)
Platelets: 47 10*3/uL — ABNORMAL LOW (ref 150–400)
RBC: 1.97 MIL/uL — ABNORMAL LOW (ref 4.22–5.81)
RDW: 17 % — ABNORMAL HIGH (ref 11.5–15.5)
WBC: 5.9 10*3/uL (ref 4.0–10.5)
nRBC: 0 % (ref 0.0–0.2)

## 2020-02-29 LAB — MAGNESIUM: Magnesium: 2.2 mg/dL (ref 1.7–2.4)

## 2020-02-29 MED ORDER — SODIUM CHLORIDE 0.9% IV SOLUTION: Freq: Once | INTRAVENOUS | Status: AC

## 2020-02-29 NOTE — Progress Notes (Signed)
PROGRESS NOTE    Bruce Bell  YPP:[id number] DOB: July 09, 1985 DOA: 02/22/2020 PCP: [facility]   Brief Narrative:  Bruce Bell a 34 y.o.malewith medical history significant foralcoholic liver cirrhosisas well ashistory of SBP on chronic antibiotic therapy with ciprofloxacinwho recently moved from [place] last month to the areaand was discharged from [place] [facility] about 24 hours prior to presenting to the [facility] He was seen at [facility] for evaluation of decompensated liver cirrhosis withincreasing abdominal distention and shortness of breath. Patient underwent ultrasound-guided paracentesis while at [facility] long with drainage of about 3.6 L of fluid. Further drainage was not done due to hypotension. Patient had prior work-up done at [facility] and was negative for hepatitis B and C with positive hepatitis A IgG, negative ASMA/AMA.Patient also noted to have a normal AFP level.  Alpha 1 antitrypsin and ceruloplasmin tested during recent hospitalization was within normal limit. Patient was discharged in stable condition and advised to follow-up with GI as an outpatient for EGD to screen for esophageal varices.  Presented to ED for evaluation of diffuse, sharp abdominal pain.  Which started overnight after admission.  Associated with couple of episodes of emesis of bright red blood. Admitted for variceal bleed, EGD was done and 5 bands were placed. Also evaluated for TIPS procedure and thought to be high risk due to high meld score.  GI is trying to get him to [facility] for liver transplant list. Received 1 unit of PRBC and fresh frozen plasma.  Subjective: Patient was complaining of feeling weak when seen today.  Denies any more nausea or vomiting.  No more hematemesis.  Continues to have mild shortness of breath.  Assessment & Plan:   Principal Problem:   GI bleed Active Problems:   Hyponatremia    Decompensated liver disease (HCC)   SBP (spontaneous bacterial peritonitis) (HCC)   Hematemesis with nausea   Secondary esophageal varices with bleeding (HCC)   Portal hypertensive gastropathy (HCC)   Protein-calorie malnutrition, severe  Variceal bleed s/p EGD and EVL x5 on 02/22/20.  No more obvious bleed but hemoglobin dropped to 6.9 with platelets of 47 this morning. Patient received octreotide for 72 hours.  Per GI if patient has another evidence of acute bleeding he will need TIPS procedure, will not be a candidate for repeat EGD due to recent banding. -Give him 1 more unit of PRBC. -1 unit of platelets. -Continue to monitor CBC. -Continue Protonix twice daily.  Decompensated liver cirrhosis secondary to alcoholism coagulopathy and thrombocytopenia/ Recurrent ascites/ Portal HTN. Patient has a meld score of 30 points with an estimated 52.6% 79-month mortality.  Improved to 23 on re calculation few days later. Patient had paracentesis done yesterday with removal of 4.8L. Labs for consistent with transudate and negative for SBP. -Continue with low-sodium diet. -Restart home dose of Lasix. -Continue with Aldactone at 100 mg daily. -Monitor liver function and INR. -1 unit of platelets were ordered today. -Pt will follow up with Dr. Maximino Greenland as outpatient, who will refer pt to [facility] for transplant consideration.  History of SBP.  Repeat paracentesis was negative for SBP recurrence. -Continue home dose of Cipro for prophylaxis.  Dyspnea secondary to right pleural effusion.  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 Repeat chest x-ray done today due to some complaints of worsening dyspnea which shows recurrence of pleural effusion. -Continue to monitor. -Adding  home dose of Lasix. -Might need a repeat paracentesis if continued to get worse.  Hyponatremia.   Secondary to liver cirrhosis. -Continue to monitor.  Previous history of alcoholism Patient states that he has not had any alcohol in the last 1 year and hopes to be on transplant list.  Objective: Vitals:   02/29/20 1158 02/29/20 1240 02/29/20 1306 02/29/20 1357  BP: 109/73 114/75 112/70 119/77  Pulse: 92 92 94 98  Resp: (!) 22 (!) 22 18 (!) 26  Temp: 97.6 F (36.4 C) 98.6 F (37 C) 98.5 F (36.9 C) 98.8 F (37.1 C)  TempSrc:  Oral Oral Oral  SpO2: 97% 98% 100% 99%  Weight:      Height:        Intake/Output Summary (Last 24 hours) at 02/29/2020 1431 Last data filed at 02/29/2020 1357 Gross per 24 hour  Intake 1190 ml  Output 800 ml  Net 390 ml   Filed Weights   02/22/20 1452 02/24/20 0443 02/24/20 1929  Weight: 72.1 kg 75.9 kg 79 kg    Examination:  General exam: Appears calm and comfortable  Respiratory system: Clear to auscultation.  Decreased breath sound at right base, respiratory effort normal. Cardiovascular system: S1 & S2 heard, RRR.  Gastrointestinal system: Soft, nontender, mildly distended with fluid wave, bowel sounds positive. Central nervous system: Alert and oriented. No focal neurological deficits.Symmetric 5 x 5 power. Extremities: No edema, no cyanosis, pulses intact and symmetrical. Psychiatry: Judgement and insight appear normal. Mood & affect appropriate.    DVT prophylaxis: SCDs.  Patient has thrombocytopenia Code Status: Full  Family Communication: Discussed with patient Disposition Plan:  Status is: Inpatient  Remains inpatient appropriate because:Inpatient level of care appropriate due to severity of illness   Dispo: The patient is from: Home              Anticipated d/c is to: Home              Anticipated d/c date is: 2 days              Patient currently is not medically stable to d/c.   Consultants:   GI  Procedures:  Antimicrobials:  Ciprofloxacin  Data Reviewed: I have personally reviewed following labs and imaging  studies  CBC: Recent Labs  Lab 02/26/20 0438 02/27/20 0410 02/27/20 1341 02/28/20 0409 02/29/20 0523  WBC 13.5* 7.2 6.1 5.9 5.9  HGB 7.6* 6.6* 7.2* 7.4* 6.9*  HCT 22.3* 19.4* 21.0* 21.6* 20.7*  MCV 106.2* 106.6* 103.4* 104.9* 105.1*  PLT 99* 67* 60* 56* 47*   Basic Metabolic Panel: Recent Labs  Lab 02/25/20 0404 02/26/20 0438 02/27/20 0410 02/28/20 0409 02/29/20 0523  NA 122* 120* 127* 131* 132*  K 4.9 4.6 4.7 4.3 4.7  CL 94* 92* 96* 99 101  CO2 23 20* GLUCOSE 93 99 94 127* 85  BUN 48* 63* 51* 32* 19  CREATININE 1.36* 1.58* 1.11 0.87 0.48*  CALCIUM 7.6* 7.7* 8.4* 8.7* 8.7*  MG 2.1 2.2 2.3 2.3 2.2   GFR: Estimated Creatinine Clearance: 127.1 mL/min (A) (by C-G formula based on SCr of 0.48 mg/dL (L)). Liver Function Tests: Recent Labs  Lab 02/28/20 0406  AST 42*  ALT 26  ALKPHOS 84  BILITOT 11.2*  PROT 6.5  ALBUMIN 4.5   No results for input(s): LIPASE, AMYLASE in the last 168 hours. No results for input(s): AMMONIA in the last 168 hours. Coagulation Profile: No results for input(s): INR, PROTIME  in the last 168 hours. Cardiac Enzymes: No results for input(s): CKTOTAL, CKMB, CKMBINDEX, TROPONINI in the last 168 hours. BNP (last 3 results) No results for input(s): PROBNP in the last 8760 hours. HbA1C: No results for input(s): HGBA1C in the last 72 hours. CBG: No results for input(s): GLUCAP in the last 168 hours. Lipid Profile: No results for input(s): CHOL, HDL, LDLCALC, TRIG, CHOLHDL, LDLDIRECT in the last 72 hours. Thyroid Function Tests: No results for input(s): TSH, T4TOTAL, FREET4, T3FREE, THYROIDAB in the last 72 hours. Anemia Panel: No results for input(s): VITAMINB12, FOLATE, FERRITIN, TIBC, IRON, RETICCTPCT in the last 72 hours. Sepsis Labs: Recent Labs  Lab 02/25/20 0404  PROCALCITON 0.15    Recent Results (from the past 240 hour(s))  Respiratory Panel by RT PCR (Flu A&B, Covid) - Nasopharyngeal Swab     Status: None    Collection Time: 02/19/20  6:04 PM   Specimen: Nasopharyngeal Swab  Result Value Ref Range Status   SARS Coronavirus 2 by RT PCR NEGATIVE NEGATIVE Final    Comment: (NOTE) SARS-CoV-2 target nucleic acids are NOT DETECTED.  The SARS-CoV-2 RNA is generally detectable in upper respiratoy specimens during the acute phase of infection. The lowest concentration of SARS-CoV-2 viral copies this assay can detect is 131 copies/mL. A negative result does not preclude SARS-Cov-2 infection and should not be used as the sole basis for treatment or other patient management decisions. A negative result may occur with  improper specimen collection/handling, submission of specimen other than nasopharyngeal swab, presence of viral mutation(s) within the areas targeted by this assay, and inadequate number of viral copies (<131 copies/mL). A negative result must be combined with clinical observations, patient history, and epidemiological information. The expected result is Negative.  Fact Sheet for Patients:  https://www.moore.com/  Fact Sheet for Healthcare Providers:  https://www.young.biz/  This test is no t yet approved or cleared by the [place] FDA and  has been authorized for detection and/or diagnosis of SARS-CoV-2 by FDA under an Emergency Use Authorization (EUA). This EUA will remain  in effect (meaning this test can be used) for the duration of the COVID-19 declaration under Section 564(b)(1) of the Act, 21 U.S.C. section 360bbb-3(b)(1), unless the authorization is terminated or revoked sooner.     Influenza A by PCR NEGATIVE NEGATIVE Final   Influenza B by PCR NEGATIVE NEGATIVE Final    Comment: (NOTE) The Xpert Xpress SARS-CoV-2/FLU/RSV assay is intended as an aid in  the diagnosis of influenza from Nasopharyngeal swab specimens and  should not be used as a sole basis for treatment. Nasal washings and  aspirates are unacceptable for Xpert  Xpress SARS-CoV-2/FLU/RSV  testing.  Fact Sheet for Patients: https://www.moore.com/  Fact Sheet for Healthcare Providers: https://www.young.biz/  This test is not yet approved or cleared by the [place] FDA and  has been authorized for detection and/or diagnosis of SARS-CoV-2 by  FDA under an Emergency Use Authorization (EUA). This EUA will remain  in effect (meaning this test can be used) for the duration of the  Covid-19 declaration under Section 564(b)(1) of the Act, 21  U.S.C. section 360bbb-3(b)(1), unless the authorization is  terminated or revoked. Performed at [facility], [address] [place], [place] [address]   Body fluid culture     Status: None   Collection Time: 02/20/20 11:11 AM   Specimen: PATH Cytology Peritoneal fluid  Result Value Ref Range Status   Specimen Description   Final    PERITONEAL  Performed at [facility], [address] [place], [place] [address]    Special Requests   Final    NONE Performed at [facility], [address] [place], [place] [address]    Gram Stain NO WBC SEEN NO ORGANISMS SEEN CYTOSPIN SMEAR   Final   Culture   Final    NO GROWTH 3 DAYS Performed at [facility], [address] [place], [place] [address]    Report Status 02/23/2020 FINAL  Final  Culture, Urine     Status: Abnormal   Collection Time: 02/21/20  3:55 PM   Specimen: Urine, Clean Catch  Result Value Ref Range Status   Specimen Description   Final    URINE, CLEAN CATCH Performed at [facility], [address] [place], [place] [address]    Special Requests   Final    NONE Performed at [facility], [address] [place], [place] [address]    Culture MULTIPLE SPECIES PRESENT, SUGGEST RECOLLECTION (A)  Final   Report Status 02/22/2020 FINAL  Final  Body fluid culture     Status: None   Collection Time: 02/22/20   6:20 AM   Specimen: Peritoneal Washings; Body Fluid  Result Value Ref Range Status   Specimen Description   Final    PERITONEAL Performed at [facility], [address] [place], [place] [address]    Special Requests   Final    NONE Performed at [facility], [address] [place], [place] [address]    Gram Stain   Final    WBC PRESENT, PREDOMINANTLY MONONUCLEAR NO ORGANISMS SEEN CYTOSPIN SMEAR    Culture   Final    NO GROWTH 3 DAYS Performed at [facility], [address] [place], [place] [address]    Report Status 02/25/2020 FINAL  Final  Respiratory Panel by RT PCR (Flu A&B, Covid) - Nasopharyngeal Swab     Status: None   Collection Time: 02/22/20  6:37 AM   Specimen: Nasopharyngeal Swab  Result Value Ref Range Status   SARS Coronavirus 2 by RT PCR NEGATIVE NEGATIVE Final    Comment: (NOTE) SARS-CoV-2 target nucleic acids are NOT DETECTED.  The SARS-CoV-2 RNA is generally detectable in upper respiratoy specimens during the acute phase of infection. The lowest concentration of SARS-CoV-2 viral copies this assay can detect is 131 copies/mL. A negative result does not preclude SARS-Cov-2 infection and should not be used as the sole basis for treatment or other patient management decisions. A negative result may occur with  improper specimen collection/handling, submission of specimen other than nasopharyngeal swab, presence of viral mutation(s) within the areas targeted by this assay, and inadequate number of viral copies (<131 copies/mL). A negative result must be combined with clinical observations, patient history, and epidemiological information. The expected result is Negative.  Fact Sheet for Patients:  https://www.moore.com/https://www.fda.gov/media/142436/download  Fact Sheet for Healthcare Providers:  https://www.young.biz/https://www.fda.gov/media/142435/download  This test is no t yet approved or cleared by the [place] FDA and  has been authorized for detection  and/or diagnosis of SARS-CoV-2 by FDA under an Emergency Use Authorization (EUA). This EUA will remain  in effect (meaning this test can be used) for the duration of the COVID-19 declaration under Section 564(b)(1) of the Act, 21 U.S.C. section 360bbb-3(b)(1), unless the authorization is terminated or revoked sooner.     Influenza A by PCR NEGATIVE NEGATIVE Final   Influenza B by PCR NEGATIVE  NEGATIVE Final    Comment: (NOTE) The Xpert Xpress SARS-CoV-2/FLU/RSV assay is intended as an aid in  the diagnosis of influenza from Nasopharyngeal swab specimens and  should not be used as a sole basis for treatment. Nasal washings and  aspirates are unacceptable for Xpert Xpress SARS-CoV-2/FLU/RSV  testing.  Fact Sheet for Patients: https://www.moore.com/  Fact Sheet for Healthcare Providers: https://www.young.biz/  This test is not yet approved or cleared by the [place] FDA and  has been authorized for detection and/or diagnosis of SARS-CoV-2 by  FDA under an Emergency Use Authorization (EUA). This EUA will remain  in effect (meaning this test can be used) for the duration of the  Covid-19 declaration under Section 564(b)(1) of the Act, 21  U.S.C. section 360bbb-3(b)(1), unless the authorization is  terminated or revoked. Performed at [facility], [address] [place], [place] [address]   Body fluid culture     Status: None   Collection Time: 02/26/20  9:20 AM   Specimen: PATH Cytology Pleural fluid  Result Value Ref Range Status   Specimen Description   Final    PLEURAL Performed at [facility], [address], [place], [place] [address]    Special Requests   Final    PLEURAL Performed at [facility], [address] [place], [place] [address]    Gram Stain NO WBC SEEN NO ORGANISMS SEEN CYTOSPIN SMEAR   Final   Culture   Final    NO GROWTH Performed at [facility], [address],  [place], [place] [address]    Report Status 02/29/2020 FINAL  Final     Radiology Studies: US Paracentesis  Result Date: 02/28/2020 INDICATION: History of cirrhosis with recurrent symptomatic ascites. Please perform ultrasound-guided paracentesis for therapeutic purposes. EXAM: ULTRASOUND-GUIDED PARACENTESIS COMPARISON:  Multiple previous ultrasound-guided paracenteses, most recently on 02/20/2020 yielding 3.6 L of ascitic fluid. MEDICATIONS: None. COMPLICATIONS: None immediate. TECHNIQUE: Informed written consent was obtained from the patient after a discussion of the risks, benefits and alternatives to treatment. A timeout was performed prior to the initiation of the procedure. Initial ultrasound scanning demonstrates a large amount of ascites within the right lower abdomen which was subsequently prepped and draped in the usual sterile fashion. 1% lidocaine with epinephrine was used for local anesthesia. An ultrasound image was saved for documentation purposed. An 8 Fr Safe-T-Centesis catheter was introduced. The paracentesis was performed. The catheter was removed and a dressing was applied. The patient tolerated the procedure well without immediate post procedural complication. FINDINGS: A total of approximately 4.8 liters of serous fluid was removed. IMPRESSION: Successful ultrasound-guided paracentesis yielding 4.8 liters of peritoneal fluid. Electronically Signed   By: Simonne Come M.D.   On: 02/28/2020 12:24   DG Chest Port 1 View  Result Date: 02/29/2020 CLINICAL DATA:  Right pleural effusion EXAM: PORTABLE CHEST 1 VIEW COMPARISON:  02/27/2020 FINDINGS: Significant elevation of the right hemidiaphragm increased right pleural effusion and right lung atelectasis. Right perihilar opacity is partially obscured. Stable appearance of left lung. No pneumothorax. IMPRESSION: increased right pleural effusion and right lung atelectasis. Right perihilar opacity is partially obscured. Electronically Signed   By:  Guadlupe Spanish M.D.   On: 02/29/2020 09:08    Scheduled Meds: . sodium chloride   Intravenous Once  . chlorhexidine  15 mL Mouth Rinse BID  . ciprofloxacin  500 mg Oral Daily  . feeding supplement  237 mL Oral TID BM  . guaiFENesin  600 mg Oral  BID  . lactulose  10 g Oral TID  . mouth rinse  15 mL Mouth Rinse q12n4p  . melatonin  5 mg Oral QHS  . multivitamin with minerals  1 tablet Oral Daily  . pantoprazole  40 mg Oral BID  . sodium chloride flush  3 mL Intravenous Q12H  . spironolactone  100 mg Oral Daily   Continuous Infusions: . sodium chloride    . octreotide  (SANDOSTATIN)    IV infusion 50 mcg/hr (02/29/20 0215)     LOS: 7 days   Time spent: 35 minutes.  Arnetha Courser, MD [facility]  If 7PM-7AM, please contact night-coverage [email or web address]  02/29/2020, 2:31 PM   This record has been created using Conservation officer, historic buildings. Errors have been sought and corrected,but may not always be located. Such creation errors do not reflect on the standard of care.

## 2020-02-29 NOTE — Progress Notes (Signed)
   02/29/20 0836  Assess: MEWS Score  Temp 98.8 F (37.1 C)  BP 123/77  Pulse Rate 97  Resp (!) 26  SpO2 97 %  O2 Device Room Air  Assess: MEWS Score  MEWS Temp 0  MEWS Systolic 0  MEWS Pulse 0  MEWS RR 2  MEWS LOC 0  MEWS Score 2  MEWS Score Color Yellow  Assess: if the MEWS score is Yellow or Red  Were vital signs taken at a resting state? Yes  Focused Assessment Change from prior assessment (see assessment flowsheet)  Early Detection of Sepsis Score *See Row Information* Low  MEWS guidelines implemented *See Row Information* Yes  Treat  MEWS Interventions Escalated (See documentation below);Administered scheduled meds/treatments (Dr. Nelson Chimes- order for PRBCs)  Pain Scale 0-10  Pain Score 0  Take Vital Signs  Increase Vital Sign Frequency  Yellow: Q 2hr X 2 then Q 4hr X 2, if remains yellow, continue Q 4hrs  Escalate  MEWS: Escalate Yellow: discuss with charge nurse/RN and consider discussing with provider and RRT  Notify: Charge Nurse/RN  Name of Charge Nurse/RN Notified Susy Manor, RN  Date Charge Nurse/RN Notified 02/29/20  Time Charge Nurse/RN Notified 0900  Notify: Provider  Provider Name/Title Arnetha Courser, MD  Date Provider Notified 02/29/20  Time Provider Notified [phone]  Notification Type Page  Notification Reason Change in status  Response See new orders  Date of Provider Response 02/29/20  Time of Provider Response 0847   On assessment noted diminished breath sounds right lower lobe.  Order received for 1 unit PRBCs and platelets, as well as stat CXR.

## 2020-02-29 NOTE — Progress Notes (Signed)
Bruce Bell , MD [address], [address], [place], [place], [address] 3940 [address], [address], [place], [place], [address] Phone: [phone]  Fax: [phone]   Bruce Bell is being followed for hepatic hydrothorax, decompensated liver cirrhosis, upper GI bleed secondary to bleeding esophageal varices  Subjective: Had a bowel movement which is green today. No difficulty breathing no abdominal pain no other complaints.   Objective: Vital signs in last 24 hours: Vitals:   02/28/20 2026 02/29/20 0020 02/29/20 0555 02/29/20 0836  BP: (!) 106/46 (!) 116/49 116/65 123/77  Pulse: (!) 105 (!) 106 98 97  Resp: (!) 26  Temp: 98.2 F (36.8 C) 98.2 F (36.8 C) 98.3 F (36.8 C) 98.8 F (37.1 C)  TempSrc: Oral Oral Oral Oral  SpO2: 98% 96% 97% 97%  Weight:      Height:       Weight change:   Intake/Output Summary (Last 24 hours) at 02/29/2020 0840 Last data filed at 02/29/2020 0215 Gross per 24 hour  Intake 480 ml  Output 1300 ml  Net -820 ml     Exam: Heart:: Regular rate and rhythm Lungs: Air entry bilaterally equal and decreased more on the right side Abdomen: soft, nontender, normal bowel sounds   Lab Results: @ Micro Results: Recent Results (from the past 240 hour(s))  Respiratory Panel by RT PCR (Flu A&B, Covid) - Nasopharyngeal Swab     Status: None   Collection Time: 02/19/20  6:04 PM   Specimen: Nasopharyngeal Swab  Result Value Ref Range Status   SARS Coronavirus 2 by RT PCR NEGATIVE NEGATIVE Final    Comment: (NOTE) SARS-CoV-2 target nucleic acids are NOT DETECTED.  The SARS-CoV-2 RNA is generally detectable in upper respiratoy specimens during the acute phase of infection. The lowest concentration of SARS-CoV-2 viral copies this assay can detect is 131 copies/mL. A negative result does not preclude SARS-Cov-2 infection and should not be used as the sole basis for treatment or other patient management decisions. A negative result  may occur with  improper specimen collection/handling, submission of specimen other than nasopharyngeal swab, presence of viral mutation(s) within the areas targeted by this assay, and inadequate number of viral copies (<131 copies/mL). A negative result must be combined with clinical observations, patient history, and epidemiological information. The expected result is Negative.  Fact Sheet for Patients:  https://www.moore.com/  Fact Sheet for Healthcare Providers:  https://www.young.biz/  This test is no t yet approved or cleared by the [place] FDA and  has been authorized for detection and/or diagnosis of SARS-CoV-2 by FDA under an Emergency Use Authorization (EUA). This EUA will remain  in effect (meaning this test can be used) for the duration of the COVID-19 declaration under Section 564(b)(1) of the Act, 21 U.S.C. section 360bbb-3(b)(1), unless the authorization is terminated or revoked sooner.     Influenza A by PCR NEGATIVE NEGATIVE Final   Influenza B by PCR NEGATIVE NEGATIVE Final    Comment: (NOTE) The Xpert Xpress SARS-CoV-2/FLU/RSV assay is intended as an aid in  the diagnosis of influenza from Nasopharyngeal swab specimens and  should not be used as a sole basis for treatment. Nasal washings and  aspirates are unacceptable for Xpert Xpress SARS-CoV-2/FLU/RSV  testing.  Fact Sheet for Patients: https://www.moore.com/  Fact Sheet for Healthcare Providers: https://www.young.biz/  This test is not yet approved or cleared by the [place] FDA and  has been authorized for detection and/or diagnosis of SARS-CoV-2 by  FDA under  an Emergency Use Authorization (EUA). This EUA will remain  in effect (meaning this test can be used) for the duration of the  Covid-19 declaration under Section 564(b)(1) of the Act, 21  U.S.C. section 360bbb-3(b)(1), unless the authorization is  terminated  or revoked. Performed at [facility], [address] [place], [place] [address]   Body fluid culture     Status: None   Collection Time: 02/20/20 11:11 AM   Specimen: PATH Cytology Peritoneal fluid  Result Value Ref Range Status   Specimen Description   Final    PERITONEAL Performed at [facility], [address] [place], [place] [address]    Special Requests   Final    NONE Performed at [facility], [address] [place], [place] [address]    Gram Stain NO WBC SEEN NO ORGANISMS SEEN CYTOSPIN SMEAR   Final   Culture   Final    NO GROWTH 3 DAYS Performed at [facility], [address] [place], [place] [address]    Report Status 02/23/2020 FINAL  Final  Culture, Urine     Status: Abnormal   Collection Time: 02/21/20  3:55 PM   Specimen: Urine, Clean Catch  Result Value Ref Range Status   Specimen Description   Final    URINE, CLEAN CATCH Performed at [facility], [address], [place], [place] [address]    Special Requests   Final    NONE Performed at [facility], [address], [place], [place] [address]    Culture MULTIPLE SPECIES PRESENT, SUGGEST RECOLLECTION (A)  Final   Report Status 02/22/2020 FINAL  Final  Body fluid culture     Status: None   Collection Time: 02/22/20  6:20 AM   Specimen: Peritoneal Washings; Body Fluid  Result Value Ref Range Status   Specimen Description   Final    PERITONEAL Performed at [facility], [address] [place], [place] [address]    Special Requests   Final    NONE Performed at [facility], [address] [place], [place] [address]    Gram Stain   Final    WBC PRESENT, PREDOMINANTLY MONONUCLEAR NO ORGANISMS SEEN CYTOSPIN SMEAR    Culture   Final    NO GROWTH 3 DAYS Performed at [facility], [address] [place], [place] [address]    Report Status 02/25/2020 FINAL  Final    Respiratory Panel by RT PCR (Flu A&B, Covid) - Nasopharyngeal Swab     Status: None   Collection Time: 02/22/20  6:37 AM   Specimen: Nasopharyngeal Swab  Result Value Ref Range Status   SARS Coronavirus 2 by RT PCR NEGATIVE NEGATIVE Final    Comment: (NOTE) SARS-CoV-2 target nucleic acids are NOT DETECTED.  The SARS-CoV-2 RNA is generally detectable in upper respiratoy specimens during the acute phase of infection. The lowest concentration of SARS-CoV-2 viral copies this assay can detect is 131 copies/mL. A negative result does not preclude SARS-Cov-2 infection and should not be used as the sole basis for treatment or other patient management decisions. A negative result may occur with  improper specimen collection/handling, submission of specimen other than nasopharyngeal swab, presence of viral mutation(s) within the areas targeted by this assay, and inadequate number of viral copies (<131 copies/mL). A negative result must be combined with clinical observations, patient history, and epidemiological information. The expected result is Negative.  Fact Sheet  for Patients:  https://www.moore.com/  Fact Sheet for Healthcare Providers:  https://www.young.biz/  This test is no t yet approved or cleared by the [place] FDA and  has been authorized for detection and/or diagnosis of SARS-CoV-2 by FDA under an Emergency Use Authorization (EUA). This EUA will remain  in effect (meaning this test can be used) for the duration of the COVID-19 declaration under Section 564(b)(1) of the Act, 21 U.S.C. section 360bbb-3(b)(1), unless the authorization is terminated or revoked sooner.     Influenza A by PCR NEGATIVE NEGATIVE Final   Influenza B by PCR NEGATIVE NEGATIVE Final    Comment: (NOTE) The Xpert Xpress SARS-CoV-2/FLU/RSV assay is intended as an aid in  the diagnosis of influenza from Nasopharyngeal swab specimens and  should not be used as  a sole basis for treatment. Nasal washings and  aspirates are unacceptable for Xpert Xpress SARS-CoV-2/FLU/RSV  testing.  Fact Sheet for Patients: https://www.moore.com/  Fact Sheet for Healthcare Providers: https://www.young.biz/  This test is not yet approved or cleared by the [place] FDA and  has been authorized for detection and/or diagnosis of SARS-CoV-2 by  FDA under an Emergency Use Authorization (EUA). This EUA will remain  in effect (meaning this test can be used) for the duration of the  Covid-19 declaration under Section 564(b)(1) of the Act, 21  U.S.C. section 360bbb-3(b)(1), unless the authorization is  terminated or revoked. Performed at [facility], [address] [place], [place] [address]   Body fluid culture     Status: None (Preliminary result)   Collection Time: 02/26/20  9:20 AM   Specimen: PATH Cytology Pleural fluid  Result Value Ref Range Status   Specimen Description   Final    PLEURAL Performed at [facility], [address], [place], [place] [address]    Special Requests   Final    PLEURAL Performed at [facility], [address] [place], [place] [address]    Gram Stain NO WBC SEEN NO ORGANISMS SEEN CYTOSPIN SMEAR   Final   Culture   Final    NO GROWTH 2 DAYS Performed at [facility], [address], [place], [place] [address]    Report Status PENDING  Incomplete   Studies/Results: DG Chest 2 View  Result Date: 02/27/2020 CLINICAL DATA:  RIGHT chest wall pain since thoracentesis performed yesterday EXAM: CHEST - 2 VIEW COMPARISON:  02/26/2020 FINDINGS: Enlargement of cardiac silhouette. Mediastinal contours normal. Increased RIGHT pleural effusion and lower lung atelectasis since prior study. RIGHT perihilar opacity seen on previous exam obscured by atelectasis. No pneumothorax. LEFT lung clear. IMPRESSION: Recurrent RIGHT pleural effusion and basilar  atelectasis. Electronically Signed   By: Ulyses Southward M.D.   On: 02/27/2020 09:32   US Paracentesis  Result Date: 02/28/2020 INDICATION: History of cirrhosis with recurrent symptomatic ascites. Please perform ultrasound-guided paracentesis for therapeutic purposes. EXAM: ULTRASOUND-GUIDED PARACENTESIS COMPARISON:  Multiple previous ultrasound-guided paracenteses, most recently on 02/20/2020 yielding 3.6 L of ascitic fluid. MEDICATIONS: None. COMPLICATIONS: None immediate. TECHNIQUE: Informed written consent was obtained from the patient after a discussion of the risks, benefits and alternatives to treatment. A timeout was performed prior to the initiation of the procedure. Initial ultrasound scanning demonstrates a large amount of ascites within the right lower abdomen which was subsequently prepped and draped in the usual sterile fashion. 1% lidocaine with epinephrine was used for local anesthesia. An ultrasound image was saved for documentation purposed. An 8 Fr Safe-T-Centesis catheter was introduced. The  paracentesis was performed. The catheter was removed and a dressing was applied. The patient tolerated the procedure well without immediate post procedural complication. FINDINGS: A total of approximately 4.8 liters of serous fluid was removed. IMPRESSION: Successful ultrasound-guided paracentesis yielding 4.8 liters of peritoneal fluid. Electronically Signed   By: Simonne ComeJohn  Watts M.D.   On: 02/28/2020 12:24   Medications: I have reviewed the patient's current medications. Scheduled Meds: . sodium chloride   Intravenous Once  . chlorhexidine  15 mL Mouth Rinse BID  . ciprofloxacin  500 mg Oral Daily  . feeding supplement  237 mL Oral TID BM  . guaiFENesin  600 mg Oral BID  . lactulose  10 g Oral TID  . mouth rinse  15 mL Mouth Rinse q12n4p  . melatonin  5 mg Oral QHS  . multivitamin with minerals  1 tablet Oral Daily  . pantoprazole  40 mg Oral BID  . sodium chloride flush  3 mL Intravenous Q12H    . spironolactone  100 mg Oral Daily   Continuous Infusions: . sodium chloride    . octreotide  (SANDOSTATIN)    IV infusion 50 mcg/hr (02/29/20 0215)   PRN Meds:.sodium chloride, chlorpheniramine-HYDROcodone, hydrocortisone cream, hydrOXYzine, ondansetron **OR** ondansetron (ZOFRAN) IV, sodium chloride flush, traMADol   Assessment: Principal Problem:   GI bleed Active Problems:   Hyponatremia   Decompensated liver disease (HCC)   SBP (spontaneous bacterial peritonitis) (HCC)   Hematemesis with nausea   Secondary esophageal varices with bleeding (HCC)   Portal hypertensive gastropathy (HCC)   Protein-calorie malnutrition, severe  Bruce HuaDavid [id number] y.o.malethe history with a history of alcoholic liver cirrhosis admitted on 02/22/2020. Admitted with hematemesis, acute liver failure negative for spontaneous bacterial peritonitis on paracentesis. Also has a pleural effusion consideration for hepatic hydrothorax. Had an EGD on 02/22/2020 that demonstrated 3 columns of grade 2 varices with stigmata of recent bleeding with red wale signs. 5 bands were placed. Mild portal hypertensive gastropathy. S/p  diagnostic thoracocentesis-transudate . Creatinine has normalized. Hb 6.9 this morning and has mirrored drop in creatinine likely due to hemodiluation.   Plan: 1. Limit dietary salt. 2. Pleural fluid is a transudate likely due to hepatic hydrothorax.If does not respond to medical management may need a TIPS procedure. 4. No overt signs of rebleeding  If there is any evidence of acute bleeding then will require TIPS procedure as it would not be possible to do a repeat endoscopy as she had 5 bands placed just a few days back.  If stable and risingRepeat EGD in 2 to 3 weeks with Dr. Maximino Greenlandahiliani in view of recent banding of varices.  5. continue aldactone 100 mg a day , hold lasix .  6. MELD-Na score: 30 at 02/24/2020  5:53 AM MELD score: 23 at 02/24/2020  5:53 AM Calculated from: Serum  Creatinine: 1.07 mg/dL at 54/09/811910/16/2021  1:475:53 AM Serum Sodium: 121 mmol/L (Using min of 125 mmol/L) at 02/24/2020  5:53 AM Total Bilirubin: 12.3 mg/dL at 82/95/621310/14/2021  0:866:04 AM INR(ratio): 1.7 at 02/22/2020  2:05 PM Age: 62 years   7. High risk associated with TIPs at his MELD score  8. If hemoglobin is stable tomorrow and no further signs of bleeding [green stools today] not short of breath and able to walk without any dyspnea and could consider sending him home. He will need very close follow-up as an outpatient early next week to be seen by Dr. Maximino Greenlandahiliani. If creatinine is stable tomorrow we can start him on Lasix 20  mg.    LOS: 7 days   Bruce Mood, MD 02/29/2020, 8:40 AM

## 2020-02-29 NOTE — Progress Notes (Signed)
Nutrition Follow Up Note   DOCUMENTATION CODES:   Severe malnutrition in context of chronic illness  INTERVENTION:   Ensure Enlive po TID, each supplement provides 350 kcal and 20 grams of protein  MVI daily   2 gram sodium diet with advancement   NUTRITION DIAGNOSIS:   Severe Malnutrition related to chronic illness (cirrhosis) as evidenced by severe fat depletion, severe muscle depletion, 12 percent weight loss in 4 months.  GOAL:   Patient will meet greater than or equal to 90% of their needs - progressing   MONITOR:   PO intake, Supplement acceptance, Labs, Weight trends, Skin, I & O's  ASSESSMENT:   34 y.o. male with history of cirrhosis of the liver, alcohol abuse in remission for 12 months, SBP who presents with abominal pain and GIB   Pt s/p EGD 10/14: found to have varices that were banded  Pt s/p paracentesis 10/20 with 4.8L output   Pt with good appetite and oral intake in hospital; pt eating 50-100% of meals and drinking some of his Ensure supplements. Recommend continue supplements and MVI after discharge. Pt has received nutrition education and is very knowledgeable about what he needs to be eating. Per chart, pt is up ~20lbs since admit.    Medications reviewed and include: ciprofloxacin, MVI, melatonin, lactulose, protonix, aldactone, octreotide   Labs reviewed: Na 132(L), creat 0.48(L), Mg 2.2 wnl Hgb 6.9(L), Hct 20.7(L), MCV 105.1(H), MCH 35.0(H)  Diet Order:   Diet Order            Diet 2 gram sodium Room service appropriate? Yes; Fluid consistency: Thin  Diet effective now                EDUCATION NEEDS:   Education needs have been addressed  Skin:  Skin Assessment: Reviewed RN Assessment  Last BM:  10/20- type 4  Height:   Ht Readings from Last 1 Encounters:  02/22/20 5\' 8"  (1.727 m)    Weight:   Wt Readings from Last 1 Encounters:  02/24/20 79 kg    Ideal Body Weight:  70 kg  BMI:  Body mass index is 26.47  kg/m.  Estimated Nutritional Needs:   Kcal:  2200-2500kcal/day  Protein:  115-125g/day  Fluid:  1.8L/day  02/26/20 MS, RD, LDN Please refer to [facility] for RD and/or RD on-call/weekend/after hours pager

## 2020-03-01 ENCOUNTER — Encounter: Payer: Self-pay | Admitting: Internal Medicine

## 2020-03-01 ENCOUNTER — Inpatient Hospital Stay (HOSPITAL_COMMUNITY)
Admission: AD | Admit: 2020-03-01 | Discharge: 2020-03-05 | DRG: 432 | Disposition: A | Discharge status: Home / Self Care | Payer: Managed Care, Other (non HMO) | Source: Other Acute Inpatient Hospital | Attending: Internal Medicine | Admitting: Internal Medicine

## 2020-03-01 ENCOUNTER — Inpatient Hospital Stay: Payer: Commercial Managed Care - HMO

## 2020-03-01 DIAGNOSIS — K7030 Alcoholic cirrhosis of liver without ascites: Secondary | ICD-10-CM | POA: Diagnosis present

## 2020-03-01 DIAGNOSIS — I8511 Secondary esophageal varices with bleeding: Secondary | ICD-10-CM | POA: Diagnosis present

## 2020-03-01 DIAGNOSIS — E871 Hypo-osmolality and hyponatremia: Secondary | ICD-10-CM | POA: Diagnosis present

## 2020-03-01 DIAGNOSIS — D6959 Other secondary thrombocytopenia: Secondary | ICD-10-CM | POA: Diagnosis present

## 2020-03-01 DIAGNOSIS — K920 Hematemesis: Secondary | ICD-10-CM | POA: Diagnosis present

## 2020-03-01 DIAGNOSIS — Z7682 Awaiting organ transplant status: Secondary | ICD-10-CM

## 2020-03-01 DIAGNOSIS — K7469 Other cirrhosis of liver: Secondary | ICD-10-CM

## 2020-03-01 DIAGNOSIS — E43 Unspecified severe protein-calorie malnutrition: Secondary | ICD-10-CM | POA: Diagnosis present

## 2020-03-01 DIAGNOSIS — K922 Gastrointestinal hemorrhage, unspecified: Secondary | ICD-10-CM | POA: Diagnosis present

## 2020-03-01 DIAGNOSIS — J918 Pleural effusion in other conditions classified elsewhere: Secondary | ICD-10-CM | POA: Diagnosis present

## 2020-03-01 DIAGNOSIS — R188 Other ascites: Secondary | ICD-10-CM

## 2020-03-01 DIAGNOSIS — K7031 Alcoholic cirrhosis of liver with ascites: Principal | ICD-10-CM | POA: Diagnosis present

## 2020-03-01 DIAGNOSIS — F1021 Alcohol dependence, in remission: Secondary | ICD-10-CM | POA: Diagnosis present

## 2020-03-01 DIAGNOSIS — Z6824 Body mass index (BMI) 24.0-24.9, adult: Secondary | ICD-10-CM

## 2020-03-01 DIAGNOSIS — Z9889 Other specified postprocedural states: Secondary | ICD-10-CM | POA: Diagnosis present

## 2020-03-01 DIAGNOSIS — Z79899 Other long term (current) drug therapy: Secondary | ICD-10-CM

## 2020-03-01 DIAGNOSIS — D689 Coagulation defect, unspecified: Secondary | ICD-10-CM | POA: Diagnosis present

## 2020-03-01 DIAGNOSIS — Z792 Long term (current) use of antibiotics: Secondary | ICD-10-CM

## 2020-03-01 DIAGNOSIS — K766 Portal hypertension: Secondary | ICD-10-CM | POA: Diagnosis present

## 2020-03-01 LAB — PREPARE RBC (CROSSMATCH)

## 2020-03-01 LAB — PREPARE PLATELET PHERESIS: Unit division: 0

## 2020-03-01 LAB — CBC
HCT: 22.6 % — ABNORMAL LOW (ref 39.0–52.0)
HCT: 24.8 % — ABNORMAL LOW (ref 39.0–52.0)
Hemoglobin: 7.5 g/dL — ABNORMAL LOW (ref 13.0–17.0)
Hemoglobin: 8.5 g/dL — ABNORMAL LOW (ref 13.0–17.0)
MCH: 34.1 pg — ABNORMAL HIGH (ref 26.0–34.0)
MCH: 34.1 pg — ABNORMAL HIGH (ref 26.0–34.0)
MCHC: 33.2 g/dL (ref 30.0–36.0)
MCHC: 34.3 g/dL (ref 30.0–36.0)
MCV: 102.7 fL — ABNORMAL HIGH (ref 80.0–100.0)
MCV: 99.6 fL (ref 80.0–100.0)
Platelets: 53 10*3/uL — ABNORMAL LOW (ref 150–400)
Platelets: 54 10*3/uL — ABNORMAL LOW (ref 150–400)
RBC: 2.2 MIL/uL — ABNORMAL LOW (ref 4.22–5.81)
RBC: 2.49 MIL/uL — ABNORMAL LOW (ref 4.22–5.81)
RDW: 19.3 % — ABNORMAL HIGH (ref 11.5–15.5)
RDW: 19.5 % — ABNORMAL HIGH (ref 11.5–15.5)
WBC: 5.9 10*3/uL (ref 4.0–10.5)
WBC: 5.8 10*3/uL (ref 4.0–10.5)
nRBC: 0 % (ref 0.0–0.2)
nRBC: 0 % (ref 0.0–0.2)

## 2020-03-01 LAB — COMPREHENSIVE METABOLIC PANEL
ALT: 20 U/L (ref 0–44)
AST: 34 U/L (ref 15–41)
Albumin: 4.6 g/dL (ref 3.5–5.0)
Alkaline Phosphatase: 90 U/L (ref 38–126)
Anion gap: 7 (ref 5–15)
BUN: 16 mg/dL (ref 6–20)
CO2: 22 mmol/L (ref 22–32)
Calcium: 8.7 mg/dL — ABNORMAL LOW (ref 8.9–10.3)
Chloride: 103 mmol/L (ref 98–111)
Creatinine, Ser: 0.53 mg/dL — ABNORMAL LOW (ref 0.61–1.24)
GFR, Estimated: >60 mL/min (ref >60)
Glucose, Bld: 100 mg/dL — ABNORMAL HIGH (ref 70–99)
Potassium: 4.8 mmol/L (ref 3.5–5.1)
Sodium: 132 mmol/L — ABNORMAL LOW (ref 135–145)
Total Bilirubin: 12.9 mg/dL — ABNORMAL HIGH (ref 0.3–1.2)
Total Protein: 6.2 g/dL — ABNORMAL LOW (ref 6.5–8.1)

## 2020-03-01 LAB — BPAM PLATELET PHERESIS
Blood Product Expiration Date: 202110242359
ISSUE DATE / TIME: 202110211412
Unit Type and Rh: 5100

## 2020-03-01 LAB — BPAM RBC: Unit Type and Rh: 5100

## 2020-03-01 LAB — PROTIME-INR
INR: 3.2 — ABNORMAL HIGH (ref 0.8–1.2)
Prothrombin Time: 31.6 seconds — ABNORMAL HIGH (ref 11.4–15.2)

## 2020-03-01 MED ORDER — ENSURE ENLIVE PO LIQD
237.0000 mL | Freq: Three times a day (TID) | ORAL | Status: DC
  Administered 2020-03-02 – 2020-03-05 (×7): 237 mL via ORAL

## 2020-03-01 MED ORDER — SODIUM CHLORIDE 0.9% IV SOLUTION: Freq: Once | INTRAVENOUS | Status: AC

## 2020-03-01 MED ORDER — ENSURE ENLIVE PO LIQD: 237.0000 mL | Freq: Three times a day (TID) | ORAL | 12 refills | Status: DC

## 2020-03-01 MED ORDER — SODIUM CHLORIDE 0.9 % IV SOLN: 50.0000 ug/h | INTRAVENOUS | 0 refills | Status: DC

## 2020-03-01 MED ORDER — SODIUM CHLORIDE 0.9 % IV SOLN
50.0000 ug/h | INTRAVENOUS | Status: DC
  Administered 2020-03-02 – 2020-03-04 (×7): 50 ug/h via INTRAVENOUS
  Filled 2020-03-01 (×11): qty 1

## 2020-03-01 MED ORDER — SPIRONOLACTONE 25 MG PO TABS
100.0000 mg | ORAL_TABLET | Freq: Every day | ORAL | Status: DC
  Administered 2020-03-02 – 2020-03-05 (×4): 100 mg via ORAL
  Filled 2020-03-01 (×4): qty 4

## 2020-03-01 MED ORDER — HYDROXYZINE HCL 25 MG PO TABS: 25.0000 mg | ORAL_TABLET | Freq: Three times a day (TID) | ORAL | Status: DC | PRN

## 2020-03-01 MED ORDER — CHLORHEXIDINE GLUCONATE 0.12 % MT SOLN
15.0000 mL | Freq: Two times a day (BID) | OROMUCOSAL | Status: DC
  Administered 2020-03-02 – 2020-03-05 (×8): 15 mL via OROMUCOSAL
  Filled 2020-03-01 (×8): qty 15

## 2020-03-01 MED ORDER — ADULT MULTIVITAMIN W/MINERALS CH
1.0000 | ORAL_TABLET | Freq: Every day | ORAL | Status: DC
  Administered 2020-03-02 – 2020-03-05 (×4): 1 via ORAL
  Filled 2020-03-01 (×5): qty 1

## 2020-03-01 MED ORDER — LACTULOSE 10 GM/15ML PO SOLN
10.0000 g | Freq: Three times a day (TID) | ORAL | Status: DC
  Administered 2020-03-02 – 2020-03-05 (×11): 10 g via ORAL
  Filled 2020-03-01 (×11): qty 15

## 2020-03-01 MED ORDER — SODIUM CHLORIDE 0.9% IV SOLUTION: Freq: Once | INTRAVENOUS | Status: DC

## 2020-03-01 MED ORDER — ADULT MULTIVITAMIN W/MINERALS CH: 1.0000 | ORAL_TABLET | Freq: Every day | ORAL | 0 refills | Status: AC

## 2020-03-01 MED ORDER — FUROSEMIDE 40 MG PO TABS
40.0000 mg | ORAL_TABLET | Freq: Every day | ORAL | Status: DC
  Administered 2020-03-02 – 2020-03-05 (×4): 40 mg via ORAL
  Filled 2020-03-01 (×4): qty 1

## 2020-03-01 MED ORDER — IOHEXOL 350 MG/ML SOLN
100.0000 mL | Freq: Once | INTRAVENOUS | Status: AC | PRN
  Administered 2020-03-01: 100 mL via INTRAVENOUS

## 2020-03-01 MED ORDER — CHLORHEXIDINE GLUCONATE 0.12 % MT SOLN: 15.0000 mL | Freq: Two times a day (BID) | OROMUCOSAL | 0 refills | Status: DC

## 2020-03-01 MED ORDER — PANTOPRAZOLE SODIUM 40 MG PO TBEC: 40.0000 mg | DELAYED_RELEASE_TABLET | Freq: Every day | ORAL | Status: DC

## 2020-03-01 MED ORDER — CIPROFLOXACIN HCL 500 MG PO TABS
500.0000 mg | ORAL_TABLET | Freq: Every day | ORAL | Status: DC
  Administered 2020-03-02 – 2020-03-05 (×4): 500 mg via ORAL
  Filled 2020-03-01 (×4): qty 1

## 2020-03-01 NOTE — Progress Notes (Signed)
Patient ID: Bruce Bell, male   DOB: June 29, 1985, 34 y.o.   MRN: [id number]   Patient presents today for follow-up guided thoracentesis as well as paracentesis.  Unfortunately, the patient's Na MELD score continues to worsen (currently 31, now driven by both elevated bilirubin and INR) and as such, TIPS intervention would only be pursued in the setting of a life-threatening upper GI bleed.  As such, I would recommend consideration for referral to a tertiary center who performs hepatic transplant ([facility] or [facility]) at is unlikely his NaMELD score will improve to the point where he will be a transplant candidate.  In the meantime, will proceed with obtaining a BRTO protocol CT as well as a cardiac echo in case a TIPS is emergently requested (note, in the [facility] system, TIPS are currently only performed at [facility]).  Katherina Right, MD Pager #: [phone]

## 2020-03-01 NOTE — Progress Notes (Signed)
PROGRESS NOTE    Bruce GowdaDavid Bell  YNW:[id number]:[id number] DOB: 03/30/1986 DOA: 02/22/2020 [facility]   Brief Narrative:  Bruce Bell a 34 y.o.malewith medical history significant foralcoholic liver cirrhosisas well ashistory of SBP on chronic antibiotic therapy with ciprofloxacinwho [place] to the areaand was discharged from [facility] about 24 hours prior to presenting to [facility]. [facility] for evaluation of decompensated liver cirrhosis withincreasing abdominal distention and shortness of breath. Patient underwent ultrasound-guided paracentesis while [facility] with drainage of about 3.6 L of fluid. Further drainage was not done due to hypotension. Patient had prior work[facility] done [facility] and was negative for hepatitis B and C with positive hepatitis A IgG, negative ASMA/AMA.Patient also noted to have a normal AFP level.  Alpha 1 antitrypsin and ceruloplasmin tested during recent hospitalization was within normal limit. Patient was discharged in stable condition and advised to follow-up with GI as an outpatient for EGD to screen for esophageal varices.  Presented to ED for evaluation of diffuse, sharp abdominal pain.  Which started overnight after admission.  Associated with couple of episodes of emesis of bright red blood. Admitted for variceal bleed, EGD was done and 5 bands were placed. Also evaluated for TIPS procedure and thought to be high risk due to high meld score.  GI is [facility] to get [facility] list. Received 3 unit of PRBC and 2 units of fresh frozen plasma and 1 unit of platelet so far.  Subjective: Overnight concern of 2 episodes of hematemesis.  He was ordered another unit of PRBC by the nighttime NP.  Patient was complaining of worsening shortness of breath and appears tachypneic when seen this morning.  Per  patient he had 2 episodes of when his mouth was filled with blood and there was some seepage of blood on the pillow from the corners of his mouth.  No nausea.  Not sure whether it was a vomitus.  Assessment & Plan:   Principal Problem:   GI bleed Active Problems:   Hyponatremia   Decompensated liver disease (HCC)   Decompensated hepatic cirrhosis (HCC)   SBP (spontaneous bacterial peritonitis) (HCC)   Hematemesis with nausea   Secondary esophageal varices with bleeding (HCC)   Portal hypertensive gastropathy (HCC)   Protein-calorie malnutrition, severe   Ascites   Status post thoracentesis  Variceal bleed s/p EGD and EVL x5 on 02/22/20.  There was some concern of hematemesis overnight.  Might be a mucosal bleed from gums as patient also has thrombocytopenia.  No nausea but he did spit out blood as his mouth was full while sleeping.  Hemoglobin remained stable and improved to 8.5 after receiving another unit overnight.  He was also restarted on octreotide. Patient is not a good candidate for TIPS procedure which can only be done in case of life-threatening GI bleed. -Received 1 more unit of PRBC overnight-total of 3 units -Continue to monitor CBC. -Continue Protonix twice daily. -Request was also placed for [facility]-no bed availability yet. Patient was seen by equal GI during a recent [facility].  Decompensated liver cirrhosis secondary to alcoholism coagulopathy and thrombocytopenia/ Recurrent ascites/ Portal HTN. Patient has a meld score of 31 points today.  Patient had repeat paracentesis today with removal of 3.8L.  For symptom relief.  No labs done as recent labs were negative for SBP. -Continue with  low-sodium diet. -Continue home dose of Lasix. -Continue with Aldactone at 100 mg daily. -Monitor liver function and INR. -Pt will follow up with Dr. Maximino Greenland as outpatient, who will refer pt to [facility] for transplant consideration.  History of  SBP.  Repeat paracentesis was negative for SBP recurrence. -Continue home dose of Cipro for prophylaxis.  Dyspnea secondary to right pleural effusion.  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 Thoracentesis was repeated again today with worsening dyspnea and removal of another 4.8 L.  Resulted in improvement of his symptoms. -Continue to monitor. -Continue home dose of Lasix.  Hyponatremia.  Secondary to liver cirrhosis. -Continue to monitor.  Previous history of alcoholism Patient states that he has not had any alcohol in the last 1 year and hopes to be on transplant list.  Objective: Vitals:   03/01/20 0309 03/01/20 0340 03/01/20 0359 03/01/20 0605  BP: 121/70 108/61 118/79 (!) 98/59  Pulse: 99 98 (!) 103 95  Resp: 19 20 20 20   Temp: 98.6 F (37 C) 98.9 F (37.2 C) 98.7 F (37.1 C) 98.7 F (37.1 C)  TempSrc: Oral Oral Oral Oral  SpO2:   100%   Weight:      Height:        Intake/Output Summary (Last 24 hours) at 03/01/2020 0818 Last data filed at 03/01/2020 03/03/2020 Gross per 24 hour  Intake 2263 ml  Output 400 ml  Net 1863 ml   Filed Weights   02/22/20 1452 02/24/20 0443 02/24/20 1929  Weight: 72.1 kg 75.9 kg 79 kg    Examination:  Bruce.  Well-developed gentleman, in no acute distress. Pulmonary.  Decreased breath sounds on right mid and lower zone, mildly increased work of breathing. CV.  Regular rate and rhythm, no JVD, rub or murmur. Abdomen.  Soft, nontender, mildly distended with positive fluid wave, BS positive. CNS.  Alert and oriented x3.  No focal neurologic deficit. Extremities.  No edema, no cyanosis, pulses intact and symmetrical. Psychiatry.  Judgment and insight appears normal.    DVT prophylaxis: SCDs.  Patient has thrombocytopenia Code Status: Full  Family Communication: Discussed with patient Disposition Plan:  Status  is: Inpatient  Remains inpatient appropriate because:Inpatient level of care appropriate due to severity of illness   Dispo: The patient is from: Home              Anticipated d/c is to: Home              Anticipated d/c date is: 2 days              Patient currently is not medically stable to d/c.  Request is being placed for transfer to [facility].  High risk for another life-threatening bleed.   Consultants:   GI  IR  Procedures:  Antimicrobials:  Ciprofloxacin  Data Reviewed: I have personally reviewed following labs and imaging studies  CBC: Recent Labs  Lab 02/27/20 1341 02/28/20 0409 02/29/20 0523 02/29/20 1644 03/01/20 0214  WBC 6.1 5.9 5.9 6.6 5.9  NEUTROABS  --   --   --  4.6  --   HGB 7.2* 7.4* 6.9* 7.6* 7.5*  HCT 21.0* 21.6* 20.7* 22.1* 22.6*  MCV 103.4* 104.9* 105.1* 101.4* 102.7*  PLT 60* 56* 47* 55* 53*   Basic Metabolic Panel: Recent Labs  Lab 02/25/20 0404 02/25/20 0404 02/26/20 02/28/20 02/27/20 0410 02/28/20 0409 02/29/20 03/02/20 03/01/20 0214  NA 122*   < > 120* 127* 131* 132* 132*  K 4.9   < > 4.6 4.7 4.3 4.7 4.8  CL 94*   < > 92* 96* 99 101 103  CO2 23   < > 20* GLUCOSE 93   < > 99 94 127* 85 100*  BUN 48*   < > 63* 51* 32* 19 16  CREATININE 1.36*   < > 1.58* 1.11 0.87 0.48* 0.53*  CALCIUM 7.6*   < > 7.7* 8.4* 8.7* 8.7* 8.7*  MG 2.1  --  2.2 2.3 2.3 2.2  --    < > = values in this interval not displayed.   GFR: Estimated Creatinine Clearance: 127.1 mL/min (A) (by C-G formula based on SCr of 0.53 mg/dL (L)). Liver Function Tests: Recent Labs  Lab 02/28/20 0406 03/01/20 0214  AST 42* 34  ALT 26 20  ALKPHOS 84 90  BILITOT 11.2* 12.9*  PROT 6.5 6.2*  ALBUMIN 4.5 4.6   No results for input(s): LIPASE, AMYLASE in the last 168 hours. No results for input(s): AMMONIA in the last 168 hours. Coagulation Profile: Recent Labs  Lab 03/01/20 0214  INR 3.2*   Cardiac Enzymes: No results for input(s): CKTOTAL, CKMB,  CKMBINDEX, TROPONINI in the last 168 hours. BNP (last 3 results) No results for input(s): PROBNP in the last 8760 hours. HbA1C: No results for input(s): HGBA1C in the last 72 hours. CBG: No results for input(s): GLUCAP in the last 168 hours. Lipid Profile: No results for input(s): CHOL, HDL, LDLCALC, TRIG, CHOLHDL, LDLDIRECT in the last 72 hours. Thyroid Function Tests: No results for input(s): TSH, T4TOTAL, FREET4, T3FREE, THYROIDAB in the last 72 hours. Anemia Panel: No results for input(s): VITAMINB12, FOLATE, FERRITIN, TIBC, IRON, RETICCTPCT in the last 72 hours. Sepsis Labs: Recent Labs  Lab 02/25/20 0404  PROCALCITON 0.15    Recent Results (from the past 240 hour(s))  Body fluid culture     Status: None   Collection Time: 02/20/20 11:11 AM   Specimen: PATH Cytology Peritoneal fluid  Result Value Ref Range Status   Specimen Description   Final    PERITONEAL Performed at [facility], [address], [place], [place] [address]    Special Requests   Final    NONE Performed at [facility], [address] [place], [place] [address]    Gram Stain NO WBC SEEN NO ORGANISMS SEEN CYTOSPIN SMEAR   Final   Culture   Final    NO GROWTH 3 DAYS Performed at [facility], [address], [place], [place] [address]    Report Status 02/23/2020 FINAL  Final  Culture, Urine     Status: Abnormal   Collection Time: 02/21/20  3:55 PM   Specimen: Urine, Clean Catch  Result Value Ref Range Status   Specimen Description   Final    URINE, CLEAN CATCH Performed at [facility], [address], [place], [place] [address]    Special Requests   Final    NONE Performed at [facility], [address] [place], [place] [address]    Culture MULTIPLE SPECIES PRESENT, SUGGEST RECOLLECTION (A)  Final   Report Status 02/22/2020 FINAL  Final  Body fluid culture     Status: None   Collection Time: 02/22/20   6:20 AM   Specimen: Peritoneal Washings; Body Fluid  Result Value Ref Range Status   Specimen Description  Final    PERITONEAL Performed at [facility], [address] [place], [place] [address]    Special Requests   Final    NONE Performed at [facility], [address] [place], [place] [address]    Gram Stain   Final    WBC PRESENT, PREDOMINANTLY MONONUCLEAR NO ORGANISMS SEEN CYTOSPIN SMEAR    Culture   Final    NO GROWTH 3 DAYS Performed at [facility], [address] [place], [place] [address]    Report Status 02/25/2020 FINAL  Final  Respiratory Panel by RT PCR (Flu A&B, Covid) - Nasopharyngeal Swab     Status: None   Collection Time: 02/22/20  6:37 AM   Specimen: Nasopharyngeal Swab  Result Value Ref Range Status   SARS Coronavirus 2 by RT PCR NEGATIVE NEGATIVE Final    Comment: (NOTE) SARS-CoV-2 target nucleic acids are NOT DETECTED.  The SARS-CoV-2 RNA is generally detectable in upper respiratoy specimens during the acute phase of infection. The lowest concentration of SARS-CoV-2 viral copies this assay can detect is 131 copies/mL. A negative result does not preclude SARS-Cov-2 infection and should not be used as the sole basis for treatment or other patient management decisions. A negative result may occur with  improper specimen collection/handling, submission of specimen other than nasopharyngeal swab, presence of viral mutation(s) within the areas targeted by this assay, and inadequate number of viral copies (<131 copies/mL). A negative result must be combined with clinical observations, patient history, and epidemiological information. The expected result is Negative.  Fact Sheet for Patients:  https://www.moore.com/  Fact Sheet for Healthcare Providers:  https://www.young.biz/  This test is no t yet approved or cleared by the [place] FDA and  has been authorized for detection  and/or diagnosis of SARS-CoV-2 by FDA under an Emergency Use Authorization (EUA). This EUA will remain  in effect (meaning this test can be used) for the duration of the COVID-19 declaration under Section 564(b)(1) of the Act, 21 U.S.C. section 360bbb-3(b)(1), unless the authorization is terminated or revoked sooner.     Influenza A by PCR NEGATIVE NEGATIVE Final   Influenza B by PCR NEGATIVE NEGATIVE Final    Comment: (NOTE) The Xpert Xpress SARS-CoV-2/FLU/RSV assay is intended as an aid in  the diagnosis of influenza from Nasopharyngeal swab specimens and  should not be used as a sole basis for treatment. Nasal washings and  aspirates are unacceptable for Xpert Xpress SARS-CoV-2/FLU/RSV  testing.  Fact Sheet for Patients: https://www.moore.com/  Fact Sheet for Healthcare Providers: https://www.young.biz/  This test is not yet approved or cleared by the [place] FDA and  has been authorized for detection and/or diagnosis of SARS-CoV-2 by  FDA under an Emergency Use Authorization (EUA). This EUA will remain  in effect (meaning this test can be used) for the duration of the  Covid-19 declaration under Section 564(b)(1) of the Act, 21  U.S.C. section 360bbb-3(b)(1), unless the authorization is  terminated or revoked. Performed at [facility], [address] [place], [place] [address]   Body fluid culture     Status: None   Collection Time: 02/26/20  9:20 AM   Specimen: PATH Cytology Pleural fluid  Result Value Ref Range Status   Specimen Description   Final    PLEURAL Performed at [facility], [address], [place], [place] [address]    Special Requests   Final    PLEURAL Performed at [facility], [address]  [place], [place] [address]    Gram Stain NO WBC SEEN NO ORGANISMS SEEN CYTOSPIN SMEAR   Final   Culture   Final    NO GROWTH Performed at [facility], [address],  [place], [place] [address]    Report Status 02/29/2020 FINAL  Final     Radiology Studies: US Paracentesis  Result Date: 02/28/2020 INDICATION: History of cirrhosis with recurrent symptomatic ascites. Please perform ultrasound-guided paracentesis for therapeutic purposes. EXAM: ULTRASOUND-GUIDED PARACENTESIS COMPARISON:  Multiple previous ultrasound-guided paracenteses, most recently on 02/20/2020 yielding 3.6 L of ascitic fluid. MEDICATIONS: None. COMPLICATIONS: None immediate. TECHNIQUE: Informed written consent was obtained from the patient after a discussion of the risks, benefits and alternatives to treatment. A timeout was performed prior to the initiation of the procedure. Initial ultrasound scanning demonstrates a large amount of ascites within the right lower abdomen which was subsequently prepped and draped in the usual sterile fashion. 1% lidocaine with epinephrine was used for local anesthesia. An ultrasound image was saved for documentation purposed. An 8 Fr Safe-T-Centesis catheter was introduced. The paracentesis was performed. The catheter was removed and a dressing was applied. The patient tolerated the procedure well without immediate post procedural complication. FINDINGS: A total of approximately 4.8 liters of serous fluid was removed. IMPRESSION: Successful ultrasound-guided paracentesis yielding 4.8 liters of peritoneal fluid. Electronically Signed   By: Simonne Come M.D.   On: 02/28/2020 12:24   DG Chest Port 1 View  Result Date: 02/29/2020 CLINICAL DATA:  Right pleural effusion EXAM: PORTABLE CHEST 1 VIEW COMPARISON:  02/27/2020 FINDINGS: Significant elevation of the right hemidiaphragm increased right pleural effusion and right lung atelectasis. Right perihilar opacity is partially obscured. Stable appearance of left lung. No pneumothorax. IMPRESSION: increased right pleural effusion and right lung atelectasis. Right perihilar opacity is partially obscured. Electronically Signed   By:  Guadlupe Spanish M.D.   On: 02/29/2020 09:08    Scheduled Meds: . sodium chloride   Intravenous Once  . chlorhexidine  15 mL Mouth Rinse BID  . ciprofloxacin  500 mg Oral Daily  . feeding supplement  237 mL Oral TID BM  . guaiFENesin  600 mg Oral BID  . lactulose  10 g Oral TID  . mouth rinse  15 mL Mouth Rinse q12n4p  . melatonin  5 mg Oral QHS  . multivitamin with minerals  1 tablet Oral Daily  . pantoprazole  40 mg Oral BID  . sodium chloride flush  3 mL Intravenous Q12H  . spironolactone  100 mg Oral Daily   Continuous Infusions: . sodium chloride    . octreotide  (SANDOSTATIN)    IV infusion 50 mcg/hr (03/01/20 0142)     LOS: 8 days   Time spent: 35 minutes.  Arnetha Courser, MD [facility]  If 7PM-7AM, please contact night-coverage [email or web address]  03/01/2020, 8:18 AM   This record has been created using Conservation officer, historic buildings. Errors have been sought and corrected,but may not always be located. Such creation errors do not reflect on the standard of care.

## 2020-03-01 NOTE — Progress Notes (Signed)
Pt arrived to [facility] [place] via [facility]. Admitting MD was notified through paging system.

## 2020-03-01 NOTE — Progress Notes (Signed)
Pt transferred to [facility] [place] bed 27 via [facility]. DC instruction packet given to [facility].Pt belongings went with him. Pt informed his family of the transfer. Report called to receiving nurse. VSS WDL on DC.

## 2020-03-01 NOTE — Progress Notes (Addendum)
[name] , MD [address], [place], [place], [address] [address], [place], [place], [address] Phone: [phone]  Fax: [phone]   Bruce Bell is being followed for variceal bleed, hepatic hydrothorax, decompensated liver disease.    Subjective:   I was text paged in the night about hematemesis.  [facility] had been contacted waiting for a bed.  No melena.  When I spoke to him this afternoon he denied any hematemesis but rather said there was blood on his pillow cover.  He had some blood at the angle of his mouth.  I clearly asked him if he threw up any blood at any point and he said no he does notice some blood on his pillow cover in the night when the nurse was alerted.  Denies any further episodes.  He has been having some nosebleeds for the last few days and bleeding gums and lips.  Denies any melena.  Had a brown bowel movement earlier today.  He was earlier short of breath but has been feeling better since his paracentesis.   Objective: Vital signs in last 24 hours: Vitals:   03/01/20 1135 03/01/20 1147 03/01/20 1215 03/01/20 1356  BP: (!) 94/59 (!) 86/72 102/67 105/75  Pulse: 95 91 93 91  Resp:    16  Temp:    (!) 97.5 F (36.4 C)  TempSrc:      SpO2: 99% 100% 100% 100%  Weight:      Height:       Weight change:   Intake/Output Summary (Last 24 hours) at 03/01/2020 1406 Last data filed at 03/01/2020 1056 Gross per 24 hour  Intake 1553 ml  Output 800 ml  Net 753 ml     Exam: Heart:: Regular rate and rhythm, S1S2 present or without murmur or extra heart sounds Lungs: normal, clear to auscultation and clear to auscultation and percussion Abdomen: soft, nontender, normal bowel sounds   Lab Results: @LABTEST2 @ Micro Results: Recent Results (from the past 240 hour(s))  Culture, Urine     Status: Abnormal   Collection Time: 02/21/20  3:55 PM   Specimen: Urine, Clean Catch  Result Value Ref Range Status   Specimen Description   Final      URINE, CLEAN CATCH Performed at [facility], [address], [place], [place]    Special Requests   Final    NONE Performed at [facility], [address], [place], [place]    Culture MULTIPLE SPECIES PRESENT, SUGGEST RECOLLECTION (A)  Final   Report Status 02/22/2020 FINAL  Final  Body fluid culture     Status: None   Collection Time: 02/22/20  6:20 AM   Specimen: Peritoneal Washings; Body Fluid  Result Value Ref Range Status   Specimen Description   Final    PERITONEAL Performed at [facility], [address] [place], [place]    Special Requests   Final    NONE Performed at [facility], [address] [place], [place]    Gram Stain   Final    WBC PRESENT, PREDOMINANTLY MONONUCLEAR NO ORGANISMS SEEN CYTOSPIN SMEAR    Culture   Final    NO GROWTH 3 DAYS Performed at [facility], [address], [place], [place]    Report Status 02/25/2020 FINAL  Final  Respiratory Panel by RT PCR (Flu A&B, Covid) - Nasopharyngeal Swab     Status: None  Collection Time: 02/22/20  6:37 AM   Specimen: Nasopharyngeal Swab  Result Value Ref Range Status   SARS Coronavirus 2 by RT PCR NEGATIVE NEGATIVE Final    Comment: (NOTE) SARS-CoV-2 target nucleic acids are NOT DETECTED.  The SARS-CoV-2 RNA is generally detectable in upper respiratoy specimens during the acute phase of infection. The lowest concentration of SARS-CoV-2 viral copies this assay can detect is 131 copies/mL. A negative result does not preclude SARS-Cov-2 infection and should not be used as the sole basis for treatment or other patient management decisions. A negative result may occur with  improper specimen collection/handling, submission of specimen other than nasopharyngeal swab, presence of viral mutation(s) within the areas targeted by this assay, and inadequate number of viral copies (<131  copies/mL). A negative result must be combined with clinical observations, patient history, and epidemiological information. The expected result is Negative.  Fact Sheet for Patients:  https://www.moore.com/  Fact Sheet for Healthcare Providers:  https://www.young.biz/  This test is no t yet approved or cleared by the [place] FDA and  has been authorized for detection and/or diagnosis of SARS-CoV-2 by FDA under an Emergency Use Authorization (EUA). This EUA will remain  in effect (meaning this test can be used) for the duration of the COVID-19 declaration under Section 564(b)(1) of the Act, 21 U.S.C. section 360bbb-3(b)(1), unless the authorization is terminated or revoked sooner.     Influenza A by PCR NEGATIVE NEGATIVE Final   Influenza B by PCR NEGATIVE NEGATIVE Final    Comment: (NOTE) The Xpert Xpress SARS-CoV-2/FLU/RSV assay is intended as an aid in  the diagnosis of influenza from Nasopharyngeal swab specimens and  should not be used as a sole basis for treatment. Nasal washings and  aspirates are unacceptable for Xpert Xpress SARS-CoV-2/FLU/RSV  testing.  Fact Sheet for Patients: https://www.moore.com/  Fact Sheet for Healthcare Providers: https://www.young.biz/  This test is not yet approved or cleared by the [place] FDA and  has been authorized for detection and/or diagnosis of SARS-CoV-2 by  FDA under an Emergency Use Authorization (EUA). This EUA will remain  in effect (meaning this test can be used) for the duration of the  Covid-19 declaration under Section 564(b)(1) of the Act, 21  U.S.C. section 360bbb-3(b)(1), unless the authorization is  terminated or revoked. Performed at [facility], [address] [place], [place] [address]   Body fluid culture     Status: None   Collection Time: 02/26/20  9:20 AM   Specimen: PATH Cytology Pleural fluid  Result Value  Ref Range Status   Specimen Description   Final    PLEURAL Performed at [facility], [address] [place], [place] [address]    Special Requests   Final    PLEURAL Performed at [facility], [address] [place], [place] [address]    Gram Stain NO WBC SEEN NO ORGANISMS SEEN CYTOSPIN SMEAR   Final   Culture   Final    NO GROWTH Performed at [facility], [address] [place], [place] [address]    Report Status 02/29/2020 FINAL  Final   Studies/Results: DG Chest 2 View  Result Date: 03/01/2020 CLINICAL DATA:  Pleural effusion EXAM: CHEST - 2 VIEW COMPARISON:  02/29/2020 FINDINGS: Large right pleural effusion has enlarged and now results in near complete collapse of the right lung with only a small portion of the right upper lobe now aerated. Left lung is clear. No pneumothorax. No pleural effusion on  the left. Mild cardiomegaly is stable. No acute bone abnormality. IMPRESSION: Enlarging right pleural effusion now with subtotal collapse of the right lung. Electronically Signed   By: Helyn Numbers MD   On: 03/01/2020 08:19   US Paracentesis  Result Date: 03/01/2020 INDICATION: Patient with a history of cirrhosis and recurrent large volume ascites. Interventional radiology asked to perform a therapeutic paracentesis. EXAM: ULTRASOUND GUIDED PARACENTESIS MEDICATIONS: 1% lidocaine 10 mL COMPLICATIONS: None immediate. PROCEDURE: Informed written consent was obtained from the patient after a discussion of the risks, benefits and alternatives to treatment. A timeout was performed prior to the initiation of the procedure. Initial ultrasound scanning demonstrates a large amount of ascites within the right lower abdominal quadrant. The right lower abdomen was prepped and draped in the usual sterile fashion. 1% lidocaine was used for local anesthesia. Following this, a 6 Fr Safe-T-Centesis catheter was introduced. An ultrasound image was saved for documentation  purposes. The paracentesis was performed. The catheter was removed and a dressing was applied. The patient tolerated the procedure well without immediate post procedural complication. FINDINGS: A total of approximately 3.8 L of dark yellow fluid was removed. IMPRESSION: Successful ultrasound-guided paracentesis yielding 3.8 liters of peritoneal fluid. Read by: Alwyn Ren, NP Electronically Signed   By: Simonne Come M.D.   On: 03/01/2020 12:47   DG Chest Port 1 View  Result Date: 03/01/2020 CLINICAL DATA:  Thoracentesis EXAM: PORTABLE CHEST 1 VIEW COMPARISON:  Earlier today FINDINGS: Marked reduction in the right pleural effusion. No pneumothorax or re-expansion edema. Left lung is clear. Prominence of the right hilum which may be from incomplete re-expansion or perihilar fissural fluid. Normal for technique heart size. IMPRESSION: Marked reduction in the right pleural effusion without complicating feature. Electronically Signed   By: Marnee Spring M.D.   On: 03/01/2020 11:57   DG Chest Port 1 View  Result Date: 02/29/2020 CLINICAL DATA:  Right pleural effusion EXAM: PORTABLE CHEST 1 VIEW COMPARISON:  02/27/2020 FINDINGS: Significant elevation of the right hemidiaphragm increased right pleural effusion and right lung atelectasis. Right perihilar opacity is partially obscured. Stable appearance of left lung. No pneumothorax. IMPRESSION: increased right pleural effusion and right lung atelectasis. Right perihilar opacity is partially obscured. Electronically Signed   By: Guadlupe Spanish M.D.   On: 02/29/2020 09:08   CT Angio Abd/Pel w/ and/or w/o  Result Date: 03/01/2020 CLINICAL DATA:  History of cirrhosis, now with liver failure and recurrent upper GI bleeding. Please perform BRT0 protocol CT for potential TIPS intervention. Note, patient underwent a thoracentesis and a paracentesis prior to this CT. EXAM: CTA ABDOMEN AND PELVIS WITHOUT AND WITH CONTRAST TECHNIQUE: Multidetector CT imaging of the  abdomen and pelvis was performed using the standard protocol during bolus administration of intravenous contrast. Multiplanar reconstructed images and MIPs were obtained and reviewed to evaluate the vascular anatomy. CONTRAST:  OMNIPAQUE IOHEXOL 350 MG/ML SOLN COMPARISON:  None. FINDINGS: VASCULAR Aorta: Normal caliber the abdominal aorta. There is no significant atherosclerotic plaque within the abdominal aorta. No abdominal aortic dissection or periaortic stranding. Celiac: Widely patent without a hemodynamically significant narrowing. SMA: Widely patent without hemodynamically significant narrowing. The common hepatic artery is noted to arise from the proximal SMA. The distal tributaries the SMA appear widely patent without discrete intraluminal filling defect to suggest distal embolism. Renals: Solitary bilaterally; the bilateral renal arteries are widely patent without hemodynamically significant narrowing. No discrete vessel irregularity to suggest FMD. IMA: Widely patent without hemodynamically significant narrowing. Inflow: The bilateral common,  external and internal iliac arteries are of normal caliber and widely patent without hemodynamically significant narrowing. Proximal Outflow: The bilateral common and imaged portions of the bilateral deep and superficial femoral arteries are normal caliber and widely patent without hemodynamically significant narrowing. Veins: The IVC and pelvic venous systems appear widely patent. The portal vein remains widely patent. Note is made of a hypertrophied splenorenal shunt though these do not appear to significantly contribute to hypertrophied gastric varices. There is partial recanalization of the periumbilical veins. Note is made of a hypertrophied mesenteric venous collaterals about the inferior tip of the spleen. The hepatic venous system this suboptimally opacified due to phase of enhancement though appears patent. Suspected common origin of the middle and  left hepatic veins. Review of the MIP images confirms the above findings. _________________________________________________________ NON-VASCULAR Lower chest: Limited visualization of the lower thorax demonstrates a small residual right-sided pleural effusion post thoracentesis. Improved aeration of right lower lobe with residual ill-defined subpleural ground-glass opacities. Note is also made of ill-defined ground-glass opacities within the left lower lobe. No associated air bronchograms. Borderline cardiomegaly.  No pericardial effusion. Hepatobiliary: Shrunken liver with nodularity hepatic contour. There are no discrete hyperenhancing hepatic lesions. Small amount of residual perihepatic and intra-abdominal ascites following recent paracentesis. Clustered radiopaque gallstones are seen within the neck of the gallbladder. There is a dominant peripherally calcified gallstone also within the mid aspect of the gallbladder which measures approximately 4.5 x 2.8 x 2.8 cm. There is a minimal amount of calcification involving gallbladder fundus without gallbladder wall thickening or definitive pericholecystic fluid given the presence of intra-abdominal ascites. Pancreas: Normal appearance of the pancreas. Spleen: The spleen is enlarged measuring 19.5 cm in length. Adrenals/Urinary Tract: There is symmetric enhancement and excretion of the bilateral kidneys. Nonobstructing renal stones are seen bilaterally including dominant stone within the inferior pole the right kidney measuring approximately 1.1 x 0.7 cm (image 46, series 2). Note is made of an approximately 2.2 cm nonenhancing minimally complex right renal cyst (image 63, series 3), as well as a more simple appearing approximately 2.1 cm cyst within the anterior inferior aspect the right kidney. Additional smaller renal cysts are seen bilaterally. Additional subcentimeter hypoattenuating renal lesions are too small to accurately characterize though favored to  represent additional renal cysts. Normal appearance of the bilateral adrenal glands. Normal appearance of the urinary bladder given degree distention. Stomach/Bowel: Several varices are noted about the distal esophagus and GE junction without significant intraluminal extension. Moderate colonic stool burden without evidence of enteric obstruction. No discrete areas of bowel wall thickening. No discrete areas of intraluminal contrast extravasation. Normal appearance of the terminal ileum. There is a suspected appendicolith within the base of an otherwise normal-appearing appendix. No pneumoperitoneum, pneumatosis or portal venous gas. Lymphatic: No bulky retroperitoneal, mesenteric, pelvic or inguinal lymphadenopathy. Reproductive: Dystrophic calcifications within normal sized prostate gland. Several prominent phleboliths are seen with the left hemipelvis. There is a small amount of ascites within the lower pelvis. Other: Diffuse body wall anasarca. Musculoskeletal: No acute or aggressive osseous abnormalities. Moderate DDD of T11-T12 with disc space height loss, endplate irregularity, Schmorl's node formation within the superior endplate of the T12 vertebral body and ex vacuo disc phenomena. IMPRESSION: VASCULAR 1. Patent portal venous system with conventional branching pattern. 2. Suboptimal visualization of the hepatic venous system though it appears patent with suspected common origin of the left and middle hepatic veins. 3. 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 4. Hypertrophied splenorenal shunt however  this not appear to contribute to significant gastric varices. NON-VASCULAR 1. Cirrhosis and the stigmata of portal venous hypertension including splenomegaly, intra-abdominal ascites and right-sided hepatic hydrothorax. 2. No discrete hyperenhancing lesions to suggest hepatocellular carcinoma. 3. Cholelithiasis without evidence of cholecystitis.  Electronically Signed   By: Simonne ComeJohn  Watts M.D.   On: 03/01/2020 13:56   US THORACENTESIS ASP PLEURAL SPACE W/IMG GUIDE  Result Date: 03/01/2020 INDICATION: Patient with a history of cirrhosis and recurrent pleural effusions. Interventional radiology asked to perform therapeutic thoracentesis. EXAM: ULTRASOUND GUIDED THORACENTESIS MEDICATIONS: 1% lidocaine 10 mL COMPLICATIONS: None immediate. PROCEDURE: An ultrasound guided thoracentesis was thoroughly discussed with the patient and questions answered. The benefits, risks, alternatives and complications were also discussed. The patient understands and wishes to proceed with the procedure. Written consent was obtained. Ultrasound was performed to localize and mark an adequate pocket of fluid in the right chest. The area was then prepped and draped in the normal sterile fashion. 1% Lidocaine was used for local anesthesia. Under ultrasound guidance a 6 Fr Safe-T-Centesis catheter was introduced. Thoracentesis was performed. The catheter was removed and a dressing applied. FINDINGS: A total of approximately 4.8 L of amber color fluid was removed. IMPRESSION: Successful ultrasound guided right thoracentesis yielding 4.8 L of pleural fluid. Read by: Alwyn RenJamie Covington, NP Electronically Signed   By: Simonne ComeJohn  Watts M.D.   On: 03/01/2020 12:05   Medications: I have reviewed the patient's current medications. Scheduled Meds: . sodium chloride   Intravenous Once  . sodium chloride   Intravenous Once  . chlorhexidine  15 mL Mouth Rinse BID  . ciprofloxacin  500 mg Oral Daily  . feeding supplement  237 mL Oral TID BM  . guaiFENesin  600 mg Oral BID  . lactulose  10 g Oral TID  . mouth rinse  15 mL Mouth Rinse q12n4p  . melatonin  5 mg Oral QHS  . multivitamin with minerals  1 tablet Oral Daily  . pantoprazole  40 mg Oral BID  . sodium chloride flush  3 mL Intravenous Q12H  . spironolactone  100 mg Oral Daily   Continuous Infusions: . sodium chloride    .  octreotide  (SANDOSTATIN)    IV infusion 50 mcg/hr (03/01/20 1329)   PRN Meds:.sodium chloride, chlorpheniramine-HYDROcodone, hydrocortisone cream, hydrOXYzine, ondansetron **OR** ondansetron (ZOFRAN) IV, sodium chloride flush, traMADol   Assessment: Principal Problem:   GI bleed Active Problems:   Hyponatremia   Decompensated liver disease (HCC)   Decompensated hepatic cirrhosis (HCC)   SBP (spontaneous bacterial peritonitis) (HCC)   Hematemesis with nausea   Secondary esophageal varices with bleeding (HCC)   Portal hypertensive gastropathy (HCC)   Protein-calorie malnutrition, severe   Ascites   Status post thoracentesis   Onalee HuaDavid [id number] y.o.malethe history with a history of alcoholic liver cirrhosis admitted on 02/22/2020. Admitted with hematemesis, acute liver failure negative for spontaneous bacterial peritonitis on paracentesis. Also has a pleural effusion consideration for hepatic hydrothorax. Had an EGD on 02/22/2020 that demonstrated 3 columns of grade 2 varices with stigmata of recent bleeding with red wale signs. 5 bands were placed. Mild portal hypertensive gastropathy. S/pdiagnostic thoracocentesis-transudate.   Last night nursing was alerted and passed on a message to the on-call nurse practitioner who in turn text paged me that the patient was having "hematemesis".  Unclear if the patient was assessed and evaluated for hematemesis or was a message that was passed across.  The patient himself denies throwing up any blood but rather states he woke  up with some blood on the pillowcase.  He has had epistaxis and bleeding gums and bleeding lips.  In fact at this point of time he did have bleeding on his lips as well.  He has brown stools presently.  No drop in hemoglobin.  This does not fit the picture of a variceal bleed rebleed.  I have discussed this with Dr. Nelson Chimes.   Plan 1.  Monitor CBC closely, if there are reports of further hematemesis would suggest the  on-call to please evaluate the patient to determine if it is bleeding lips/gums, epistaxis or actual vomiting of blood.  If this is true hematemesis and a failure of his banding he would need to be transferred out to [facility] or [facility].  I did discuss with the radiologist at [facility] and it was felt that due to his high meld score he would be best served at a tertiary center such as [facility] or [facility].  Continue octreotide and reevaluate in 24 hours  2.  Status post thoracocentesis and paracentesis, administered 50 g of 25% albumin if not already received.  Low-salt diet.  Continue Aldactone.  Monitor CMP tomorrow.  LOS: 8 days   [name], MD 03/01/2020, 2:06 PM

## 2020-03-01 NOTE — Progress Notes (Signed)
Cross Cover Note Patient with evidence of variceal banding treatment has failed secondary to restart of bloody vomitus. Dr. Tobi Bastos informed via secure chat and via text page of bleeding,  [facility] transfer center was contacted. There ae no beds available

## 2020-03-01 NOTE — Procedures (Signed)
PROCEDURE SUMMARY:  Successful US guided right thoracentesis. Yielded 4.8 L of amber-colored fluid. Pt tolerated procedure well. No immediate complications.  CXR ordered; no post-procedure pneumothorax identified.   EBL < 2 mL  Mickie Kay, NP 03/01/2020 12:06 PM

## 2020-03-01 NOTE — Procedures (Signed)
PROCEDURE SUMMARY:  Successful US guided paracentesis from right abdomen.  Yielded 3.8 L of dark yellow fluid.  No immediate complications.  Pt tolerated well.  EBL < 2 mL  Mickie Kay, NP 03/01/2020 1:01 PM

## 2020-03-01 NOTE — Progress Notes (Signed)
Notified Np that pt had copious amounts of blood coming from his mouth. New orders acknowledged and pt is resting in the bed at this time.

## 2020-03-01 NOTE — H&P (Signed)
History and Physical   Bruce Bell XBW:[id number] DOB: 09/17/1985 DOA: 03/01/2020  PCP: [facility]  Patient coming from: [facility]  I have personally briefly reviewed patient's old medical records in [facility] EMR.  Chief Concern: hematemesis and thrombocytopenia   HPI: Bruce Bell is a 34 y.o. 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  He reports that previously he did not drink alcohol however during the pandemic patient endorses daily drinking with friends consuming approximately 1 bottle of wine nightly.  He was admitted to [facility] for acute GI bleed presumed secondary to liver cirrhosis.  GI was consulted at [facility].  He received ultrasound-guided thoracentesis as well as paracentesis.  His meld score continues to worsen at 31 due to elevated bilirubin and INR and therefore TIPS intervention is not recommended and only to be pursued in the setting of life-threatening upper GI bleed.  Has been recommended for patient to be considered for referral to tertiary center to perform hepatic transplant ([facility] or [facility]).   Currently in the [facility] health system, TIPS is only performed at [facility] and patient was transferred in case of emergent need.  At bedside Bruce Bell does not appear to be in acute distress.  He is awake and alert or alert and oriented to himself, age, current location, current year and month.  Review of systems negative for headache, vision changes, nausea, vomiting, fever, chills, cough, chest pain,, shortness of breath, abdominal pain, diarrhea, constipation, dysuria, hematuria, SI, and HI.  Social history: Former daily EtOH user, denies tobacco use, denies  recreational drug use.  Formerly worked in [employer].  Family history: Maternal grandmother passed from liver cirrhosis suspected to be Elita Boone.  [facility] course:  Bruce Bell is a 34 year old male with medical history significant for alcohol liver cirrhosis, history of SBP on chronic antibiotic therapy with ciprofloxacin.  Admitted to [facility] on 02/22/2020 for hematemesis concerning for variceal bleed.  He received EGD and 5 bands were placed.  Evaluated for TIPS procedure and thought to be high risk due to high meld score.  GI is trying to get him to [facility] for liver transplant list.  Patient received 3 units of packed red blood cells and 2 units of fresh frozen plasma and 1 unit of platelets at [place].  Review of Systems: As per HPI otherwise 10 point review of systems negative.  Past Medical History:  Diagnosis Date  . Anemia   . Cirrhosis (HCC)   . Dyspnea   . Hypervolemia   . Hyponatremia   . Liver cirrhosis [facility])    Past Surgical History:  Procedure Laterality Date  . ESOPHAGOGASTRODUODENOSCOPY N/A 02/22/2020   Procedure: ESOPHAGOGASTRODUODENOSCOPY (EGD);  Surgeon: Pasty Spillers, MD;  Location: [facility] ENDOSCOPY;  Service: Endoscopy;  Laterality: N/A;  . NO PAST SURGERIES    . PARACENTESIS     Social History:  reports that he has never smoked. He has never used smokeless tobacco. He reports previous alcohol use. He reports previous drug use.  No Known Allergies  Family History  Problem Relation Age of Onset  . Liver disease Maternal Grandmother    Family history: Family history reviewed and liver cirrhosis in maternal grandmother  Prior to Admission medications   Medication Sig  Start Date End Date Taking? Authorizing Provider  chlorhexidine (PERIDEX) 0.12 % solution 15 mLs by Mouth Rinse route 2 (two) times daily. 03/01/20   Manuela Schwartz, NP  ciprofloxacin (CIPRO) 500 MG tablet Take 500 mg by mouth daily.    [provider]    feeding supplement (ENSURE ENLIVE / ENSURE PLUS) LIQD Take 237 mLs by mouth 3 (three) times daily between meals. 03/01/20   Manuela Schwartz, NP  furosemide (LASIX) 40 MG tablet Take 40 mg by mouth.    [provider]  hydrOXYzine (ATARAX/VISTARIL) 25 MG tablet Take 1 tablet (25 mg total) by mouth 3 (three) times daily as needed for anxiety. 02/21/20   Rodolph Bong, MD  lactulose (CHRONULAC) 10 GM/15ML solution Take 15 mLs by mouth in the morning, at noon, and at bedtime. 01/30/20   [provider]  Multiple Vitamin (MULTIVITAMIN WITH MINERALS) TABS tablet Take 1 tablet by mouth daily. 03/02/20   Manuela Schwartz, NP  octreotide 500 mcg in sodium chloride 0.9 % 250 mL Inject 50 mcg/hr into the vein continuous. 03/01/20   Manuela Schwartz, NP  pantoprazole (PROTONIX) 40 MG tablet Take 40 mg by mouth daily.    [provider]  spironolactone (ALDACTONE) 100 MG tablet Take 100 mg by mouth daily.    [provider]   Physical Exam: Vitals:   03/01/20 2139 03/01/20 2338 03/02/20 0417  BP:  110/77 112/86  Resp: 18 20 20   Temp: 98.7 F (37.1 C) 98.4 F (36.9 C) 98.7 F (37.1 C)  TempSrc: Oral Oral Oral  Weight: 78.9 kg  78.7 kg   Constitutional: NAD, calm, comfortable Eyes: PERRL, lids and scleral icterus ENMT: Mucous membranes are moist. Posterior pharynx clear of any exudate or lesions.Normal dentition.  Neck: normal, supple, no masses, no thyromegaly Respiratory: clear to auscultation bilaterally, no wheezing, no crackles. Normal respiratory effort. No accessory muscle use.  Cardiovascular: Regular rate and rhythm, no murmurs / rubs / gallops. No extremity edema. 2+ pedal pulses. No carotid bruits.  Abdomen: enlarge abdomen, not distended, with stretch marks and some fluctuance, no tenderness, no masses palpated . Bowel sounds positive.  Musculoskeletal: no clubbing / cyanosis. No joint deformity upper and lower extremities. Good ROM, no  contractures. Normal muscle tone.  Skin: jaundice Neurologic: CN 2-12 grossly intact. Sensation intact, Strength 5/5 in all 4.  Psychiatric: Normal judgment and insight. Alert and oriented x 3. Normal mood.   Labs on Admission: I have personally reviewed following labs and imaging studies  CBC: Recent Labs  Lab 02/29/20 0523 02/29/20 1644 03/01/20 0214 03/01/20 0840 03/02/20 0231  WBC 5.9 6.6 5.9 5.8 5.3  NEUTROABS  --  4.6  --   --   --   HGB 6.9* 7.6* 7.5* 8.5* 7.8*  HCT 20.7* 22.1* 22.6* 24.8* 23.3*  MCV 105.1* 101.4* 102.7* 99.6 101.3*  PLT 47* 55* 53* 54* 44*   Basic Metabolic Panel: Recent Labs  Lab 02/25/20 0404 02/25/20 0404 02/26/20 0438 02/26/20 0438 02/27/20 0410 02/28/20 0409 02/29/20 0523 03/01/20 0214 03/02/20 0231  NA 122*   < > 120*   < > 127* 131* 132* 132* 130*  K 4.9   < > 4.6   < > 4.7 4.3 4.7 4.8 4.8  CL 94*   < > 92*   < > 96* 99 101 103 102  CO2 23   < > 20*   < > 23 25 24 22 22   GLUCOSE 93   < > 99   < >  94 127* 85 100* 143*  BUN 48*   < > 63*   < > 51* 32* 19 16 14   CREATININE 1.36*   < > 1.58*   < > 1.11 0.87 0.48* 0.53* 0.63  CALCIUM 7.6*   < > 7.7*   < > 8.4* 8.7* 8.7* 8.7* 8.6*  MG 2.1  --  2.2  --  2.3 2.3 2.2  --   --    < > = values in this interval not displayed.   GFR: Estimated Creatinine Clearance: 127.1 mL/min (by C-G formula based on SCr of 0.63 mg/dL).  Liver Function Tests: Recent Labs  Lab 02/28/20 0406 03/01/20 0214 03/02/20 0231  AST 42* 34 32  ALT 26 20 21   ALKPHOS 84 90 64  BILITOT 11.2* 12.9* 14.5*  PROT 6.5 6.2* 5.1*  ALBUMIN 4.5 4.6 3.6   Coagulation Profile: Recent Labs  Lab 03/01/20 0214 03/02/20 0231  INR 3.2* 3.3*   Urine analysis:    Component Value Date/Time   COLORURINE AMBER (A) 02/19/2020 1646   APPEARANCEUR CLEAR 02/19/2020 1646   LABSPEC 1.020 02/19/2020 1646   PHURINE 5.5 02/19/2020 1646   GLUCOSEU NEGATIVE 02/19/2020 1646   HGBUR MODERATE (A) 02/19/2020 1646   BILIRUBINUR SMALL  (A) 02/19/2020 1646   KETONESUR NEGATIVE 02/19/2020 1646   PROTEINUR NEGATIVE 02/19/2020 1646   NITRITE NEGATIVE 02/19/2020 1646   LEUKOCYTESUR TRACE (A) 02/19/2020 1646   Radiological Exams on Admission: Personally reviewed portable CXR on 03/01/20 and I agree with radiologist.  EKG: Independently reviewed EKG on 02/22/20, showing sinus tachycardia and no st-t wave changes.  Assessment/Plan  Active Problems:   Hematemesis with nausea   Secondary esophageal varices with bleeding (HCC)   Protein-calorie malnutrition, severe   Ascites   Status post thoracentesis   ALC (alcoholic liver cirrhosis) (HCC)   Status post abdominal paracentesis   # Hematemesis secondary to variceal bleed status post EGD and EBL - Secondary to alcoholic liver cirrhosis - Continue to monitor CBC - Patient has been evaluated for TIPS procedure via GI s specialist at [facility] however it was felt the patient is not a good candidate for TIPS procedure and should only be done in case of life-threatening GI bleed - Has received 3 units of packed red blood at [facility], two units of FFP, and 1 unit of platelets at [facility] - Continue Protonix twice daily  # Decompensated liver cirrhosis secondary to EtOH use and alcoholic coagulopathy and thrombocytopenia with recurrent ascites/portal hypertension -Per chart review prior work-up done at [facility] and was negative for hepatitis B, hepatitis C, HIV, positive hepatitis A IgG, negative ASMA/AMA, AFP level - His meld score was 31  - He had repeat paracentesis on 03/01/2020 with removal of 3.8 L for symptomatic relief, no labs done for SBP as recent labs were negative for SBP - Continue low-sodium diet - Continue Lasix 40 mg daily - Continue Aldactone 100 mg daily - Continue to monitor liver function and iron  - Patient to follow-up with [facility]. [facility] as outpatient to refer patient for [facility] at [facility]  # History of SBP-was ruled out at [facility], continue prophylactic  Cipro 500 mg daily - No clinical suspicion for SBP at this time  # Hyponatremia secondary to liver cirrhosis, continue to monitor # History of alcoholism-patient reports he has been sober for over 1 year # Protein malnutrition - continue supplements   DVT prophylaxis: high risk for bleeding Code Status: FULL Diet: low sodium Disposition Plan:  pending clinical course Consults called: GI  Admission status: obs with telemetry   Arial Galligan N Asharia Lotter D.O. [facility]  If 7AM-7PM, please contact day-coverage provider [email or web address]  03/02/2020, 5:48 AM

## 2020-03-02 DIAGNOSIS — Z7682 Awaiting organ transplant status: Secondary | ICD-10-CM | POA: Diagnosis not present

## 2020-03-02 DIAGNOSIS — J918 Pleural effusion in other conditions classified elsewhere: Secondary | ICD-10-CM | POA: Diagnosis present

## 2020-03-02 DIAGNOSIS — K7031 Alcoholic cirrhosis of liver with ascites: Secondary | ICD-10-CM | POA: Diagnosis present

## 2020-03-02 DIAGNOSIS — K766 Portal hypertension: Secondary | ICD-10-CM | POA: Diagnosis present

## 2020-03-02 DIAGNOSIS — K922 Gastrointestinal hemorrhage, unspecified: Secondary | ICD-10-CM | POA: Diagnosis present

## 2020-03-02 DIAGNOSIS — E43 Unspecified severe protein-calorie malnutrition: Secondary | ICD-10-CM | POA: Diagnosis present

## 2020-03-02 DIAGNOSIS — F1021 Alcohol dependence, in remission: Secondary | ICD-10-CM | POA: Diagnosis present

## 2020-03-02 DIAGNOSIS — Z79899 Other long term (current) drug therapy: Secondary | ICD-10-CM | POA: Diagnosis not present

## 2020-03-02 DIAGNOSIS — Z6824 Body mass index (BMI) 24.0-24.9, adult: Secondary | ICD-10-CM | POA: Diagnosis not present

## 2020-03-02 DIAGNOSIS — D689 Coagulation defect, unspecified: Secondary | ICD-10-CM | POA: Diagnosis present

## 2020-03-02 DIAGNOSIS — E871 Hypo-osmolality and hyponatremia: Secondary | ICD-10-CM | POA: Diagnosis present

## 2020-03-02 DIAGNOSIS — I8511 Secondary esophageal varices with bleeding: Secondary | ICD-10-CM | POA: Diagnosis present

## 2020-03-02 DIAGNOSIS — Z9889 Other specified postprocedural states: Secondary | ICD-10-CM | POA: Diagnosis present

## 2020-03-02 DIAGNOSIS — Z792 Long term (current) use of antibiotics: Secondary | ICD-10-CM | POA: Diagnosis not present

## 2020-03-02 DIAGNOSIS — D6959 Other secondary thrombocytopenia: Secondary | ICD-10-CM | POA: Diagnosis present

## 2020-03-02 LAB — COMPREHENSIVE METABOLIC PANEL
ALT: 21 U/L (ref 0–44)
AST: 32 U/L (ref 15–41)
Albumin: 3.6 g/dL (ref 3.5–5.0)
Alkaline Phosphatase: 64 U/L (ref 38–126)
Anion gap: 6 (ref 5–15)
BUN: 14 mg/dL (ref 6–20)
CO2: 22 mmol/L (ref 22–32)
Calcium: 8.6 mg/dL — ABNORMAL LOW (ref 8.9–10.3)
Chloride: 102 mmol/L (ref 98–111)
Creatinine, Ser: 0.63 mg/dL (ref 0.61–1.24)
GFR, Estimated: >60 mL/min (ref >60)
Glucose, Bld: 143 mg/dL — ABNORMAL HIGH (ref 70–99)
Potassium: 4.8 mmol/L (ref 3.5–5.1)
Sodium: 130 mmol/L — ABNORMAL LOW (ref 135–145)
Total Bilirubin: 14.5 mg/dL — ABNORMAL HIGH (ref 0.3–1.2)
Total Protein: 5.1 g/dL — ABNORMAL LOW (ref 6.5–8.1)

## 2020-03-02 LAB — TYPE AND SCREEN
ABO/RH(D): B POS
Antibody Screen: NEGATIVE
Unit division: 0
Unit division: 0
Unit division: 0

## 2020-03-02 LAB — BPAM RBC
Blood Product Expiration Date: 202110202359
Blood Product Expiration Date: 202111122359
Blood Product Expiration Date: 202111212359
ISSUE DATE / TIME: 202110190931
ISSUE DATE / TIME: 202110211006
ISSUE DATE / TIME: 202110220314
Unit Type and Rh: 5100
Unit Type and Rh: 7300
Unit Type and Rh: 9500

## 2020-03-02 LAB — APTT: aPTT: 49 seconds — ABNORMAL HIGH (ref 24–36)

## 2020-03-02 LAB — CBC
HCT: 23.3 % — ABNORMAL LOW (ref 39.0–52.0)
Hemoglobin: 7.8 g/dL — ABNORMAL LOW (ref 13.0–17.0)
MCH: 33.9 pg (ref 26.0–34.0)
MCHC: 33.5 g/dL (ref 30.0–36.0)
MCV: 101.3 fL — ABNORMAL HIGH (ref 80.0–100.0)
Platelets: 44 10*3/uL — ABNORMAL LOW (ref 150–400)
RBC: 2.3 MIL/uL — ABNORMAL LOW (ref 4.22–5.81)
RDW: 19.4 % — ABNORMAL HIGH (ref 11.5–15.5)
WBC: 5.3 10*3/uL (ref 4.0–10.5)
nRBC: 0 % (ref 0.0–0.2)

## 2020-03-02 LAB — PROTIME-INR
INR: 3.3 — ABNORMAL HIGH (ref 0.8–1.2)
Prothrombin Time: 32.3 seconds — ABNORMAL HIGH (ref 11.4–15.2)

## 2020-03-02 MED ORDER — PANTOPRAZOLE SODIUM 40 MG PO TBEC
40.0000 mg | DELAYED_RELEASE_TABLET | Freq: Two times a day (BID) | ORAL | Status: DC
  Administered 2020-03-02 – 2020-03-05 (×7): 40 mg via ORAL
  Filled 2020-03-02 (×7): qty 1

## 2020-03-02 NOTE — Consult Note (Signed)
[facility] Consultation Note  Referring Provider: [facility] Primary Care Physician:  [facility]  Reason for Consultation:  hematemesis  HPI: Bruce Bell is a 34 y.o. male with decompensated cirrhosis presenting here for refractory hematemesis after EVBL in [place].  He doesn't have any hematemesis or blood in stool here.  Alcohol possible cause of liver disease, but no alcohol in over one year.  Had been worked up for liver transplantation in [place], [place], but moved here recently to be close to family.  No abdominal pain.  Breathing better after thoracentesis.   Past Medical History:  Diagnosis Date  . Anemia   . Cirrhosis (HCC)   . Dyspnea   . Hypervolemia   . Hyponatremia   . Liver cirrhosis [facility])     Past Surgical History:  Procedure Laterality Date  . ESOPHAGOGASTRODUODENOSCOPY N/A 02/22/2020   Procedure: ESOPHAGOGASTRODUODENOSCOPY (EGD);  Surgeon: Pasty Spillers, MD;  Location: [facility] ENDOSCOPY;  Service: Endoscopy;  Laterality: N/A;  . NO PAST SURGERIES    . PARACENTESIS      Prior to Admission medications   Medication Sig Start Date End Date Taking? Authorizing Provider  chlorhexidine (PERIDEX) 0.12 % solution 15 mLs by Mouth Rinse route 2 (two) times daily. 03/01/20  Yes Manuela Schwartz, NP  ciprofloxacin (CIPRO) 500 MG tablet Take 500 mg by mouth daily.   Yes [provider]  feeding supplement (ENSURE ENLIVE / ENSURE PLUS) LIQD Take 237 mLs by mouth 3 (three) times daily between meals. 03/01/20  Yes Manuela Schwartz, NP  furosemide (LASIX) 40 MG tablet Take 40 mg by mouth.   Yes [provider]  hydrOXYzine (ATARAX/VISTARIL) 25 MG tablet Take 1 tablet (25 mg total) by mouth 3 (three) times daily as needed for anxiety. 02/21/20  Yes Rodolph Bong, MD  lactulose (CHRONULAC) 10 GM/15ML solution Take 15 mLs by mouth in the morning, at noon, and at bedtime. 01/30/20  Yes [provider]  Multiple  Vitamin (MULTIVITAMIN WITH MINERALS) TABS tablet Take 1 tablet by mouth daily. 03/02/20  Yes Manuela Schwartz, NP  octreotide 500 mcg in sodium chloride 0.9 % 250 mL Inject 50 mcg/hr into the vein continuous. 03/01/20  Yes Manuela Schwartz, NP  pantoprazole (PROTONIX) 40 MG tablet Take 40 mg by mouth daily.   Yes [provider]  spironolactone (ALDACTONE) 100 MG tablet Take 100 mg by mouth daily.   Yes [provider]    Current Facility-Administered Medications  Medication Dose Route Frequency Provider Last Rate Last Admin  . chlorhexidine (PERIDEX) 0.12 % solution 15 mL  15 mL Mouth Rinse BID Cox, Amy N, DO   15 mL at 03/02/20 0134  . ciprofloxacin (CIPRO) tablet 500 mg  500 mg Oral Daily Cox, Amy N, DO      . feeding supplement (ENSURE ENLIVE / ENSURE PLUS) liquid 237 mL  237 mL Oral TID BM Cox, Amy N, DO      . furosemide (LASIX) tablet 40 mg  40 mg Oral Daily Cox, Amy N, DO      . hydrOXYzine (ATARAX/VISTARIL) tablet 25 mg  25 mg Oral TID PRN Cox, Amy N, DO      . lactulose (CHRONULAC) 10 GM/15ML solution 10 g  10 g Oral TID Cox, Amy N, DO   10 g at 03/02/20 0134  . multivitamin with minerals tablet 1 tablet  1 tablet Oral Daily Cox, Amy N, DO      . octreotide (SANDOSTATIN) 500 mcg in sodium  chloride 0.9 % 250 mL (2 mcg/mL) infusion  50 mcg/hr Intravenous Continuous Cox, Amy N, DO 25 mL/hr at 03/02/20 0129 50 mcg/hr at 03/02/20 0129  . pantoprazole (PROTONIX) EC tablet 40 mg  40 mg Oral BID Cox, Amy N, DO      . spironolactone (ALDACTONE) tablet 100 mg  100 mg Oral Daily Cox, Amy N, DO        Allergies as of 03/01/2020  . (No Known Allergies)    Family History  Problem Relation Age of Onset  . Liver disease Maternal Grandmother     Social History   Socioeconomic History  . Marital status: Single    Spouse name: Not on file  . Number of children: Not on file  . Years of education: Not on file  . Highest education level: Not on file  Occupational History   . Not on file  Tobacco Use  . Smoking status: Never Smoker  . Smokeless tobacco: Never Used  Vaping Use  . Vaping Use: Never used  Substance and Sexual Activity  . Alcohol use: Not Currently  . Drug use: Not Currently  . Sexual activity: Not on file  Other Topics Concern  . Not on file  Social History Narrative  . Not on file   Social Determinants of Health   Financial Resource Strain:   . Difficulty of Paying Living Expenses: Not on file  Food Insecurity:   . Worried About Programme researcher, broadcasting/film/video in the Last Year: Not on file  . Ran Out of Food in the Last Year: Not on file  Transportation Needs:   . Lack of Transportation (Medical): Not on file  . Lack of Transportation (Non-Medical): Not on file  Physical Activity:   . Days of Exercise per Week: Not on file  . Minutes of Exercise per Session: Not on file  Stress:   . Feeling of Stress : Not on file  Social Connections:   . Frequency of Communication with Friends and Family: Not on file  . Frequency of Social Gatherings with Friends and Family: Not on file  . Attends Religious Services: Not on file  . Active Member of Clubs or Organizations: Not on file  . Attends Banker Meetings: Not on file  . Marital Status: Not on file  Intimate Partner Violence:   . Fear of Current or Ex-Partner: Not on file  . Emotionally Abused: Not on file  . Physically Abused: Not on file  . Sexually Abused: Not on file    Review of Systems: As per HPI, all others negative  Physical Exam: Vital signs in last 24 hours: Temp:  [97.5 F (36.4 C)-98.8 F (37.1 C)] 98.7 F (37.1 C) (10/23 0417) Pulse Rate:  [91-105] 105 (10/22 2034) Resp:  [16-22] 20 (10/23 0417) BP: (86-121)/(59-86) 112/86 (10/23 0417) SpO2:  [96 %-100 %] 100 % (10/22 2034) Weight:  [78.7 kg-78.9 kg] 78.7 kg (10/23 0417)   General:   Alert, jaundiced, thin with some muscular atrophy Head:  Normocephalic and atraumatic. Eyes:  Sclera jaundiced bilaterally,  Conjunctiva pink. Ears:  Normal auditory acuity. Nose:  No deformity, discharge,  or lesions. Mouth:  No deformity or lesions.  Oropharynx pink & moist. Neck:  Supple; no masses or thyromegaly..     Msk:  Symmetrical without gross deformities. Normal posture. Pulses:  Normal pulses noted. Extremities:  Without clubbing or edema. Neurologic:  Alert and  oriented x4;  grossly normal neurologically. Skin:  Some ecchymoses, otherwise intact  without significant lesions or rashes. Psych:  Alert and cooperative. Normal mood and affect.   Lab Results: Recent Labs    03/01/20 0214 03/01/20 0840 03/02/20 0231  WBC 5.9 5.8 5.3  HGB 7.5* 8.5* 7.8*  HCT 22.6* 24.8* 23.3*  PLT 53* 54* 44*   BMET Recent Labs    02/29/20 0523 03/01/20 0214 03/02/20 0231  NA 132* 132* 130*  K 4.7 4.8 4.8  CL 101 103 102  CO2 GLUCOSE 85 100* 143*  BUN CREATININE 0.48* 0.53* 0.63  CALCIUM 8.7* 8.7* 8.6*   LFT Recent Labs    03/02/20 0231  PROT 5.1*  ALBUMIN 3.6  AST 32  ALT 21  ALKPHOS 64  BILITOT 14.5*   PT/INR Recent Labs    03/01/20 0214 03/02/20 0231  LABPROT 31.6* 32.3*  INR 3.2* 3.3*    Studies/Results: DG Chest 2 View  Result Date: 03/01/2020 CLINICAL DATA:  Pleural effusion EXAM: CHEST - 2 VIEW COMPARISON:  02/29/2020 FINDINGS: Large right pleural effusion has enlarged and now results in near complete collapse of the right lung with only a small portion of the right upper lobe now aerated. Left lung is clear. No pneumothorax. No pleural effusion on the left. Mild cardiomegaly is stable. No acute bone abnormality. IMPRESSION: Enlarging right pleural effusion now with subtotal collapse of the right lung. Electronically Signed   By: Helyn Numbers MD   On: 03/01/2020 08:19   US Paracentesis  Result Date: 03/01/2020 INDICATION: Patient with a history of cirrhosis and recurrent large volume ascites. Interventional radiology asked to perform a therapeutic  paracentesis. EXAM: ULTRASOUND GUIDED PARACENTESIS MEDICATIONS: 1% lidocaine 10 mL COMPLICATIONS: None immediate. PROCEDURE: Informed written consent was obtained from the patient after a discussion of the risks, benefits and alternatives to treatment. A timeout was performed prior to the initiation of the procedure. Initial ultrasound scanning demonstrates a large amount of ascites within the right lower abdominal quadrant. The right lower abdomen was prepped and draped in the usual sterile fashion. 1% lidocaine was used for local anesthesia. Following this, a 6 Fr Safe-T-Centesis catheter was introduced. An ultrasound image was saved for documentation purposes. The paracentesis was performed. The catheter was removed and a dressing was applied. The patient tolerated the procedure well without immediate post procedural complication. FINDINGS: A total of approximately 3.8 L of dark yellow fluid was removed. IMPRESSION: Successful ultrasound-guided paracentesis yielding 3.8 liters of peritoneal fluid. Read by: Alwyn Ren, NP Electronically Signed   By: Simonne Come M.D.   On: 03/01/2020 12:47   DG Chest Port 1 View  Result Date: 03/01/2020 CLINICAL DATA:  Thoracentesis EXAM: PORTABLE CHEST 1 VIEW COMPARISON:  Earlier today FINDINGS: Marked reduction in the right pleural effusion. No pneumothorax or re-expansion edema. Left lung is clear. Prominence of the right hilum which may be from incomplete re-expansion or perihilar fissural fluid. Normal for technique heart size. IMPRESSION: Marked reduction in the right pleural effusion without complicating feature. Electronically Signed   By: Marnee Spring M.D.   On: 03/01/2020 11:57   DG Chest Port 1 View  Result Date: 02/29/2020 CLINICAL DATA:  Right pleural effusion EXAM: PORTABLE CHEST 1 VIEW COMPARISON:  02/27/2020 FINDINGS: Significant elevation of the right hemidiaphragm increased right pleural effusion and right lung atelectasis. Right perihilar  opacity is partially obscured. Stable appearance of left lung. No pneumothorax. IMPRESSION: increased right pleural effusion and right lung atelectasis. Right perihilar opacity is partially obscured. Electronically Signed  By: Guadlupe Spanish M.D.   On: 02/29/2020 09:08   CT Angio Abd/Pel w/ and/or w/o  Result Date: 03/01/2020 CLINICAL DATA:  History of cirrhosis, now with liver failure and recurrent upper GI bleeding. Please perform BRT0 protocol CT for potential TIPS intervention. Note, patient underwent a thoracentesis and a paracentesis prior to this CT. EXAM: CTA ABDOMEN AND PELVIS WITHOUT AND WITH CONTRAST TECHNIQUE: Multidetector CT imaging of the abdomen and pelvis was performed using the standard protocol during bolus administration of intravenous contrast. Multiplanar reconstructed images and MIPs were obtained and reviewed to evaluate the vascular anatomy. CONTRAST:  OMNIPAQUE IOHEXOL 350 MG/ML SOLN COMPARISON:  None. FINDINGS: VASCULAR Aorta: Normal caliber the abdominal aorta. There is no significant atherosclerotic plaque within the abdominal aorta. No abdominal aortic dissection or periaortic stranding. Celiac: Widely patent without a hemodynamically significant narrowing. SMA: Widely patent without hemodynamically significant narrowing. The common hepatic artery is noted to arise from the proximal SMA. The distal tributaries the SMA appear widely patent without discrete intraluminal filling defect to suggest distal embolism. Renals: Solitary bilaterally; the bilateral renal arteries are widely patent without hemodynamically significant narrowing. No discrete vessel irregularity to suggest FMD. IMA: Widely patent without hemodynamically significant narrowing. Inflow: The bilateral common, external and internal iliac arteries are of normal caliber and widely patent without hemodynamically significant narrowing. Proximal Outflow: The bilateral common and imaged portions of the bilateral deep  and superficial femoral arteries are normal caliber and widely patent without hemodynamically significant narrowing. Veins: The IVC and pelvic venous systems appear widely patent. The portal vein remains widely patent. Note is made of a hypertrophied splenorenal shunt though these do not appear to significantly contribute to hypertrophied gastric varices. There is partial recanalization of the periumbilical veins. Note is made of a hypertrophied mesenteric venous collaterals about the inferior tip of the spleen. The hepatic venous system this suboptimally opacified due to phase of enhancement though appears patent. Suspected common origin of the middle and left hepatic veins. Review of the MIP images confirms the above findings. _________________________________________________________ NON-VASCULAR Lower chest: Limited visualization of the lower thorax demonstrates a small residual right-sided pleural effusion post thoracentesis. Improved aeration of right lower lobe with residual ill-defined subpleural ground-glass opacities. Note is also made of ill-defined ground-glass opacities within the left lower lobe. No associated air bronchograms. Borderline cardiomegaly.  No pericardial effusion. Hepatobiliary: Shrunken liver with nodularity hepatic contour. There are no discrete hyperenhancing hepatic lesions. Small amount of residual perihepatic and intra-abdominal ascites following recent paracentesis. Clustered radiopaque gallstones are seen within the neck of the gallbladder. There is a dominant peripherally calcified gallstone also within the mid aspect of the gallbladder which measures approximately 4.5 x 2.8 x 2.8 cm. There is a minimal amount of calcification involving gallbladder fundus without gallbladder wall thickening or definitive pericholecystic fluid given the presence of intra-abdominal ascites. Pancreas: Normal appearance of the pancreas. Spleen: The spleen is enlarged measuring 19.5 cm in length.  Adrenals/Urinary Tract: There is symmetric enhancement and excretion of the bilateral kidneys. Nonobstructing renal stones are seen bilaterally including dominant stone within the inferior pole the right kidney measuring approximately 1.1 x 0.7 cm (image 46, series 2). Note is made of an approximately 2.2 cm nonenhancing minimally complex right renal cyst (image 63, series 3), as well as a more simple appearing approximately 2.1 cm cyst within the anterior inferior aspect the right kidney. Additional smaller renal cysts are seen bilaterally. Additional subcentimeter hypoattenuating renal lesions are too small to accurately characterize though  favored to represent additional renal cysts. Normal appearance of the bilateral adrenal glands. Normal appearance of the urinary bladder given degree distention. Stomach/Bowel: Several varices are noted about the distal esophagus and GE junction without significant intraluminal extension. Moderate colonic stool burden without evidence of enteric obstruction. No discrete areas of bowel wall thickening. No discrete areas of intraluminal contrast extravasation. Normal appearance of the terminal ileum. There is a suspected appendicolith within the base of an otherwise normal-appearing appendix. No pneumoperitoneum, pneumatosis or portal venous gas. Lymphatic: No bulky retroperitoneal, mesenteric, pelvic or inguinal lymphadenopathy. Reproductive: Dystrophic calcifications within normal sized prostate gland. Several prominent phleboliths are seen with the left hemipelvis. There is a small amount of ascites within the lower pelvis. Other: Diffuse body wall anasarca. Musculoskeletal: No acute or aggressive osseous abnormalities. Moderate DDD of T11-T12 with disc space height loss, endplate irregularity, Schmorl's node formation within the superior endplate of the T12 vertebral body and ex vacuo disc phenomena. IMPRESSION: VASCULAR 1. Patent portal venous system with conventional  branching pattern. 2. Suboptimal visualization of the hepatic venous system though it appears patent with suspected common origin of the left and middle hepatic veins. 3. 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 4. Hypertrophied splenorenal shunt however this not appear to contribute to significant gastric varices. NON-VASCULAR 1. Cirrhosis and the stigmata of portal venous hypertension including splenomegaly, intra-abdominal ascites and right-sided hepatic hydrothorax. 2. No discrete hyperenhancing lesions to suggest hepatocellular carcinoma. 3. Cholelithiasis without evidence of cholecystitis. Electronically Signed   By: Simonne ComeJohn  Watts M.D.   On: 03/01/2020 13:56   US THORACENTESIS ASP PLEURAL SPACE W/IMG GUIDE  Result Date: 03/01/2020 INDICATION: Patient with a history of cirrhosis and recurrent pleural effusions. Interventional radiology asked to perform therapeutic thoracentesis. EXAM: ULTRASOUND GUIDED THORACENTESIS MEDICATIONS: 1% lidocaine 10 mL COMPLICATIONS: None immediate. PROCEDURE: An ultrasound guided thoracentesis was thoroughly discussed with the patient and questions answered. The benefits, risks, alternatives and complications were also discussed. The patient understands and wishes to proceed with the procedure. Written consent was obtained. Ultrasound was performed to localize and mark an adequate pocket of fluid in the right chest. The area was then prepped and draped in the normal sterile fashion. 1% Lidocaine was used for local anesthesia. Under ultrasound guidance a 6 Fr Safe-T-Centesis catheter was introduced. Thoracentesis was performed. The catheter was removed and a dressing applied. FINDINGS: A total of approximately 4.8 L of amber color fluid was removed. IMPRESSION: Successful ultrasound guided right thoracentesis yielding 4.8 L of pleural fluid. Read by: Alwyn RenJamie Covington, NP Electronically Signed   By: Simonne ComeJohn  Watts M.D.   On:  03/01/2020 12:05    Impression:  1.  Cirrhosis, decompensated (ascites, varices, hepatic hydrothorax s/p prior paracentesis and prior thoracentesis, coagulopathy).  Alcohol possible etiology (reports drinking heavily for a few years, last alcohol over one year ago, reports also being worked up for transplant of liver in [place] and having had several negative alcohol tests over the past year. 2.  Variceal bleed, EGD with EVBL few days ago in [place].  No evidence of recurrent bleeding.  Plan:  1.  I do not think patient has destabilizing refractory variceal bleeding.  I do not think patient needs TIPS or BRTO at this time (and is not good candidate for TIPS in either event, because of his high MELD score). 2.  Patient's primary need is for liver transplantation, as was mentioned both by outside gastroenterologist and outside Interventional radiologist, which is not done here.  I will contact [facility] to get sense of bed availability; could also try [facility] and [facility]. 3.  [facility] will follow.   LOS: 1 day   Lukas Pelcher M  03/02/2020, 8:03 AM  Cell [phone] If no answer or after 5 PM call [phone]

## 2020-03-02 NOTE — Plan of Care (Signed)

## 2020-03-02 NOTE — Plan of Care (Signed)
I discussed case with Dr. Audrie Lia, hepatologist at [facility].  Since patient at this time is stable, they recommend watching patient for the next couple days here to make sure he has no recurrence of his GI bleeding.  In the meantime, they will do some preliminary transplant analysis (including insurance and financial review).    If patient worsens over the weekend, I will call them back to arrange expedited hospital-to-hospital transfer.  Otherwise, may consider discharge home in a couple days with patient to follow-up with Dr. Brooke Dare as outpatient next week.

## 2020-03-02 NOTE — Progress Notes (Signed)
PROGRESS NOTE    Bruce Bell  ZOX:[id number] DOB: 03/13/1986 DOA: 03/01/2020 [facility]   Brief Narrative:  Bruce Bell a 34 y.o.malewith medical history significant foralcoholic liver cirrhosisas well ashistory of SBP on chronic antibiotic therapy with ciprofloxacinwho [place] to the areaand was discharged [facility] about 24 hours prior to presenting to the [facility]. He was seen [facility] for evaluation of decompensated liver cirrhosis withincreasing abdominal distention and shortness of breath. Patient underwent ultrasound-guided paracentesis while at [facility] with drainage of about 3.6 L of fluid. Further drainage was not done due to hypotension. [facility] and was negative for hepatitis B and C with positive hepatitis A IgG, negative ASMA/AMA.Patient also noted to have a normal AFP level.  Alpha 1 antitrypsin and ceruloplasmin tested during recent hospitalization was within normal limit. Patient was discharged in stable condition and advised to follow-up with GI as an outpatient for EGD to screen for esophageal varices.  Presented to ED for evaluation of diffuse, sharp abdominal pain.  Which started overnight after admission.  Associated with couple of episodes of emesis of bright red blood. Admitted for variceal bleed, EGD was done and 5 bands were placed. Also evaluated for TIPS procedure and thought to be high risk due to high meld score.  GI is [facility] to get [facility] list. Received 3 unit of PRBC and 2 units of fresh frozen plasma and 1 unit of platelet so far.  Subjective: Seen and examined.  Patient is extremely pleasant and well-informed about his medical health.  He denies having any complaint.  He is completely alert and oriented.  He tells me that he has stopped drinking alcohol since  November last year when he first knew about his cirrhosis.  Assessment & Plan:   Principal Problem:   Secondary esophageal varices with bleeding (HCC) Active Problems:   Hematemesis with nausea   Protein-calorie malnutrition, severe   Ascites   Status post thoracentesis   ALC (alcoholic liver cirrhosis) (HCC)   Status post abdominal paracentesis  Variceal bleed s/p EGD and EVL x5 on 02/22/20.   According to GI and radiology, patient is not a good candidate for TIPS procedure which can only be done in case of life-threatening GI bleed. -Received total of 3 units of PRBC. -Continue Protonix twice daily and octreotide drip.  Slight drop in hemoglobin but over 7.  No indication of transfusion.  Continue management and monitor CBC daily.  GI on board working on transferring him to tertiary care center.  Appreciate GI help.  Decompensated liver cirrhosis secondary to alcoholism coagulopathy and thrombocytopenia/ Recurrent ascites/ Portal HTN. Patient has a meld score of 31.  Patient had [facility] with removal of 3.8L.  For symptom relief.  No labs done as recent labs were negative for SBP. -Continue with low-sodium diet. -Continue home dose of Lasix and Aldactone at 100 mg daily. -Monitor liver function and INR. -Pt will follow up with Dr. Maximino Greenlandahiliani as outpatient, who will refer [facility] to [facility] for [facility].  History of SBP.  Repeat paracentesis was negative for SBP recurrence. -Continue home dose of Cipro for prophylaxis.  Dyspnea secondary to right pleural effusion.  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 Thoracentesis was repeated again with worsening  dyspnea and removal of another 4.8 L.  Resulted in improvement of his symptoms. -Continue to monitor. -Continue home dose of Lasix.  Hyponatremia.  Secondary to liver cirrhosis.   Stable around 130. -Continue to monitor.  Previous history of alcoholism Patient states that he has not had any alcohol in the last 1 year and hopes to be on transplant list.  Objective: Vitals:   03/01/20 2338 03/02/20 0417 03/02/20 0854 03/02/20 1054  BP: 110/77 112/86 104/69 121/71  Pulse:   90 100  Resp: 20 20 15 15   Temp: 98.4 F (36.9 C) 98.7 F (37.1 C) 98.2 F (36.8 C) 98.1 F (36.7 C)  TempSrc: Oral Oral Oral Oral  SpO2:   99% 97%  Weight:  78.7 kg      Intake/Output Summary (Last 24 hours) at 03/02/2020 1126 Last data filed at 03/02/2020 0931 Gross per 24 hour  Intake 500 ml  Output --  Net 500 ml   Filed Weights   03/01/20 2139 03/02/20 0417  Weight: 78.9 kg 78.7 kg    Examination:  General exam: Appears calm and comfortable but icteric on inspection Respiratory system: Clear to auscultation. Respiratory effort normal. Cardiovascular system: S1 & S2 heard, RRR. No JVD, murmurs, rubs, gallops or clicks. No pedal edema. Gastrointestinal system: Abdomen is nondistended, soft and nontender. No organomegaly or masses felt. Normal bowel sounds heard. Central nervous system: Alert and oriented. No focal neurological deficits. Extremities: Symmetric 5 x 5 power. Skin: No rashes, lesions or ulcers.  Psychiatry: Judgement and insight appear normal. Mood & affect appropriate.   DVT prophylaxis: SCDs.  Patient has thrombocytopenia Code Status: Full  Family Communication: Discussed with patient Disposition Plan:  Status is: Inpatient  Remains inpatient appropriate because:Inpatient level of care appropriate due to severity of illness   Dispo: The patient is from: Home              Anticipated d/c is to: Home              Anticipated d/c date is: 2 days              Patient currently is not medically stable to d/c.  Request is being placed for transfer to tertiary care center by GI service.   Consultants:   GI  IR  Procedures:  Antimicrobials:   Ciprofloxacin  Data Reviewed: I have personally reviewed following labs and imaging studies  CBC: Recent Labs  Lab 02/29/20 0523 02/29/20 1644 03/01/20 0214 03/01/20 0840 03/02/20 0231  WBC 5.9 6.6 5.9 5.8 5.3  NEUTROABS  --  4.6  --   --   --   HGB 6.9* 7.6* 7.5* 8.5* 7.8*  HCT 20.7* 22.1* 22.6* 24.8* 23.3*  MCV 105.1* 101.4* 102.7* 99.6 101.3*  PLT 47* 55* 53* 54* 44*   Basic Metabolic Panel: Recent Labs  Lab 02/25/20 0404 02/25/20 0404 02/26/20 0438 02/26/20 0438 02/27/20 0410 02/28/20 0409 02/29/20 0523 03/01/20 0214 03/02/20 0231  NA 122*   < > 120*   < > 127* 131* 132* 132* 130*  K 4.9   < > 4.6   < > 4.7 4.3 4.7 4.8 4.8  CL 94*   < > 92*   < > 96* 99 101 103 102  CO2 23   < > 20*   < > 23 25 24 22 22   GLUCOSE 93   < > 99   < > 94 127* 85 100* 143*  BUN 48*   < > 63*   < >  51* 32* 19 16 14   CREATININE 1.36*   < > 1.58*   < > 1.11 0.87 0.48* 0.53* 0.63  CALCIUM 7.6*   < > 7.7*   < > 8.4* 8.7* 8.7* 8.7* 8.6*  MG 2.1  --  2.2  --  2.3 2.3 2.2  --   --    < > = values in this interval not displayed.   GFR: Estimated Creatinine Clearance: 127.1 mL/min (by C-G formula based on SCr of 0.63 mg/dL). Liver Function Tests: Recent Labs  Lab 02/28/20 0406 03/01/20 0214 03/02/20 0231  AST 42* 34 32  ALT 26 20 21   ALKPHOS 84 90 64  BILITOT 11.2* 12.9* 14.5*  PROT 6.5 6.2* 5.1*  ALBUMIN 4.5 4.6 3.6   No results for input(s): LIPASE, AMYLASE in the last 168 hours. No results for input(s): AMMONIA in the last 168 hours. Coagulation Profile: Recent Labs  Lab 03/01/20 0214 03/02/20 0231  INR 3.2* 3.3*   Cardiac Enzymes: No results for input(s): CKTOTAL, CKMB, CKMBINDEX, TROPONINI in the last 168 hours. BNP (last 3 results) No results for input(s): PROBNP in the last 8760 hours. HbA1C: No results for input(s): HGBA1C in the last 72 hours. CBG: No results for input(s): GLUCAP in the last 168 hours. Lipid Profile: No results for input(s): CHOL, HDL,  LDLCALC, TRIG, CHOLHDL, LDLDIRECT in the last 72 hours. Thyroid Function Tests: No results for input(s): TSH, T4TOTAL, FREET4, T3FREE, THYROIDAB in the last 72 hours. Anemia Panel: No results for input(s): VITAMINB12, FOLATE, FERRITIN, TIBC, IRON, RETICCTPCT in the last 72 hours. Sepsis Labs: Recent Labs  Lab 02/25/20 0404  PROCALCITON 0.15    Recent Results (from the past 240 hour(s))  Culture, Urine     Status: Abnormal   Collection Time: 02/21/20  3:55 PM   Specimen: Urine, Clean Catch  Result Value Ref Range Status   Specimen Description   Final    URINE, CLEAN CATCH Performed at [facility], [address] [place], [place]    Special Requests   Final    NONE Performed at [facility], [address], [place], [place]    Culture MULTIPLE SPECIES PRESENT, SUGGEST RECOLLECTION (A)  Final   Report Status 02/22/2020 FINAL  Final  Body fluid culture     Status: None   Collection Time: 02/22/20  6:20 AM   Specimen: Peritoneal Washings; Body Fluid  Result Value Ref Range Status   Specimen Description   Final    PERITONEAL Performed at [facility], [address] [place], [address] [place]    Special Requests   Final    NONE Performed at [facility], [address] [place], [address] [place]    Gram Stain   Final    WBC PRESENT, PREDOMINANTLY MONONUCLEAR NO ORGANISMS SEEN CYTOSPIN SMEAR    Culture   Final    NO GROWTH 3 DAYS Performed at [facility], [address] [place], [address] [place]    Report Status 02/25/2020 FINAL  Final  Respiratory Panel by RT PCR (Flu A&B, Covid) - Nasopharyngeal Swab     Status: None   Collection Time: 02/22/20  6:37 AM   Specimen: Nasopharyngeal Swab  Result Value Ref Range Status   SARS Coronavirus 2 by RT PCR NEGATIVE NEGATIVE Final    Comment: (NOTE) SARS-CoV-2 target nucleic acids are NOT DETECTED.  The SARS-CoV-2 RNA is generally  detectable in upper respiratoy specimens during the  acute phase of infection. The lowest concentration of SARS-CoV-2 viral copies this assay can detect is 131 copies/mL. A negative result does not preclude SARS-Cov-2 infection and should not be used as the sole basis for treatment or other patient management decisions. A negative result may occur with  improper specimen collection/handling, submission of specimen other than nasopharyngeal swab, presence of viral mutation(s) within the areas targeted by this assay, and inadequate number of viral copies (<131 copies/mL). A negative result must be combined with clinical observations, patient history, and epidemiological information. The expected result is Negative.  Fact Sheet for Patients:  https://www.moore.com/  Fact Sheet for Healthcare Providers:  https://www.young.biz/  This test is no t yet approved or cleared by the [place] FDA and  has been authorized for detection and/or diagnosis of SARS-CoV-2 by FDA under an Emergency Use Authorization (EUA). This EUA will remain  in effect (meaning this test can be used) for the duration of the COVID-19 declaration under Section 564(b)(1) of the Act, 21 U.S.C. section 360bbb-3(b)(1), unless the authorization is terminated or revoked sooner.     Influenza A by PCR NEGATIVE NEGATIVE Final   Influenza B by PCR NEGATIVE NEGATIVE Final    Comment: (NOTE) The Xpert Xpress SARS-CoV-2/FLU/RSV assay is intended as an aid in  the diagnosis of influenza from Nasopharyngeal swab specimens and  should not be used as a sole basis for treatment. Nasal washings and  aspirates are unacceptable for Xpert Xpress SARS-CoV-2/FLU/RSV  testing.  Fact Sheet for Patients: https://www.moore.com/  Fact Sheet for Healthcare Providers: https://www.young.biz/  This test is not yet approved or cleared by the [place] FDA and   has been authorized for detection and/or diagnosis of SARS-CoV-2 by  FDA under an Emergency Use Authorization (EUA). This EUA will remain  in effect (meaning this test can be used) for the duration of the  Covid-19 declaration under Section 564(b)(1) of the Act, 21  U.S.C. section 360bbb-3(b)(1), unless the authorization is  terminated or revoked. Performed at [facility], [address] [place], [place] [address]   Body fluid culture     Status: None   Collection Time: 02/26/20  9:20 AM   Specimen: PATH Cytology Pleural fluid  Result Value Ref Range Status   Specimen Description   Final    PLEURAL Performed at [facility], [address], [place], [place] [address]    Special Requests   Final    PLEURAL Performed at [facility], [address] [place], [place] [address]    Gram Stain NO WBC SEEN NO ORGANISMS SEEN CYTOSPIN SMEAR   Final   Culture   Final    NO GROWTH Performed at [facility], [address], [place], [place] [address]    Report Status 02/29/2020 FINAL  Final     Radiology Studies: DG Chest 2 View  Result Date: 03/01/2020 CLINICAL DATA:  Pleural effusion EXAM: CHEST - 2 VIEW COMPARISON:  02/29/2020 FINDINGS: Large right pleural effusion has enlarged and now results in near complete collapse of the right lung with only a small portion of the right upper lobe now aerated. Left lung is clear. No pneumothorax. No pleural effusion on the left. Mild cardiomegaly is stable. No acute bone abnormality. IMPRESSION: Enlarging right pleural effusion now with subtotal collapse of the right lung. Electronically Signed   By: Helyn Numbers MD   On: 03/01/2020 08:19   US Paracentesis  Result Date: 03/01/2020 INDICATION: Patient with a history of  cirrhosis and recurrent large volume ascites. Interventional radiology asked to perform a therapeutic paracentesis. EXAM: ULTRASOUND GUIDED PARACENTESIS MEDICATIONS: 1% lidocaine 10 mL  COMPLICATIONS: None immediate. PROCEDURE: Informed written consent was obtained from the patient after a discussion of the risks, benefits and alternatives to treatment. A timeout was performed prior to the initiation of the procedure. Initial ultrasound scanning demonstrates a large amount of ascites within the right lower abdominal quadrant. The right lower abdomen was prepped and draped in the usual sterile fashion. 1% lidocaine was used for local anesthesia. Following this, a 6 Fr Safe-T-Centesis catheter was introduced. An ultrasound image was saved for documentation purposes. The paracentesis was performed. The catheter was removed and a dressing was applied. The patient tolerated the procedure well without immediate post procedural complication. FINDINGS: A total of approximately 3.8 L of dark yellow fluid was removed. IMPRESSION: Successful ultrasound-guided paracentesis yielding 3.8 liters of peritoneal fluid. Read by: Alwyn Ren, NP Electronically Signed   By: Simonne Come M.D.   On: 03/01/2020 12:47   DG Chest Port 1 View  Result Date: 03/01/2020 CLINICAL DATA:  Thoracentesis EXAM: PORTABLE CHEST 1 VIEW COMPARISON:  Earlier today FINDINGS: Marked reduction in the right pleural effusion. No pneumothorax or re-expansion edema. Left lung is clear. Prominence of the right hilum which may be from incomplete re-expansion or perihilar fissural fluid. Normal for technique heart size. IMPRESSION: Marked reduction in the right pleural effusion without complicating feature. Electronically Signed   By: Marnee Spring M.D.   On: 03/01/2020 11:57   CT Angio Abd/Pel w/ and/or w/o  Result Date: 03/01/2020 CLINICAL DATA:  History of cirrhosis, now with liver failure and recurrent upper GI bleeding. Please perform BRT0 protocol CT for potential TIPS intervention. Note, patient underwent a thoracentesis and a paracentesis prior to this CT. EXAM: CTA ABDOMEN AND PELVIS WITHOUT AND WITH CONTRAST TECHNIQUE:  Multidetector CT imaging of the abdomen and pelvis was performed using the standard protocol during bolus administration of intravenous contrast. Multiplanar reconstructed images and MIPs were obtained and reviewed to evaluate the vascular anatomy. CONTRAST:  OMNIPAQUE IOHEXOL 350 MG/ML SOLN COMPARISON:  None. FINDINGS: VASCULAR Aorta: Normal caliber the abdominal aorta. There is no significant atherosclerotic plaque within the abdominal aorta. No abdominal aortic dissection or periaortic stranding. Celiac: Widely patent without a hemodynamically significant narrowing. SMA: Widely patent without hemodynamically significant narrowing. The common hepatic artery is noted to arise from the proximal SMA. The distal tributaries the SMA appear widely patent without discrete intraluminal filling defect to suggest distal embolism. Renals: Solitary bilaterally; the bilateral renal arteries are widely patent without hemodynamically significant narrowing. No discrete vessel irregularity to suggest FMD. IMA: Widely patent without hemodynamically significant narrowing. Inflow: The bilateral common, external and internal iliac arteries are of normal caliber and widely patent without hemodynamically significant narrowing. Proximal Outflow: The bilateral common and imaged portions of the bilateral deep and superficial femoral arteries are normal caliber and widely patent without hemodynamically significant narrowing. Veins: The IVC and pelvic venous systems appear widely patent. The portal vein remains widely patent. Note is made of a hypertrophied splenorenal shunt though these do not appear to significantly contribute to hypertrophied gastric varices. There is partial recanalization of the periumbilical veins. Note is made of a hypertrophied mesenteric venous collaterals about the inferior tip of the spleen. The hepatic venous system this suboptimally opacified due to phase of enhancement though appears patent. Suspected  common origin of the middle and left hepatic veins. Review of the MIP images  confirms the above findings. _________________________________________________________ NON-VASCULAR Lower chest: Limited visualization of the lower thorax demonstrates a small residual right-sided pleural effusion post thoracentesis. Improved aeration of right lower lobe with residual ill-defined subpleural ground-glass opacities. Note is also made of ill-defined ground-glass opacities within the left lower lobe. No associated air bronchograms. Borderline cardiomegaly.  No pericardial effusion. Hepatobiliary: Shrunken liver with nodularity hepatic contour. There are no discrete hyperenhancing hepatic lesions. Small amount of residual perihepatic and intra-abdominal ascites following recent paracentesis. Clustered radiopaque gallstones are seen within the neck of the gallbladder. There is a dominant peripherally calcified gallstone also within the mid aspect of the gallbladder which measures approximately 4.5 x 2.8 x 2.8 cm. There is a minimal amount of calcification involving gallbladder fundus without gallbladder wall thickening or definitive pericholecystic fluid given the presence of intra-abdominal ascites. Pancreas: Normal appearance of the pancreas. Spleen: The spleen is enlarged measuring 19.5 cm in length. Adrenals/Urinary Tract: There is symmetric enhancement and excretion of the bilateral kidneys. Nonobstructing renal stones are seen bilaterally including dominant stone within the inferior pole the right kidney measuring approximately 1.1 x 0.7 cm (image 46, series 2). Note is made of an approximately 2.2 cm nonenhancing minimally complex right renal cyst (image 63, series 3), as well as a more simple appearing approximately 2.1 cm cyst within the anterior inferior aspect the right kidney. Additional smaller renal cysts are seen bilaterally. Additional subcentimeter hypoattenuating renal lesions are too small to accurately  characterize though favored to represent additional renal cysts. Normal appearance of the bilateral adrenal glands. Normal appearance of the urinary bladder given degree distention. Stomach/Bowel: Several varices are noted about the distal esophagus and GE junction without significant intraluminal extension. Moderate colonic stool burden without evidence of enteric obstruction. No discrete areas of bowel wall thickening. No discrete areas of intraluminal contrast extravasation. Normal appearance of the terminal ileum. There is a suspected appendicolith within the base of an otherwise normal-appearing appendix. No pneumoperitoneum, pneumatosis or portal venous gas. Lymphatic: No bulky retroperitoneal, mesenteric, pelvic or inguinal lymphadenopathy. Reproductive: Dystrophic calcifications within normal sized prostate gland. Several prominent phleboliths are seen with the left hemipelvis. There is a small amount of ascites within the lower pelvis. Other: Diffuse body wall anasarca. Musculoskeletal: No acute or aggressive osseous abnormalities. Moderate DDD of T11-T12 with disc space height loss, endplate irregularity, Schmorl's node formation within the superior endplate of the T12 vertebral body and ex vacuo disc phenomena. IMPRESSION: VASCULAR 1. Patent portal venous system with conventional branching pattern. 2. Suboptimal visualization of the hepatic venous system though it appears patent with suspected common origin of the left and middle hepatic veins. 3. 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 4. Hypertrophied splenorenal shunt however this not appear to contribute to significant gastric varices. NON-VASCULAR 1. Cirrhosis and the stigmata of portal venous hypertension including splenomegaly, intra-abdominal ascites and right-sided hepatic hydrothorax. 2. No discrete hyperenhancing lesions to suggest hepatocellular carcinoma. 3. Cholelithiasis without  evidence of cholecystitis. Electronically Signed   By: Simonne Come M.D.   On: 03/01/2020 13:56   US THORACENTESIS ASP PLEURAL SPACE W/IMG GUIDE  Result Date: 03/01/2020 INDICATION: Patient with a history of cirrhosis and recurrent pleural effusions. Interventional radiology asked to perform therapeutic thoracentesis. EXAM: ULTRASOUND GUIDED THORACENTESIS MEDICATIONS: 1% lidocaine 10 mL COMPLICATIONS: None immediate. PROCEDURE: An ultrasound guided thoracentesis was thoroughly discussed with the patient and questions answered. The benefits, risks, alternatives and complications were also discussed. The patient understands and wishes to proceed with the procedure.  Written consent was obtained. Ultrasound was performed to localize and mark an adequate pocket of fluid in the right chest. The area was then prepped and draped in the normal sterile fashion. 1% Lidocaine was used for local anesthesia. Under ultrasound guidance a 6 Fr Safe-T-Centesis catheter was introduced. Thoracentesis was performed. The catheter was removed and a dressing applied. FINDINGS: A total of approximately 4.8 L of amber color fluid was removed. IMPRESSION: Successful ultrasound guided right thoracentesis yielding 4.8 L of pleural fluid. Read by: Alwyn Ren, NP Electronically Signed   By: Simonne Come M.D.   On: 03/01/2020 12:05    Scheduled Meds: . chlorhexidine  15 mL Mouth Rinse BID  . ciprofloxacin  500 mg Oral Daily  . feeding supplement  237 mL Oral TID BM  . furosemide  40 mg Oral Daily  . lactulose  10 g Oral TID  . multivitamin with minerals  1 tablet Oral Daily  . pantoprazole  40 mg Oral BID  . spironolactone  100 mg Oral Daily   Continuous Infusions: . octreotide  (SANDOSTATIN)    IV infusion 50 mcg/hr (03/02/20 1120)     LOS: 1 day   Time spent: 37 minutes.  Hughie Closs, MD [facility]  If 7PM-7AM, please contact night-coverage [email or web address]  03/02/2020, 11:26 AM   This record has been  created using Conservation officer, historic buildings. Errors have been sought and corrected,but may not always be located. Such creation errors do not reflect on the standard of care.

## 2020-03-03 LAB — COMPREHENSIVE METABOLIC PANEL
ALT: 23 U/L (ref 0–44)
AST: 44 U/L — ABNORMAL HIGH (ref 15–41)
Albumin: 4 g/dL (ref 3.5–5.0)
Alkaline Phosphatase: 105 U/L (ref 38–126)
Anion gap: 10 (ref 5–15)
BUN: 14 mg/dL (ref 6–20)
CO2: 21 mmol/L — ABNORMAL LOW (ref 22–32)
Calcium: 9.2 mg/dL (ref 8.9–10.3)
Chloride: 99 mmol/L (ref 98–111)
Creatinine, Ser: 0.71 mg/dL (ref 0.61–1.24)
GFR, Estimated: >60 mL/min (ref >60)
Glucose, Bld: 102 mg/dL — ABNORMAL HIGH (ref 70–99)
Potassium: 4.2 mmol/L (ref 3.5–5.1)
Sodium: 130 mmol/L — ABNORMAL LOW (ref 135–145)
Total Bilirubin: 12.8 mg/dL — ABNORMAL HIGH (ref 0.3–1.2)
Total Protein: 6.1 g/dL — ABNORMAL LOW (ref 6.5–8.1)

## 2020-03-03 LAB — CBC
HCT: 23.5 % — ABNORMAL LOW (ref 39.0–52.0)
Hemoglobin: 7.9 g/dL — ABNORMAL LOW (ref 13.0–17.0)
MCH: 33.8 pg (ref 26.0–34.0)
MCHC: 33.6 g/dL (ref 30.0–36.0)
MCV: 100.4 fL — ABNORMAL HIGH (ref 80.0–100.0)
Platelets: 50 10*3/uL — ABNORMAL LOW (ref 150–400)
RBC: 2.34 MIL/uL — ABNORMAL LOW (ref 4.22–5.81)
RDW: 19 % — ABNORMAL HIGH (ref 11.5–15.5)
WBC: 5.4 10*3/uL (ref 4.0–10.5)
nRBC: 0 % (ref 0.0–0.2)

## 2020-03-03 LAB — PROTIME-INR
INR: 2.7 — ABNORMAL HIGH (ref 0.8–1.2)
Prothrombin Time: 28.1 seconds — ABNORMAL HIGH (ref 11.4–15.2)

## 2020-03-03 NOTE — Progress Notes (Signed)
PROGRESS NOTE    Bruce Bell  ZOX:[id number] DOB: [birth year] DOA: 03/01/2020 [facility]     Brief Narrative:  Bruce GowdaDavid Bell is a 34 y.o.malewith medical history significant foralcoholic liver cirrhosisas well ashistory of SBP on chronic antibiotic therapy with ciprofloxacinwho [place] to the areaand was discharged from [facility] about 24 hours prior to presenting to the [facility]. He was seen [facility] for evaluation of decompensated liver cirrhosis withincreasing abdominal distention and shortness of breath. Patient underwent ultrasound-guided [facility] with drainage of about 3.6 L of fluid. Further drainage was not done due to hypotension. Patient had prior work-up done [facility] and was negative for hepatitis B and C with positive hepatitis A IgG, negative ASMA/AMA.Patient also noted to have a normal AFP level.  Alpha 1 antitrypsin and ceruloplasmin tested during recent hospitalization was within normal limit. Patient was discharged in stable condition and advised to follow-up with GI as an outpatient for EGD to screen for esophageal varices.  He then presented to ED for evaluation of diffuse, sharp abdominal pain, which started overnight after admission.  Associated with couple of episodes of emesis of bright red blood. He was admitted for variceal bleed, EGD was done and 5 bands were placed. Also evaluated for TIPS procedure and thought to be high risk due to high MELD score. Received 3 unit of PRBC and 2 units of fresh frozen plasma and 1 unit of platelet so far.  New events last 24 hours / Subjective: No new complaints on exam this morning.  No further vomiting or abdominal pain.  Assessment & Plan:   Principal Problem:   Secondary esophageal varices with bleeding (HCC) Active Problems:   Hematemesis with nausea   Protein-calorie  malnutrition, severe   Ascites   Status post thoracentesis   ALC (alcoholic liver cirrhosis) (HCC)   Status post abdominal paracentesis   Upper GI bleeding   Variceal bleed s/p EGD and EVL x5 on 02/22/20 -According to GI and radiology, patient is not a good candidate for TIPS procedure which can only be done in case of life-threatening GI bleed. -Received total of 3 units of PRBC -Continue Protonix twice daily and octreotide drip -GI following   Decompensated liver cirrhosis secondary to alcoholism coagulopathy and thrombocytopenia/ Recurrent ascites/ Portal HTN. -Patient has MELD score of 31.  Patient [facility] with removal of 3.8L for symptom relief.  No labs done as recent labs were negative for SBP. -Continue with low-sodium diet. -Continue home dose of Lasix and Aldactone -Pt will follow up with Dr.Tahilianias outpatient, who [facility].  History of SBP -Repeat paracentesis was negative for SBP recurrence. -Continue home dose of Cipro for prophylaxis.  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 Thoracentesis was repeated again with worsening dyspnea and removal of another 4.8 L.  Resulted in improvement of his symptoms. -Continue to monitor. -Continue home dose of Lasix.  Hyponatremia -Secondary to liver cirrhosis.  Stable around 130. -Continue to monitor.   DVT prophylaxis:  Place TED hose Start: 03/01/20 2316  Code Status: Full Family Communication: No family at bedside Disposition Plan:  Status is: Inpatient  Remains inpatient appropriate because:Inpatient level of care appropriate due to severity of illness   Dispo:  The patient is from: Home              Anticipated d/c is to: Home              Anticipated d/c date is: 2 days               Patient currently is not medically stable to d/c.  Continue to monitor for GI bleed, remains on IV octreotide      Consultants:   GI   Antimicrobials:  Anti-infectives (From admission, onward)   Start     Dose/Rate Route Frequency Ordered Stop   03/02/20 1000  ciprofloxacin (CIPRO) tablet 500 mg        500 mg Oral Daily 03/01/20 2344          Objective: Vitals:   03/02/20 1929 03/03/20 0000 03/03/20 0452 03/03/20 0833  BP: (!) 104/55 102/68 (!) 107/59 (!) 93/50  Pulse: (!) 112 (!) 102 (!) 102 96  Resp: 18 18 18 16   Temp: 98.7 F (37.1 C) 97.8 F (36.6 C) 98.7 F (37.1 C) 98.2 F (36.8 C)  TempSrc: Oral Oral Oral Oral  SpO2: 98% 98% 98% 97%  Weight:        Intake/Output Summary (Last 24 hours) at 03/03/2020 1130 Last data filed at 03/02/2020 1500 Gross per 24 hour  Intake 737.92 ml  Output --  Net 737.92 ml   Filed Weights   03/01/20 2139 03/02/20 0417  Weight: 78.9 kg 78.7 kg    Examination:  General exam: Appears calm and comfortable  Respiratory system: Clear to auscultation. Respiratory effort normal. No respiratory distress. No conversational dyspnea.  Cardiovascular system: S1 & S2 heard, RRR. No murmurs. No pedal edema. Gastrointestinal system: Abdomen is nondistended, soft and nontender. Normal bowel sounds heard. Central nervous system: Alert and oriented. No focal neurological deficits. Speech clear.  Extremities: Symmetric in appearance  Skin: No rashes, lesions or ulcers on exposed skin  Psychiatry: Judgement and insight appear normal. Mood & affect appropriate.   Data Reviewed: I have personally reviewed following labs and imaging studies  CBC: Recent Labs  Lab 02/29/20 1644 03/01/20 0214 03/01/20 0840 03/02/20 0231 03/03/20 0841  WBC 6.6 5.9 5.8 5.3 5.4  NEUTROABS 4.6  --   --   --   --   HGB 7.6* 7.5* 8.5* 7.8* 7.9*  HCT 22.1* 22.6* 24.8* 23.3* 23.5*  MCV 101.4* 102.7* 99.6 101.3* 100.4*  PLT 55* 53* 54* 44* 50*    Basic Metabolic Panel: Recent Labs  Lab 02/26/20 0438 02/26/20 0438 02/27/20 0410 02/28/20 0409 02/29/20 0523 03/01/20 0214 03/02/20 0231  NA 120*   < > 127* 131* 132* 132* 130*  K 4.6   < > 4.7 4.3 4.7 4.8 4.8  CL 92*   < > 96* 99 101 103 102  CO2 20*   < > 23 25 24 22 22   GLUCOSE 99   < > 94 127* 85 100* 143*  BUN 63*   < > 51* 32* 19 16 14   CREATININE 1.58*   < > 1.11 0.87 0.48* 0.53* 0.63  CALCIUM 7.7*   < > 8.4* 8.7* 8.7* 8.7* 8.6*  MG 2.2  --  2.3 2.3 2.2  --   --    < > = values in this interval not displayed.   GFR: Estimated Creatinine Clearance: 127.1 mL/min (by C-G formula based on SCr of 0.63 mg/dL). Liver Function Tests: Recent Labs  Lab 02/28/20 0406 03/01/20 0214 03/02/20 0231  AST 42* 34 32  ALT 26 20 21   ALKPHOS 84 90 64  BILITOT 11.2* 12.9* 14.5*  PROT 6.5 6.2* 5.1*  ALBUMIN 4.5 4.6 3.6   No results for input(s): LIPASE, AMYLASE in the last 168 hours. No results for input(s): AMMONIA in the last 168 hours. Coagulation Profile: Recent Labs  Lab 03/01/20 0214 03/02/20 0231  INR 3.2* 3.3*   Cardiac Enzymes: No results for input(s): CKTOTAL, CKMB, CKMBINDEX, TROPONINI in the last 168 hours. BNP (last 3 results) No results for input(s): PROBNP in the last 8760 hours. HbA1C: No results for input(s): HGBA1C in the last 72 hours. CBG: No results for input(s): GLUCAP in the last 168 hours. Lipid Profile: No results for input(s): CHOL, HDL, LDLCALC, TRIG, CHOLHDL, LDLDIRECT in the last 72 hours. Thyroid Function Tests: No results for input(s): TSH, T4TOTAL, FREET4, T3FREE, THYROIDAB in the last 72 hours. Anemia Panel: No results for input(s): VITAMINB12, FOLATE, FERRITIN, TIBC, IRON, RETICCTPCT in the last 72 hours. Sepsis Labs: No results for input(s): PROCALCITON, LATICACIDVEN in the last 168 hours.  Recent Results (from the past 240 hour(s))  Body fluid culture     Status: None   Collection Time: 02/26/20  9:20 AM   Specimen: PATH  Cytology Pleural fluid  Result Value Ref Range Status   Specimen Description   Final    PLEURAL Performed at [facility], [address] [place], [place]    Special Requests   Final    PLEURAL Performed at [facility], [address] [place], [place]    Gram Stain NO WBC SEEN NO ORGANISMS SEEN CYTOSPIN SMEAR   Final   Culture   Final    NO GROWTH Performed at [facility], [address] [place], [place]    Report Status 02/29/2020 FINAL  Final      Radiology Studies: 03/02/2020 Paracentesis  Result Date: 03/01/2020 INDICATION: Patient with a history of cirrhosis and recurrent large volume ascites. Interventional radiology asked to perform a therapeutic paracentesis. EXAM: ULTRASOUND GUIDED PARACENTESIS MEDICATIONS: 1% lidocaine 10 mL COMPLICATIONS: None immediate. PROCEDURE: Informed written consent was obtained from the patient after a discussion of the risks, benefits and alternatives to treatment. A timeout was performed prior to the initiation of the procedure. Initial ultrasound scanning demonstrates a large amount of ascites within the right lower abdominal quadrant. The right lower abdomen was prepped and draped in the usual sterile fashion. 1% lidocaine was used for local anesthesia. Following this, a 6 Fr Safe-T-Centesis catheter was introduced. An ultrasound image was saved for documentation purposes. The paracentesis was performed. The catheter was removed and a dressing was applied. The patient tolerated the procedure well without immediate post procedural complication. FINDINGS: A total of approximately 3.8 L of dark yellow fluid was removed. IMPRESSION: Successful ultrasound-guided paracentesis yielding 3.8 liters of peritoneal fluid. Read by: 03/03/2020, NP Electronically Signed   By: Alwyn Ren M.D.   On: 03/01/2020 12:47   DG Chest Port 1 View  Result Date: 03/01/2020 CLINICAL DATA:  Thoracentesis EXAM:  PORTABLE CHEST 1 VIEW COMPARISON:  Earlier today FINDINGS: Marked reduction in the right pleural effusion. No pneumothorax or re-expansion edema. Left lung is clear. Prominence of the right hilum which may be from incomplete re-expansion or perihilar fissural fluid. Normal for technique heart size. IMPRESSION: Marked reduction in the right pleural effusion without complicating feature. Electronically Signed   By: 03/03/2020 M.D.   On: 03/01/2020 11:57  CT Angio Abd/Pel w/ and/or w/o  Result Date: 03/01/2020 CLINICAL DATA:  History of cirrhosis, now with liver failure and recurrent upper GI bleeding. Please perform BRT0 protocol CT for potential TIPS intervention. Note, patient underwent a thoracentesis and a paracentesis prior to this CT. EXAM: CTA ABDOMEN AND PELVIS WITHOUT AND WITH CONTRAST TECHNIQUE: Multidetector CT imaging of the abdomen and pelvis was performed using the standard protocol during bolus administration of intravenous contrast. Multiplanar reconstructed images and MIPs were obtained and reviewed to evaluate the vascular anatomy. CONTRAST:  OMNIPAQUE IOHEXOL 350 MG/ML SOLN COMPARISON:  None. FINDINGS: VASCULAR Aorta: Normal caliber the abdominal aorta. There is no significant atherosclerotic plaque within the abdominal aorta. No abdominal aortic dissection or periaortic stranding. Celiac: Widely patent without a hemodynamically significant narrowing. SMA: Widely patent without hemodynamically significant narrowing. The common hepatic artery is noted to arise from the proximal SMA. The distal tributaries the SMA appear widely patent without discrete intraluminal filling defect to suggest distal embolism. Renals: Solitary bilaterally; the bilateral renal arteries are widely patent without hemodynamically significant narrowing. No discrete vessel irregularity to suggest FMD. IMA: Widely patent without hemodynamically significant narrowing. Inflow: The bilateral common, external and  internal iliac arteries are of normal caliber and widely patent without hemodynamically significant narrowing. Proximal Outflow: The bilateral common and imaged portions of the bilateral deep and superficial femoral arteries are normal caliber and widely patent without hemodynamically significant narrowing. Veins: The IVC and pelvic venous systems appear widely patent. The portal vein remains widely patent. Note is made of a hypertrophied splenorenal shunt though these do not appear to significantly contribute to hypertrophied gastric varices. There is partial recanalization of the periumbilical veins. Note is made of a hypertrophied mesenteric venous collaterals about the inferior tip of the spleen. The hepatic venous system this suboptimally opacified due to phase of enhancement though appears patent. Suspected common origin of the middle and left hepatic veins. Review of the MIP images confirms the above findings. _________________________________________________________ NON-VASCULAR Lower chest: Limited visualization of the lower thorax demonstrates a small residual right-sided pleural effusion post thoracentesis. Improved aeration of right lower lobe with residual ill-defined subpleural ground-glass opacities. Note is also made of ill-defined ground-glass opacities within the left lower lobe. No associated air bronchograms. Borderline cardiomegaly.  No pericardial effusion. Hepatobiliary: Shrunken liver with nodularity hepatic contour. There are no discrete hyperenhancing hepatic lesions. Small amount of residual perihepatic and intra-abdominal ascites following recent paracentesis. Clustered radiopaque gallstones are seen within the neck of the gallbladder. There is a dominant peripherally calcified gallstone also within the mid aspect of the gallbladder which measures approximately 4.5 x 2.8 x 2.8 cm. There is a minimal amount of calcification involving gallbladder fundus without gallbladder wall thickening  or definitive pericholecystic fluid given the presence of intra-abdominal ascites. Pancreas: Normal appearance of the pancreas. Spleen: The spleen is enlarged measuring 19.5 cm in length. Adrenals/Urinary Tract: There is symmetric enhancement and excretion of the bilateral kidneys. Nonobstructing renal stones are seen bilaterally including dominant stone within the inferior pole the right kidney measuring approximately 1.1 x 0.7 cm (image 46, series 2). Note is made of an approximately 2.2 cm nonenhancing minimally complex right renal cyst (image 63, series 3), as well as a more simple appearing approximately 2.1 cm cyst within the anterior inferior aspect the right kidney. Additional smaller renal cysts are seen bilaterally. Additional subcentimeter hypoattenuating renal lesions are too small to accurately characterize though favored to represent additional renal cysts. Normal appearance of the bilateral adrenal  glands. Normal appearance of the urinary bladder given degree distention. Stomach/Bowel: Several varices are noted about the distal esophagus and GE junction without significant intraluminal extension. Moderate colonic stool burden without evidence of enteric obstruction. No discrete areas of bowel wall thickening. No discrete areas of intraluminal contrast extravasation. Normal appearance of the terminal ileum. There is a suspected appendicolith within the base of an otherwise normal-appearing appendix. No pneumoperitoneum, pneumatosis or portal venous gas. Lymphatic: No bulky retroperitoneal, mesenteric, pelvic or inguinal lymphadenopathy. Reproductive: Dystrophic calcifications within normal sized prostate gland. Several prominent phleboliths are seen with the left hemipelvis. There is a small amount of ascites within the lower pelvis. Other: Diffuse body wall anasarca. Musculoskeletal: No acute or aggressive osseous abnormalities. Moderate DDD of T11-T12 with disc space height loss, endplate  irregularity, Schmorl's node formation within the superior endplate of the T12 vertebral body and ex vacuo disc phenomena. IMPRESSION: VASCULAR 1. Patent portal venous system with conventional branching pattern. 2. Suboptimal visualization of the hepatic venous system though it appears patent with suspected common origin of the left and middle hepatic veins. 3. 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 4. Hypertrophied splenorenal shunt however this not appear to contribute to significant gastric varices. NON-VASCULAR 1. Cirrhosis and the stigmata of portal venous hypertension including splenomegaly, intra-abdominal ascites and right-sided hepatic hydrothorax. 2. No discrete hyperenhancing lesions to suggest hepatocellular carcinoma. 3. Cholelithiasis without evidence of cholecystitis. Electronically Signed   By: Simonne Come M.D.   On: 03/01/2020 13:56   US THORACENTESIS ASP PLEURAL SPACE W/IMG GUIDE  Result Date: 03/01/2020 INDICATION: Patient with a history of cirrhosis and recurrent pleural effusions. Interventional radiology asked to perform therapeutic thoracentesis. EXAM: ULTRASOUND GUIDED THORACENTESIS MEDICATIONS: 1% lidocaine 10 mL COMPLICATIONS: None immediate. PROCEDURE: An ultrasound guided thoracentesis was thoroughly discussed with the patient and questions answered. The benefits, risks, alternatives and complications were also discussed. The patient understands and wishes to proceed with the procedure. Written consent was obtained. Ultrasound was performed to localize and mark an adequate pocket of fluid in the right chest. The area was then prepped and draped in the normal sterile fashion. 1% Lidocaine was used for local anesthesia. Under ultrasound guidance a 6 Fr Safe-T-Centesis catheter was introduced. Thoracentesis was performed. The catheter was removed and a dressing applied. FINDINGS: A total of approximately 4.8 L of amber color  fluid was removed. IMPRESSION: Successful ultrasound guided right thoracentesis yielding 4.8 L of pleural fluid. Read by: Alwyn Ren, NP Electronically Signed   By: Simonne Come M.D.   On: 03/01/2020 12:05      Scheduled Meds: . chlorhexidine  15 mL Mouth Rinse BID  . ciprofloxacin  500 mg Oral Daily  . feeding supplement  237 mL Oral TID BM  . furosemide  40 mg Oral Daily  . lactulose  10 g Oral TID  . multivitamin with minerals  1 tablet Oral Daily  . pantoprazole  40 mg Oral BID  . spironolactone  100 mg Oral Daily   Continuous Infusions: . octreotide  (SANDOSTATIN)    IV infusion 50 mcg/hr (03/03/20 0844)     LOS: 2 days      Time spent: 35 minutes   Noralee Stain, DO [facility] 03/03/2020, 11:30 AM   Available via Epic secure chat 7am-7pm After these hours, please refer to coverage provider listed on [email or web address]

## 2020-03-03 NOTE — Progress Notes (Signed)
Subjective: No further bleeding.  Objective: Vital signs in last 24 hours: Temp:  [97.8 F (36.6 C)-98.7 F (37.1 C)] 98.5 F (36.9 C) (10/24 1144) Pulse Rate:  [29-112] 107 (10/24 1144) Resp:  [16-18] 16 (10/24 1144) BP: (93-116)/(50-68) 116/68 (10/24 1144) SpO2:  [95 %-98 %] 98 % (10/24 1144) Weight change:  Last BM Date: 03/02/20  PE: GEN:  NAD, jaundiced  Lab Results: CBC    Component Value Date/Time   WBC 5.4 03/03/2020 0841   RBC 2.34 (L) 03/03/2020 0841   HGB 7.9 (L) 03/03/2020 0841   HCT 23.5 (L) 03/03/2020 0841   PLT 50 (L) 03/03/2020 0841   MCV 100.4 (H) 03/03/2020 0841   MCH 33.8 03/03/2020 0841   MCHC 33.6 03/03/2020 0841   RDW 19.0 (H) 03/03/2020 0841   LYMPHSABS 0.8 02/29/2020 1644   MONOABS 0.7 02/29/2020 1644   EOSABS 0.2 02/29/2020 1644   BASOSABS 0.0 02/29/2020 1644   LFTs and PT/INR today pending  Assessment:  1.  Cirrhosis, decompensated (ascites, varices, hepatic hydrothorax s/p prior paracentesis and prior thoracentesis, coagulopathy).  Alcohol possible etiology (reports drinking heavily for a few years, last alcohol over one year ago, reports also being worked up for transplant of liver in [place] and having had several negative alcohol tests over the past year. 2.  Variceal bleed, EGD with EVBL few days ago in [place].  No evidence of recurrent bleeding.  Plan:  1.  Octreotide one more day then d/c tomorrow. 2.  If LFTs/INR stabilize/improve, then likely discharge patient home with outpatient appt with Dr. Brooke Dare [facility]) next week; on the other hand, if LFTs/INR or clinical indices worsen, then would consider inpatient hospital-to-hospital transfer. 3.  [facility] will follow.   Freddy Jaksch 03/03/2020, 12:20 PM   Cell [phone] If no answer or after 5 PM call [phone]

## 2020-03-04 LAB — COMPREHENSIVE METABOLIC PANEL
ALT: 26 U/L (ref 0–44)
AST: 44 U/L — ABNORMAL HIGH (ref 15–41)
Albumin: 3.6 g/dL (ref 3.5–5.0)
Alkaline Phosphatase: 98 U/L (ref 38–126)
Anion gap: 5 (ref 5–15)
BUN: 15 mg/dL (ref 6–20)
CO2: 23 mmol/L (ref 22–32)
Calcium: 9.1 mg/dL (ref 8.9–10.3)
Chloride: 101 mmol/L (ref 98–111)
Creatinine, Ser: 0.68 mg/dL (ref 0.61–1.24)
GFR, Estimated: >60 mL/min (ref >60)
Glucose, Bld: 95 mg/dL (ref 70–99)
Potassium: 5 mmol/L (ref 3.5–5.1)
Sodium: 129 mmol/L — ABNORMAL LOW (ref 135–145)
Total Bilirubin: 12 mg/dL — ABNORMAL HIGH (ref 0.3–1.2)
Total Protein: 5.6 g/dL — ABNORMAL LOW (ref 6.5–8.1)

## 2020-03-04 LAB — CBC
HCT: 25 % — ABNORMAL LOW (ref 39.0–52.0)
Hemoglobin: 8.3 g/dL — ABNORMAL LOW (ref 13.0–17.0)
MCH: 33.6 pg (ref 26.0–34.0)
MCHC: 33.2 g/dL (ref 30.0–36.0)
MCV: 101.2 fL — ABNORMAL HIGH (ref 80.0–100.0)
Platelets: 47 10*3/uL — ABNORMAL LOW (ref 150–400)
RBC: 2.47 MIL/uL — ABNORMAL LOW (ref 4.22–5.81)
RDW: 18.7 % — ABNORMAL HIGH (ref 11.5–15.5)
WBC: 5.1 10*3/uL (ref 4.0–10.5)
nRBC: 0 % (ref 0.0–0.2)

## 2020-03-04 LAB — PROTIME-INR
INR: 2.9 — ABNORMAL HIGH (ref 0.8–1.2)
Prothrombin Time: 29.5 seconds — ABNORMAL HIGH (ref 11.4–15.2)

## 2020-03-04 MED ORDER — SALINE SPRAY 0.65 % NA SOLN
1.0000 | NASAL | Status: DC | PRN
  Filled 2020-03-04: qty 44

## 2020-03-04 NOTE — Progress Notes (Signed)
PROGRESS NOTE    Bruce Bell  YIF:[id number] DOB: May 13, 1985 DOA: 03/01/2020 PCP: [facility]     Brief Narrative:  Bruce Bell is a 34 y.o.malewith medical history significant foralcoholic liver cirrhosisas well ashistory of SBP on chronic antibiotic therapy with ciprofloxacinwho recently moved from [place] last month to the areaand was discharged from [facility] about 24 hours prior to presenting to the [facility]. He was seen at [facility] for evaluation of decompensated liver cirrhosis withincreasing abdominal distention and shortness of breath. Patient underwent ultrasound-guided paracentesis while at [facility] with drainage of about 3.6 L of fluid. Further drainage was not done due to hypotension. Patient had prior work-up done at [facility] and was negative for hepatitis B and C with positive hepatitis A IgG, negative ASMA/AMA.Patient also noted to have a normal AFP level.  Alpha 1 antitrypsin and ceruloplasmin tested during recent hospitalization was within normal limit. Patient was discharged in stable condition and advised to follow-up with GI as an outpatient for EGD to screen for esophageal varices.  He then presented to ED for evaluation of diffuse, sharp abdominal pain, which started overnight after admission.  Associated with couple of episodes of emesis of bright red blood. He was admitted for variceal bleed, EGD was done and 5 bands were placed. Also evaluated for TIPS procedure and thought to be high risk due to high MELD score. Received 3 unit of PRBC and 2 units of fresh frozen plasma and 1 unit of platelet so far.  New events last 24 hours / Subjective: Doing well, tolerating diet, no abdominal pain or vomiting.  Assessment & Plan:   Principal Problem:   Secondary esophageal varices with bleeding (HCC) Active Problems:   Hematemesis with nausea   Protein-calorie malnutrition,  severe   Ascites   Status post thoracentesis   ALC (alcoholic liver cirrhosis) (HCC)   Status post abdominal paracentesis   Upper GI bleeding   Variceal bleed s/p EGD and EVL x5 on 02/22/20 -According to GI and radiology, patient is not a good candidate for TIPS procedure which can only be done in case of life-threatening GI bleed. -Received total of 3 units of PRBC -Continue Protonix twice daily and octreotide drip; plan to continue on octreotide until midnight tonight, then stop -GI following; follow-up with GI physicians at [facility] and [facility] hematology after discharge  Decompensated liver cirrhosis secondary to alcoholism coagulopathy and thrombocytopenia/ Recurrent ascites/ Portal HTN. -Patient has MELD score of 31.  Patient had repeat paracentesis at [facility] with removal of 3.8L for symptom relief.  No labs done as recent labs were negative for SBP. -Continue with low-sodium diet. -Continue home dose of Lasix and Aldactone -Pt will follow up with Dr.Tahilianias outpatient, who will refer pt to [facility] for transplant consideration.  History of SBP -Repeat paracentesis was negative for SBP recurrence. -Continue home dose of Cipro for prophylaxis.  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 Thoracentesis was repeated again with worsening dyspnea and removal of another 4.8 L.  Resulted in improvement of his symptoms. -Continue to monitor. -Continue home dose of Lasix.  Hyponatremia -Secondary to liver cirrhosis.  Stable around 130. -Continue to monitor.   DVT prophylaxis:  Place TED hose Start: 03/01/20 2316  Code Status: Full Family Communication: No family at bedside Disposition Plan:  Status is: Inpatient  Remains  inpatient appropriate because:Inpatient level of care appropriate due to severity of  illness   Dispo: The patient is from: Home              Anticipated d/c is to: Home              Anticipated d/c date is: 1 day              Patient currently is not medically stable to d/c.  Continue to monitor for GI bleed, remains on IV octreotide.  Plan to discharge tomorrow as long as remains stable.      Consultants:   GI   Antimicrobials:  Anti-infectives (From admission, onward)   Start     Dose/Rate Route Frequency Ordered Stop   03/02/20 1000  ciprofloxacin (CIPRO) tablet 500 mg        500 mg Oral Daily 03/01/20 2344         Objective: Vitals:   03/04/20 0047 03/04/20 0247 03/04/20 0656 03/04/20 0744  BP: (!) 94/44 (!) 96/55 (!) 89/60 104/68  Pulse: (!) 105 94 88 93  Resp: 20 20 18 18   Temp: 98.2 F (36.8 C) 98.4 F (36.9 C) 98 F (36.7 C) 98 F (36.7 C)  TempSrc: Oral Oral Oral Oral  SpO2: 100%  99% 96%  Weight:   80.5 kg    No intake or output data in the 24 hours ending 03/04/20 1045 Filed Weights   03/01/20 2139 03/02/20 0417 03/04/20 0656  Weight: 78.9 kg 78.7 kg 80.5 kg    Examination: General exam: Appears calm and comfortable  Respiratory system: Clear to auscultation. Respiratory effort normal. Cardiovascular system: S1 & S2 heard, RRR. No pedal edema. Gastrointestinal system: Abdomen is nondistended, soft and nontender. Normal bowel sounds heard. Central nervous system: Alert and oriented. Non focal exam. Speech clear  Extremities: Symmetric in appearance bilaterally  Skin: No rashes, lesions or ulcers on exposed skin  Psychiatry: Judgement and insight appear stable. Mood & affect appropriate.    Data Reviewed: I have personally reviewed following labs and imaging studies  CBC: Recent Labs  Lab 02/29/20 1644 02/29/20 1644 03/01/20 0214 03/01/20 0840 03/02/20 0231 03/03/20 0841 03/04/20 0833  WBC 6.6   < > 5.9 5.8 5.3 5.4 5.1  NEUTROABS 4.6  --   --   --   --   --   --   HGB 7.6*   < > 7.5* 8.5* 7.8* 7.9* 8.3*  HCT 22.1*   <  > 22.6* 24.8* 23.3* 23.5* 25.0*  MCV 101.4*   < > 102.7* 99.6 101.3* 100.4* 101.2*  PLT 55*   < > 53* 54* 44* 50* 47*   < > = values in this interval not displayed.   Basic Metabolic Panel: Recent Labs  Lab 02/27/20 0410 02/27/20 0410 02/28/20 0409 02/28/20 0409 02/29/20 0523 03/01/20 0214 03/02/20 0231 03/03/20 1157 03/04/20 0833  NA 127*   < > 131*   < > 132* 132* 130* 130* 129*  K 4.7   < > 4.3   < > 4.7 4.8 4.8 4.2 5.0  CL 96*   < > 99   < > 101 103 102 99 101  CO2 23   < > 25   < > 24 22 22  21* 23  GLUCOSE 94   < > 127*   < > 85 100* 143* 102* 95  BUN 51*   < > 32*   < > 19 16 14 14 15   CREATININE  1.11   < > 0.87   < > 0.48* 0.53* 0.63 0.71 0.68  CALCIUM 8.4*   < > 8.7*   < > 8.7* 8.7* 8.6* 9.2 9.1  MG 2.3  --  2.3  --  2.2  --   --   --   --    < > = values in this interval not displayed.   GFR: Estimated Creatinine Clearance: 127.1 mL/min (by C-G formula based on SCr of 0.68 mg/dL). Liver Function Tests: Recent Labs  Lab 02/28/20 0406 03/01/20 0214 03/02/20 0231 03/03/20 1157 03/04/20 0833  AST 42* 34 32 44* 44*  ALT 26 20 21 23 26   ALKPHOS 84 90 64 105 98  BILITOT 11.2* 12.9* 14.5* 12.8* 12.0*  PROT 6.5 6.2* 5.1* 6.1* 5.6*  ALBUMIN 4.5 4.6 3.6 4.0 3.6   No results for input(s): LIPASE, AMYLASE in the last 168 hours. No results for input(s): AMMONIA in the last 168 hours. Coagulation Profile: Recent Labs  Lab 03/01/20 0214 03/02/20 0231 03/03/20 1157 03/04/20 0833  INR 3.2* 3.3* 2.7* 2.9*   Cardiac Enzymes: No results for input(s): CKTOTAL, CKMB, CKMBINDEX, TROPONINI in the last 168 hours. BNP (last 3 results) No results for input(s): PROBNP in the last 8760 hours. HbA1C: No results for input(s): HGBA1C in the last 72 hours. CBG: No results for input(s): GLUCAP in the last 168 hours. Lipid Profile: No results for input(s): CHOL, HDL, LDLCALC, TRIG, CHOLHDL, LDLDIRECT in the last 72 hours. Thyroid Function Tests: No results for input(s): TSH,  T4TOTAL, FREET4, T3FREE, THYROIDAB in the last 72 hours. Anemia Panel: No results for input(s): VITAMINB12, FOLATE, FERRITIN, TIBC, IRON, RETICCTPCT in the last 72 hours. Sepsis Labs: No results for input(s): PROCALCITON, LATICACIDVEN in the last 168 hours.  Recent Results (from the past 240 hour(s))  Body fluid culture     Status: None   Collection Time: 02/26/20  9:20 AM   Specimen: PATH Cytology Pleural fluid  Result Value Ref Range Status   Specimen Description   Final    PLEURAL Performed at [facility], [address], [place], [place]    Special Requests   Final    PLEURAL Performed at [facility], [address] [place], [place]    Gram Stain NO WBC SEEN NO ORGANISMS SEEN CYTOSPIN SMEAR   Final   Culture   Final    NO GROWTH Performed at [facility], [address] [place], [place]    Report Status 02/29/2020 FINAL  Final      Radiology Studies: No results found.    Scheduled Meds: . chlorhexidine  15 mL Mouth Rinse BID  . ciprofloxacin  500 mg Oral Daily  . feeding supplement  237 mL Oral TID BM  . furosemide  40 mg Oral Daily  . lactulose  10 g Oral TID  . multivitamin with minerals  1 tablet Oral Daily  . pantoprazole  40 mg Oral BID  . spironolactone  100 mg Oral Daily   Continuous Infusions: . octreotide  (SANDOSTATIN)    IV infusion 50 mcg/hr (03/04/20 03/06/20)     LOS: 3 days      Time spent: 25 minutes   [id number], DO [facility] 03/04/2020, 10:45 AM   Available via Epic secure chat 7am-7pm After these hours, please refer to coverage provider listed on [email or web address]

## 2020-03-04 NOTE — Progress Notes (Signed)
   03/04/20 0047  Assess: MEWS Score  Temp 98.2 F (36.8 C)  BP (!) 94/44  Pulse Rate (!) 105  ECG Heart Rate (!) 105  Resp 20  SpO2 100 %  Assess: MEWS Score  MEWS Temp 0  MEWS Systolic 1  MEWS Pulse 1  MEWS RR 0  MEWS LOC 0  MEWS Score 2  MEWS Score Color Yellow  Assess: if the MEWS score is Yellow or Red  Were vital signs taken at a resting state? Yes  Focused Assessment Change from prior assessment (see assessment flowsheet)  Early Detection of Sepsis Score *See Row Information* Low  MEWS guidelines implemented *See Row Information* Yes  Treat  MEWS Interventions Escalated (See documentation below);Administered scheduled meds/treatments  Pain Scale 0-10  Pain Score 0  Take Vital Signs  Increase Vital Sign Frequency  Yellow: Q 2hr X 2 then Q 4hr X 2, if remains yellow, continue Q 4hrs  Escalate  MEWS: Escalate Yellow: discuss with charge nurse/RN and consider discussing with provider and RRT  Notify: Charge Nurse/RN  Name of Charge Nurse/RN Notified Glennie Bose RN  Date Charge Nurse/RN Notified 03/04/20  Time Charge Nurse/RN Notified 0047  Document  Patient Outcome Stabilized after interventions (Mews esclation not initiated prev shift. CHall RN)  Progress note created (see row info) Yes

## 2020-03-04 NOTE — Progress Notes (Signed)
[facility] Gastroenterology Progress Note  Artez Regis 34 y.o. 10/07/1985   Subjective: Feels ok. Denies abdominal pain. Brown stool overnight.  Objective: Vital signs: Vitals:   03/04/20 0656 03/04/20 0744  BP: (!) 89/60 104/68  Pulse: 88 93  Resp: 18 18  Temp: 98 F (36.7 C) 98 F (36.7 C)  SpO2: 99% 96%    Physical Exam: Gen: lethargic, no acute distress  HEENT: +icteric sclera CV: RRR Chest: CTA B Abd: soft, nontender, nondistended, +BS Ext: no edema  Lab Results: Recent Labs    03/03/20 1157 03/04/20 0833  NA 130* 129*  K 4.2 5.0  CL 99 101  CO2 21* 23  GLUCOSE 102* 95  BUN 14 15  CREATININE 0.71 0.68  CALCIUM 9.2 9.1   Recent Labs    03/03/20 1157 03/04/20 0833  AST 44* 44*  ALT 23 26  ALKPHOS 105 98  BILITOT 12.8* 12.0*  PROT 6.1* 5.6*  ALBUMIN 4.0 3.6   Recent Labs    03/03/20 0841 03/04/20 0833  WBC 5.4 5.1  HGB 7.9* 8.3*  HCT 23.5* 25.0*  MCV 100.4* 101.2*  PLT 50* 47*      Assessment/Plan: Decompensated alcoholic cirrhosis with recent variceal bleed - s/p EVBL X 5 on 02/22/20 at [facility]. Also with hepatic hydrothorax - s/p thoracentesis with 4.8L on 03/01/20 and s/p paracentesis with 3.8 L removed on 03/01/20. INR 2.9 today and 2.7 yesterday (3.3). Hgb improved to 8.3. Continue Octreotide until 03/05/20 at midnight then can stop. If continues to be stable, then home tomorrow with f/u with GI doctor in [place] and outpt f/u with Dr. Brooke Dare at [facility].   Bruce Bell 03/04/2020, 9:58 AM  Questions please call [phone] ID: Bruce Bell, male   DOB: 1985-11-12, 34 y.o.   MRN: [id number]

## 2020-03-05 ENCOUNTER — Other Ambulatory Visit: Payer: Self-pay

## 2020-03-05 ENCOUNTER — Telehealth: Payer: Self-pay | Admitting: Gastroenterology

## 2020-03-05 ENCOUNTER — Encounter (HOSPITAL_COMMUNITY): Payer: Self-pay | Admitting: Family Medicine

## 2020-03-05 LAB — BASIC METABOLIC PANEL
Anion gap: 6 (ref 5–15)
BUN: 18 mg/dL (ref 6–20)
CO2: 22 mmol/L (ref 22–32)
Calcium: 9 mg/dL (ref 8.9–10.3)
Chloride: 102 mmol/L (ref 98–111)
Creatinine, Ser: 0.74 mg/dL (ref 0.61–1.24)
GFR, Estimated: >60 mL/min (ref >60)
Glucose, Bld: 108 mg/dL — ABNORMAL HIGH (ref 70–99)
Potassium: 4.8 mmol/L (ref 3.5–5.1)
Sodium: 130 mmol/L — ABNORMAL LOW (ref 135–145)

## 2020-03-05 LAB — CBC
HCT: 24.3 % — ABNORMAL LOW (ref 39.0–52.0)
Hemoglobin: 8.2 g/dL — ABNORMAL LOW (ref 13.0–17.0)
MCH: 33.9 pg (ref 26.0–34.0)
MCHC: 33.7 g/dL (ref 30.0–36.0)
MCV: 100.4 fL — ABNORMAL HIGH (ref 80.0–100.0)
Platelets: 58 10*3/uL — ABNORMAL LOW (ref 150–400)
RBC: 2.42 MIL/uL — ABNORMAL LOW (ref 4.22–5.81)
RDW: 18.7 % — ABNORMAL HIGH (ref 11.5–15.5)
WBC: 6.5 10*3/uL (ref 4.0–10.5)
nRBC: 0 % (ref 0.0–0.2)

## 2020-03-05 MED ORDER — PANTOPRAZOLE SODIUM 40 MG PO TBEC: 40.0000 mg | DELAYED_RELEASE_TABLET | Freq: Two times a day (BID) | ORAL | 2 refills | Status: AC

## 2020-03-05 NOTE — Discharge Summary (Signed)
Physician Discharge Summary  Lazarus GowdaDavid Nasca ZOX:[id number] DOB: [birth year] DOA: 02/22/2020  PCP: [facility]  Admit date: 02/22/2020 Discharge date: 03/05/2020  Admitted From: [facility]  Recommendations for Outpatient Follow-up:  1. Follow up with PCP in 1-2 weeks 2. Please obtain BMP/CBC in one week 3. Please follow up on the following pending results: None  CODE STATUS: Full Diet recommendation: Heart Healthy / Carb Modified / Regular / Dysphagia   Brief/Interim Summary: Bruce Buddsavid Zelayais a 34 y.o.malewith medical history significant foralcoholic liver cirrhosisas well ashistory of SBP on chronic antibiotic therapy with ciprofloxacinwho [place] to the areaand was [facility] prior to [facility]. [facility] for evaluation of decompensated liver cirrhosis withincreasing abdominal distention and shortness of breath. Patient underwent ultrasound-guided [facility]acentesis [facility] with drainage of about 3.6 Bruce of fluid. Further drainage was not done due to hypotension. Patient had [facility] and was negative for hepatitis B and C with positive hepatitis A IgG, negative ASMA/AMA.Patient also noted to have a normal AFP level.  Alpha 1 antitrypsin and ceruloplasmin tested during recent hospitalization was within normal limit. Patient was discharged in stable condition and advised to follow-up with GI as an outpatient for EGD to screen for esophageal varices.  Presented to ED for evaluation of diffuse, sharp abdominal pain.  Which started overnight after admission.  Associated with couple of episodes of emesis of bright red blood. Admitted for variceal bleed, EGD was done and 5 bands were placed. Also evaluated for TIPS procedure and thought to be high risk due to high meld score.   GI [facility]. Received 3 unit of PRBC and 2 units of fresh frozen plasma and 1 unit of platelet.  Patient had another questionable variceal bleed, not sure whether it was variceal or from oral mucosa.  Received another unit.  After talking with GI it was thought that he will be better to [facility] for TIPS procedure if needed.  Patient also had an history of SBP.  Had paracentesis twice and culture remain negative.  He will continue his home dose of Cipro for prophylaxis.  Patient also developed right-sided pleural effusion most likely secondary to portal hypertension.  Head thoracentesis twice with removal of 4.8 Bruce of transudate fluid each time.  Thoracentesis resulted in improvement of his dyspnea.  He will continue with Lasix.  Discharge Diagnoses:  Principal Problem:   GI bleed Active Problems:   Hyponatremia   Decompensated liver disease (HCC)   Decompensated hepatic cirrhosis (HCC)   SBP (spontaneous bacterial peritonitis) (HCC)   Hematemesis with nausea   Secondary esophageal varices with bleeding (HCC)   Portal hypertensive gastropathy (HCC)   Protein-calorie malnutrition, severe   Ascites   Status post thoracentesis   Discharge Instructions   Allergies as of 03/01/2020   No Known Allergies     Medication List    TAKE these medications   ciprofloxacin 500 MG tablet Commonly known as: CIPRO Take 500 mg by mouth daily.   furosemide 40 MG tablet Commonly known as: LASIX Take 40 mg by mouth daily.   lactulose 10 GM/15ML solution Commonly known as: CHRONULAC Take 15 mLs by mouth in the morning, at noon, and at bedtime.   multivitamin with minerals Tabs tablet Take 1 tablet by  mouth daily.   spironolactone 100 MG tablet Commonly known as: ALDACTONE Take 100 mg by mouth daily.       No Known Allergies  Consultations:  GI  IR  Procedures/Studies: DG Chest 2 View  Result Date:  03/01/2020 CLINICAL DATA:  Pleural effusion EXAM: CHEST - 2 VIEW COMPARISON:  02/29/2020 FINDINGS: Large right pleural effusion has enlarged and now results in near complete collapse of the right lung with only a small portion of the right upper lobe now aerated. Left lung is clear. No pneumothorax. No pleural effusion on the left. Mild cardiomegaly is stable. No acute bone abnormality. IMPRESSION: Enlarging right pleural effusion now with subtotal collapse of the right lung. Electronically Signed   By: Helyn Numbers MD   On: 03/01/2020 08:19   DG Chest 2 View  Result Date: 02/27/2020 CLINICAL DATA:  RIGHT chest wall pain since thoracentesis performed yesterday EXAM: CHEST - 2 VIEW COMPARISON:  02/26/2020 FINDINGS: Enlargement of cardiac silhouette. Mediastinal contours normal. Increased RIGHT pleural effusion and lower lung atelectasis since prior study. RIGHT perihilar opacity seen on previous exam obscured by atelectasis. No pneumothorax. LEFT lung clear. IMPRESSION: Recurrent RIGHT pleural effusion and basilar atelectasis. Electronically Signed   By: Ulyses Southward M.D.   On: 02/27/2020 09:32   DG Chest 2 View  Result Date: 02/07/2020 CLINICAL DATA:  Shortness of breath, cirrhosis, abdominal distension EXAM: CHEST - 2 VIEW COMPARISON:  01/26/2020 FINDINGS: Similar large right pleural effusion with associated collapse/consolidation of the right mid and lower lung. Right apex remains aerated. Stable clear left lung. Normal heart size and vascularity. No pneumothorax. Trachea midline. No acute osseous finding. IMPRESSION: Similar large right effusion and associated right mid and lower lung collapse/consolidation. Electronically Signed   By: Judie Petit.  Shick M.D.   On: 02/07/2020 13:28   US Paracentesis  Result Date: 03/01/2020 INDICATION: Patient with a history of cirrhosis and recurrent large volume ascites. Interventional radiology asked to perform a therapeutic paracentesis. EXAM: ULTRASOUND GUIDED  PARACENTESIS MEDICATIONS: 1% lidocaine 10 mL COMPLICATIONS: None immediate. PROCEDURE: Informed written consent was obtained from the patient after a discussion of the risks, benefits and alternatives to treatment. A timeout was performed prior to the initiation of the procedure. Initial ultrasound scanning demonstrates a large amount of ascites within the right lower abdominal quadrant. The right lower abdomen was prepped and draped in the usual sterile fashion. 1% lidocaine was used for local anesthesia. Following this, a 6 Fr Safe-T-Centesis catheter was introduced. An ultrasound image was saved for documentation purposes. The paracentesis was performed. The catheter was removed and a dressing was applied. The patient tolerated the procedure well without immediate post procedural complication. FINDINGS: A total of approximately 3.8 Bruce of dark yellow fluid was removed. IMPRESSION: Successful ultrasound-guided paracentesis yielding 3.8 liters of peritoneal fluid. Read by: Alwyn Ren, NP Electronically Signed   By: Simonne Come M.D.   On: 03/01/2020 12:47   US Paracentesis  Result Date: 02/28/2020 INDICATION: History of cirrhosis with recurrent symptomatic ascites. Please perform ultrasound-guided paracentesis for therapeutic purposes. EXAM: ULTRASOUND-GUIDED PARACENTESIS COMPARISON:  Multiple previous ultrasound-guided paracenteses, most recently on 02/20/2020 yielding 3.6 Bruce of ascitic fluid. MEDICATIONS: None. COMPLICATIONS: None immediate. TECHNIQUE: Informed written consent was obtained from the patient after a discussion of the risks, benefits and alternatives to treatment. A timeout was performed prior to the initiation of the procedure. Initial ultrasound scanning demonstrates a large amount of ascites within the right lower abdomen which was subsequently prepped and draped in the  usual sterile fashion. 1% lidocaine with epinephrine was used for local anesthesia. An ultrasound image was saved for  documentation purposed. An 8 Fr Safe-T-Centesis catheter was introduced. The paracentesis was performed. The catheter was removed and a dressing was applied. The patient tolerated the procedure well without immediate post procedural complication. FINDINGS: A total of approximately 4.8 liters of serous fluid was removed. IMPRESSION: Successful ultrasound-guided paracentesis yielding 4.8 liters of peritoneal fluid. Electronically Signed   By: Simonne Come M.D.   On: 02/28/2020 12:24   US Paracentesis  Result Date: 02/20/2020 INDICATION: Patient with history of alcoholic cirrhosis, recurrent ascites; request received for diagnostic and therapeutic paracentesis. EXAM: ULTRASOUND GUIDED DIAGNOSTIC AND THERAPEUTIC PARACENTESIS MEDICATIONS: 1% lidocaine to skin and subcutaneous tissue COMPLICATIONS: None immediate. PROCEDURE: Informed written consent was obtained from the patient after a discussion of the risks, benefits and alternatives to treatment. A timeout was performed prior to the initiation of the procedure. Initial ultrasound scanning demonstrates a moderate to large amount of ascites within the right lower abdominal quadrant. The right lower abdomen was prepped and draped in the usual sterile fashion. 1% lidocaine was used for local anesthesia. Following this, a 19 gauge, 10-cm, Yueh catheter was introduced. An ultrasound image was saved for documentation purposes. The paracentesis was performed. The catheter was removed and a dressing was applied. The patient tolerated the procedure well without immediate post procedural complication. FINDINGS: A total of approximately 3.6 liters of turbid, yelllow fluid was removed. Samples were sent to the laboratory as requested by the clinical team. Due to patient hypotension only the above amount of fluid was removed today. IMPRESSION: Successful ultrasound-guided diagnostic and therapeutic paracentesis yielding 3.6 liters of peritoneal fluid. Read by: Jeananne Rama,  PA-C Electronically Signed   By: Corlis Leak M.D.   On: 02/20/2020 11:09   US Paracentesis  Result Date: 02/08/2020 INDICATION: Alcoholic cirrhosis with recurrent ascites. Request for diagnostic and therapeutic paracentesis. EXAM: ULTRASOUND GUIDED PARACENTESIS MEDICATIONS: 1% lidocaine 10 mL COMPLICATIONS: None immediate. PROCEDURE: Informed written consent was obtained from the patient after a discussion of the risks, benefits and alternatives to treatment. A timeout was performed prior to the initiation of the procedure. Initial ultrasound scanning demonstrates a moderate amount of ascites within the right lateral abdomen quadrant. The right lateral abdomen was prepped and draped in the usual sterile fashion. 1% lidocaine was used for local anesthesia. Following this, a 6 Fr Safe-T-Centesis catheter was introduced. An ultrasound image was saved for documentation purposes. The paracentesis was performed. The catheter was removed and a dressing was applied. The patient tolerated the procedure well without immediate post procedural complication. FINDINGS: A total of approximately 4.7 Bruce of clear yellow fluid was removed. Samples were sent to the laboratory as requested by the clinical team. IMPRESSION: Successful ultrasound-guided paracentesis yielding 4.7 liters of peritoneal fluid. Read by: Corrin Parker, PA-C Electronically Signed   By: Richarda Overlie M.D.   On: 02/08/2020 11:29   DG Chest Port 1 View  Result Date: 03/01/2020 CLINICAL DATA:  Thoracentesis EXAM: PORTABLE CHEST 1 VIEW COMPARISON:  Earlier today FINDINGS: Marked reduction in the right pleural effusion. No pneumothorax or re-expansion edema. Left lung is clear. Prominence of the right hilum which may be from incomplete re-expansion or perihilar fissural fluid. Normal for technique heart size. IMPRESSION: Marked reduction in the right pleural effusion without complicating feature. Electronically Signed   By: Marnee Spring M.D.   On: 03/01/2020  11:57   DG Chest [facility] 1 View  Result  Date: 02/29/2020 CLINICAL DATA:  Right pleural effusion EXAM: PORTABLE CHEST 1 VIEW COMPARISON:  02/27/2020 FINDINGS: Significant elevation of the right hemidiaphragm increased right pleural effusion and right lung atelectasis. Right perihilar opacity is partially obscured. Stable appearance of left lung. No pneumothorax. IMPRESSION: increased right pleural effusion and right lung atelectasis. Right perihilar opacity is partially obscured. Electronically Signed   By: Guadlupe Spanish M.D.   On: 02/29/2020 09:08   DG Chest Port 1 View  Result Date: 02/26/2020 CLINICAL DATA:  Right thoracentesis EXAM: PORTABLE CHEST 1 VIEW COMPARISON:  02/20/2020 FINDINGS: Effectively resolved right-sided pleural effusion. There is a focal right perihilar opacity at a level previously obscured by pleural fluid, up to 4.7 cm. Normal heart size. No pneumothorax. IMPRESSION: 1. Marked decrease in right pleural effusion. No complicating features. 2. Right perihilar opacity, at a level that was obscured on comparison radiographs. This could reflect mass, adenopathy, or fissural fluid. Recommend follow-up versus chest CT. Electronically Signed   By: Marnee Spring M.D.   On: 02/26/2020 09:46   DG CHEST PORT 1 VIEW  Result Date: 02/20/2020 CLINICAL DATA:  Shortness of breath EXAM: PORTABLE CHEST 1 VIEW COMPARISON:  02/07/2020 FINDINGS: Large right pleural effusion with worsened right upper lobe collapse. Near complete opacification of the right hemithorax. Left lung is clear. IMPRESSION: Large right pleural effusion with worsened right upper lobe collapse. Electronically Signed   By: Deatra Robinson M.D.   On: 02/20/2020 03:49   DG Abd Portable 2 Views  Result Date: 02/07/2020 CLINICAL DATA:  Abdominal distension EXAM: X-RAY ABDOMEN 2 VIEWS COMPARISON:  None. FINDINGS: The bowel gas pattern is normal. There is no evidence of free air. No radio-opaque calculi or other significant  radiographic abnormality is seen. Partially visualized pleural effusion at the right lung base. IMPRESSION: 1. Negative abdominal radiographs. 2. Partially visualized pleural effusion at the right lung base. Please refer to dedicated same-day chest radiograph. Electronically Signed   By: Duanne Guess D.O.   On: 02/07/2020 13:30   CT Angio Abd/Pel w/ and/or w/o  Result Date: 03/01/2020 CLINICAL DATA:  History of cirrhosis, now with liver failure and recurrent upper GI bleeding. Please perform BRT0 protocol CT for potential TIPS intervention. Note, patient underwent a thoracentesis and a paracentesis prior to this CT. EXAM: CTA ABDOMEN AND PELVIS WITHOUT AND WITH CONTRAST TECHNIQUE: Multidetector CT imaging of the abdomen and pelvis was performed using the standard protocol during bolus administration of intravenous contrast. Multiplanar reconstructed images and MIPs were obtained and reviewed to evaluate the vascular anatomy. CONTRAST:  OMNIPAQUE IOHEXOL 350 MG/ML SOLN COMPARISON:  None. FINDINGS: VASCULAR Aorta: Normal caliber the abdominal aorta. There is no significant atherosclerotic plaque within the abdominal aorta. No abdominal aortic dissection or periaortic stranding. Celiac: Widely patent without a hemodynamically significant narrowing. SMA: Widely patent without hemodynamically significant narrowing. The common hepatic artery is noted to arise from the proximal SMA. The distal tributaries the SMA appear widely patent without discrete intraluminal filling defect to suggest distal embolism. Renals: Solitary bilaterally; the bilateral renal arteries are widely patent without hemodynamically significant narrowing. No discrete vessel irregularity to suggest FMD. IMA: Widely patent without hemodynamically significant narrowing. Inflow: The bilateral common, external and internal iliac arteries are of normal caliber and widely patent without hemodynamically significant narrowing. Proximal Outflow:  The bilateral common and imaged portions of the bilateral deep and superficial femoral arteries are normal caliber and widely patent without hemodynamically significant narrowing. Veins: The IVC and pelvic venous systems appear widely  patent. The portal vein remains widely patent. Note is made of a hypertrophied splenorenal shunt though these do not appear to significantly contribute to hypertrophied gastric varices. There is partial recanalization of the periumbilical veins. Note is made of a hypertrophied mesenteric venous collaterals about the inferior tip of the spleen. The hepatic venous system this suboptimally opacified due to phase of enhancement though appears patent. Suspected common origin of the middle and left hepatic veins. Review of the MIP images confirms the above findings. _________________________________________________________ NON-VASCULAR Lower chest: Limited visualization of the lower thorax demonstrates a small residual right-sided pleural effusion post thoracentesis. Improved aeration of right lower lobe with residual ill-defined subpleural ground-glass opacities. Note is also made of ill-defined ground-glass opacities within the left lower lobe. No associated air bronchograms. Borderline cardiomegaly.  No pericardial effusion. Hepatobiliary: Shrunken liver with nodularity hepatic contour. There are no discrete hyperenhancing hepatic lesions. Small amount of residual perihepatic and intra-abdominal ascites following recent paracentesis. Clustered radiopaque gallstones are seen within the neck of the gallbladder. There is a dominant peripherally calcified gallstone also within the mid aspect of the gallbladder which measures approximately 4.5 x 2.8 x 2.8 cm. There is a minimal amount of calcification involving gallbladder fundus without gallbladder wall thickening or definitive pericholecystic fluid given the presence of intra-abdominal ascites. Pancreas: Normal appearance of the pancreas.  Spleen: The spleen is enlarged measuring 19.5 cm in length. Adrenals/Urinary Tract: There is symmetric enhancement and excretion of the bilateral kidneys. Nonobstructing renal stones are seen bilaterally including dominant stone within the inferior pole the right kidney measuring approximately 1.1 x 0.7 cm (image 46, series 2). Note is made of an approximately 2.2 cm nonenhancing minimally complex right renal cyst (image 63, series 3), as well as a more simple appearing approximately 2.1 cm cyst within the anterior inferior aspect the right kidney. Additional smaller renal cysts are seen bilaterally. Additional subcentimeter hypoattenuating renal lesions are too small to accurately characterize though favored to represent additional renal cysts. Normal appearance of the bilateral adrenal glands. Normal appearance of the urinary bladder given degree distention. Stomach/Bowel: Several varices are noted about the distal esophagus and GE junction without significant intraluminal extension. Moderate colonic stool burden without evidence of enteric obstruction. No discrete areas of bowel wall thickening. No discrete areas of intraluminal contrast extravasation. Normal appearance of the terminal ileum. There is a suspected appendicolith within the base of an otherwise normal-appearing appendix. No pneumoperitoneum, pneumatosis or portal venous gas. Lymphatic: No bulky retroperitoneal, mesenteric, pelvic or inguinal lymphadenopathy. Reproductive: Dystrophic calcifications within normal sized prostate gland. Several prominent phleboliths are seen with the left hemipelvis. There is a small amount of ascites within the lower pelvis. Other: Diffuse body wall anasarca. Musculoskeletal: No acute or aggressive osseous abnormalities. Moderate DDD of T11-T12 with disc space height loss, endplate irregularity, Schmorl's node formation within the superior endplate of the T12 vertebral body and ex vacuo disc phenomena. IMPRESSION:  VASCULAR 1. Patent portal venous system with conventional branching pattern. 2. Suboptimal visualization of the hepatic venous system though it appears patent with suspected common origin of the left and middle hepatic veins. 3. 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 4. Hypertrophied splenorenal shunt however this not appear to contribute to significant gastric varices. NON-VASCULAR 1. Cirrhosis and the stigmata of portal venous hypertension including splenomegaly, intra-abdominal ascites and right-sided hepatic hydrothorax. 2. No discrete hyperenhancing lesions to suggest hepatocellular carcinoma. 3. Cholelithiasis without evidence of cholecystitis. Electronically Signed   By: Holland Commons.D.  On: 03/01/2020 13:56   US Abdomen Limited RUQ  Result Date: 02/22/2020 CLINICAL DATA:  Liver cirrhosis. EXAM: ULTRASOUND ABDOMEN LIMITED RIGHT UPPER QUADRANT COMPARISON:  None. FINDINGS: Gallbladder: Large calcified gallstone measuring 4-5 mm. Gallbladder wall thickness upper normal at 2-3 mm. Sonographer reports no sonographic Murphy sign. Common bile duct: Diameter: 2-3 mm Liver: Nodular liver contour compatible with reported clinical history of cirrhosis. No focal parenchymal abnormality by ultrasound. Portal vein is patent on color Doppler imaging with normal direction of blood flow towards the liver. Other: Moderate to large volume ascites. Large right pleural effusion evident. IMPRESSION: 1. Nodular liver contour compatible with cirrhosis. No focal parenchymal abnormality by ultrasound. 2. Cholelithiasis. 3. Moderate to large volume ascites. 4. Large right pleural effusion. Electronically Signed   By: Kennith Center M.D.   On: 02/22/2020 10:43   US THORACENTESIS ASP PLEURAL SPACE W/IMG GUIDE  Result Date: 03/01/2020 INDICATION: Patient with a history of cirrhosis and recurrent pleural effusions. Interventional radiology asked to perform therapeutic  thoracentesis. EXAM: ULTRASOUND GUIDED THORACENTESIS MEDICATIONS: 1% lidocaine 10 mL COMPLICATIONS: None immediate. PROCEDURE: An ultrasound guided thoracentesis was thoroughly discussed with the patient and questions answered. The benefits, risks, alternatives and complications were also discussed. The patient understands and wishes to proceed with the procedure. Written consent was obtained. Ultrasound was performed to localize and mark an adequate pocket of fluid in the right chest. The area was then prepped and draped in the normal sterile fashion. 1% Lidocaine was used for local anesthesia. Under ultrasound guidance a 6 Fr Safe-T-Centesis catheter was introduced. Thoracentesis was performed. The catheter was removed and a dressing applied. FINDINGS: A total of approximately 4.8 Bruce of amber color fluid was removed. IMPRESSION: Successful ultrasound guided right thoracentesis yielding 4.8 Bruce of pleural fluid. Read by: Alwyn Ren, NP Electronically Signed   By: Simonne Come M.D.   On: 03/01/2020 12:05   US THORACENTESIS ASP PLEURAL SPACE W/IMG GUIDE  Result Date: 02/26/2020 INDICATION: CIRRHOSIS, LARGE RIGHT EFFUSION COMPATIBLE WITH HEPATIC HYDROTHORAX EXAM: ULTRASOUND GUIDED RIGHT THORACENTESIS MEDICATIONS: 1% lidocaine local COMPLICATIONS: None immediate. PROCEDURE: An ultrasound guided thoracentesis was thoroughly discussed with the patient and questions answered. The benefits, risks, alternatives and complications were also discussed. The patient understands and wishes to proceed with the procedure. Written consent was obtained. Ultrasound was performed to localize and mark an adequate pocket of fluid in the right chest. The area was then prepped and draped in the normal sterile fashion. 1% Lidocaine was used for local anesthesia. Under ultrasound guidance a 6 Fr Safe-T-Centesis catheter was introduced. Thoracentesis was performed. The catheter was removed and a dressing applied. FINDINGS: A total of  approximately 4.8 Bruce of amber colored pleural fluid was removed. Samples were sent to the laboratory as requested by the clinical team. IMPRESSION: Successful ultrasound guided right thoracentesis yielding 4.8 Bruce of pleural fluid. Electronically Signed   By: Judie Petit.  Shick M.D.   On: 02/26/2020 09:46     Subjective: Patient had 2 episodes of concerning hematemesis/mucosal bleed.  Denies any nausea but stating that he woke up with mouth full of bleeding.  Patient has thrombocytopenia and is also prone for mucosal bleed. Discussed transfer to [facility] as he was still waiting for [facility] transfer for a possible TIPS procedure if another variceal bleed.  Patient agrees. Will be transferred to [facility] once bed becomes available which occurred overnight.  Discharge Exam: Vitals:   03/01/20 1914 03/01/20 2034  BP: 108/71 105/60  Pulse: (!) 101 (!) 105  Resp: 18 20  Temp: 98.2 F (36.8 C) 98.8 F (37.1 C)  SpO2: 100% 100%   Vitals:   03/01/20 1356 03/01/20 1650 03/01/20 1914 03/01/20 2034  BP: 105/75 102/69 108/71 105/60  Pulse: 91 94 (!) 101 (!) 105  Resp: 16 16 18 20   Temp: (!) 97.5 F (36.4 C)  98.2 F (36.8 C) 98.8 F (37.1 C)  TempSrc:   Oral   SpO2: 100% 100% 100% 100%  Weight:      Height:        General: Pt is alert, awake, not in acute distress Cardiovascular: RRR, S1/S2 +, no rubs, no gallops Respiratory: CTA bilaterally, no wheezing, no rhonchi Abdominal: Soft, NT, ND, bowel sounds + Extremities: no edema, no cyanosis   The results of significant diagnostics from this hospitalization (including imaging, microbiology, ancillary and laboratory) are listed below for reference.    Microbiology: Recent Results (from the past 240 hour(s))  Body fluid culture     Status: None   Collection Time: 02/26/20  9:20 AM   Specimen: PATH Cytology Pleural fluid  Result Value Ref Range Status   Specimen Description   Final    PLEURAL Performed at [facility], [address], [place], [place] [address]    Special Requests   Final    PLEURAL Performed at [facility], [address] [place], [place] [address]    Gram Stain NO WBC SEEN NO ORGANISMS SEEN CYTOSPIN SMEAR   Final   Culture   Final    NO GROWTH Performed at [facility], [address], [place], [place] [address]    Report Status 02/29/2020 FINAL  Final     Labs: BNP (last 3 results) No results for input(s): BNP in the last 8760 hours. Basic Metabolic Panel: Recent Labs  Lab 02/28/20 0409 02/28/20 0409 02/29/20 0523 02/29/20 0523 03/01/20 0214 03/02/20 0231 03/03/20 1157 03/04/20 0833 03/05/20 0216  NA 131*   < > 132*   < > 132* 130* 130* 129* 130*  K 4.3   < > 4.7   < > 4.8 4.8 4.2 5.0 4.8  CL 99   < > 101   < > 103 102 99 101 102  CO2 25   < > 24   < > 22 22 21* 23 22  GLUCOSE 127*   < > 85   < > 100* 143* 102* 95 108*  BUN 32*   < > 19   < > 16 14 14 15 18   CREATININE 0.87   < > 0.48*   < > 0.53* 0.63 0.71 0.68 0.74  CALCIUM 8.7*   < > 8.7*   < > 8.7* 8.6* 9.2 9.1 9.0  MG 2.3  --  2.2  --   --   --   --   --   --    < > = values in this interval not displayed.   Liver Function Tests: Recent Labs  Lab 02/28/20 0406 03/01/20 0214 03/02/20 0231 03/03/20 1157 03/04/20 0833  AST 42* 34 32 44* 44*  ALT 26 20 21 23 26   ALKPHOS 84 90 64 105 98  BILITOT 11.2* 12.9* 14.5* 12.8* 12.0*  PROT 6.5 6.2* 5.1* 6.1* 5.6*  ALBUMIN 4.5 4.6 3.6 4.0 3.6   No results for input(s): LIPASE, AMYLASE in the last 168 hours. No results for input(s): AMMONIA in the last 168 hours. CBC: Recent Labs  Lab 02/29/20 1644 03/01/20 0214 03/01/20 0840  03/02/20 0231 03/03/20 0841 03/04/20 0833 03/05/20 0216  WBC 6.6   < > 5.8 5.3 5.4 5.1 6.5  NEUTROABS 4.6  --   --   --   --   --   --   HGB 7.6*   < > 8.5* 7.8* 7.9* 8.3* 8.2*  HCT 22.1*   < > 24.8* 23.3* 23.5* 25.0* 24.3*  MCV 101.4*   < > 99.6 101.3* 100.4* 101.2* 100.4*  PLT 55*   < > 54* 44* 50* 47* 58*   < > =  values in this interval not displayed.   Cardiac Enzymes: No results for input(s): CKTOTAL, CKMB, CKMBINDEX, TROPONINI in the last 168 hours. BNP: Invalid input(s): POCBNP CBG: No results for input(s): GLUCAP in the last 168 hours. D-Dimer No results for input(s): DDIMER in the last 72 hours. Hgb A1c No results for input(s): HGBA1C in the last 72 hours. Lipid Profile No results for input(s): CHOL, HDL, LDLCALC, TRIG, CHOLHDL, LDLDIRECT in the last 72 hours. Thyroid function studies No results for input(s): TSH, T4TOTAL, T3FREE, THYROIDAB in the last 72 hours.  Invalid input(s): FREET3 Anemia work up No results for input(s): VITAMINB12, FOLATE, FERRITIN, TIBC, IRON, RETICCTPCT in the last 72 hours. Urinalysis    Component Value Date/Time   COLORURINE AMBER (A) 02/19/2020 1646   APPEARANCEUR CLEAR 02/19/2020 1646   LABSPEC 1.020 02/19/2020 1646   PHURINE 5.5 02/19/2020 1646   GLUCOSEU NEGATIVE 02/19/2020 1646   HGBUR MODERATE (A) 02/19/2020 1646   BILIRUBINUR SMALL (A) 02/19/2020 1646   KETONESUR NEGATIVE 02/19/2020 1646   PROTEINUR NEGATIVE 02/19/2020 1646   NITRITE NEGATIVE 02/19/2020 1646   LEUKOCYTESUR TRACE (A) 02/19/2020 1646   Sepsis Labs Invalid input(s): PROCALCITONIN,  WBC,  LACTICIDVEN Microbiology Recent Results (from the past 240 hour(s))  Body fluid culture     Status: None   Collection Time: 02/26/20  9:20 AM   Specimen: PATH Cytology Pleural fluid  Result Value Ref Range Status   Specimen Description   Final    PLEURAL Performed at [facility], [address], [place], [place] [address]    Special Requests   Final    PLEURAL Performed at [facility], [address] [place], [place] [address]    Gram Stain NO WBC SEEN NO ORGANISMS SEEN CYTOSPIN SMEAR   Final   Culture   Final    NO GROWTH Performed at [facility], [address] [place], [place] [address]    Report Status 02/29/2020 FINAL  Final    Time  coordinating discharge: Over 30 minutes  SIGNED:  Arnetha Courser, MD  [facility] 03/05/2020, 5:05 PM  If 7PM-7AM, please contact night-coverage [email or web address]  This record has been created using Conservation officer, historic buildings. Errors have been sought and corrected,but may not always be located. Such creation errors do not reflect on the standard of care.

## 2020-03-05 NOTE — Discharge Summary (Signed)
Physician Discharge Summary  Bruce Bell GGY:[id number] DOB: 05-Apr-1986 DOA: 03/01/2020  PCP: [facility]  Admit date: 03/01/2020 Discharge date: 03/05/2020  Admitted From: Home Disposition:  Home  Recommendations for Outpatient Follow-up:  1. Follow up with PCP in 1 week 2. Follow up with with GI as well as transplant team at [facility]  Discharge Condition: Stable CODE STATUS: Full code Diet recommendation:  Diet Orders (From admission, onward)    Start     Ordered   03/02/20 0542  Diet Heart Room service appropriate? Yes; Fluid consistency: Thin  Diet effective now       Comments: Low sodium please. Thank you.  Question Answer Comment  Room service appropriate? Yes   Fluid consistency: Thin      03/02/20 0541         Brief/Interim Summary: Bruce Bell is a 34 y.o.malewith medical history significant foralcoholic liver cirrhosisas well ashistory of SBP on chronic antibiotic therapy with ciprofloxacinwho recently moved from [place] last month to the areaand was discharged from [facility] about 24 hours prior to presenting to the [facility]. He was seen at [facility] for evaluation of decompensated liver cirrhosis withincreasing abdominal distention and shortness of breath. Patient underwent ultrasound-guided paracentesis while at [facility] with drainage of about 3.6 L of fluid. Further drainage was not done due to hypotension. Patient had prior work-up done at [facility] and was negative for hepatitis B and C with positive hepatitis A IgG, negative ASMA/AMA.Patient also noted to have a normal AFP level. Alpha 1 antitrypsin and ceruloplasmin tested during recent hospitalization was within normal limit. Patient was discharged in stable condition and advised to follow-up with GI as an outpatient for EGD to screen for esophageal varices. He then presented to ED for evaluation of diffuse, sharp  abdominal pain, which started overnight after admission. Associated with couple of episodes of emesis of bright red blood. He was admitted for variceal bleed, EGD was done and 5 bands were placed. Also evaluated for TIPS procedure and thought to be high risk due to high MELD score. Received 3 unit of PRBC and 2 units of fresh frozen plasma and 1 unit of platelet so far.  GI and IR did not feel patient was a good candidate for TIPS procedure.  Patient remained clinically stable after his EGD.  He also underwent thoracentesis and paracentesis during hospitalization.  He was managed on Protonix and octreotide drip.  Due to his clinical stability, patient was discharged home and to follow-up as an outpatient with GI and transplant clinic.  Discharge Diagnoses:  Principal Problem:   Secondary esophageal varices with bleeding (HCC) Active Problems:   Hematemesis with nausea   Protein-calorie malnutrition, severe   Ascites   Status post thoracentesis   ALC (alcoholic liver cirrhosis) (HCC)   Status post abdominal paracentesis   Upper GI bleeding   Variceal bleed s/p EGD and EVL x5 on 02/22/20 -According to GI and radiology, patient is not a good candidate for TIPS procedure which can only be done in case of life-threatening GI bleed. 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 -Patient has MELD score of 31. Patient had repeat paracentesis at [facility] removal of 3.8L for symptom relief. No labs done as recent labs  were negative for SBP. -Continue with low-sodium diet. -Continue home dose of LasixandAldactone -GI following; follow-up with GI as well as transplant clinic at [facility]  History of SBP -Repeat  paracentesis was negative for SBP recurrence. -Continue home dose of Cipro for prophylaxis.  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 Thoracentesis was repeated again 10/22 with worsening dyspnea and removal of another 4.8 L. Resulted in improvement of his symptoms. -Continue to monitor. -Continue home dose of Lasix.  Hyponatremia -Secondary to liver cirrhosis.Stable around 130. -Continue to monitor.   Discharge Instructions  Discharge Instructions    Call MD for:  difficulty breathing, headache or visual disturbances   Complete by: As directed    Call MD for:  extreme fatigue   Complete by: As directed    Call MD for:  persistant dizziness or light-headedness   Complete by: As directed    Call MD for:  persistant nausea and vomiting   Complete by: As directed    Call MD for:  severe uncontrolled pain   Complete by: As directed    Call MD for:  temperature >100.4   Complete by: As directed    Discharge instructions   Complete by: As directed    You were cared for by a hospitalist during your hospital stay. If you have any questions about your discharge medications or the care you received while you were in the hospital after you are discharged, you can call the unit and ask to speak with the hospitalist on call if the hospitalist that took care of you is not available. Once you are discharged, your primary care physician will handle any further medical issues. Please note that NO REFILLS for any discharge medications will be authorized once you are discharged, as it is imperative that you return to your primary care physician (or establish a relationship with a primary care physician if you do not have one) for your aftercare needs so that they can reassess your need for medications and monitor your lab values.    Increase activity slowly   Complete by: As directed    No wound care   Complete by: As directed      Allergies as of 03/05/2020   No Known Allergies     Medication List    STOP taking these medications   chlorhexidine 0.12 % solution Commonly known as: PERIDEX   feeding supplement Liqd   hydrOXYzine 25 MG tablet Commonly known as: ATARAX/VISTARIL   octreotide 500 mcg in sodium chloride 0.9 % 250 mL     TAKE these medications   ciprofloxacin 500 MG tablet Commonly known as: CIPRO Take 500 mg by mouth daily.   furosemide 40 MG tablet Commonly known as: LASIX Take 40 mg by mouth daily.   lactulose 10 GM/15ML solution Commonly known as: CHRONULAC Take 15 mLs by mouth in the morning, at noon, and at bedtime.   multivitamin with minerals Tabs tablet Take 1 tablet by mouth daily.   pantoprazole 40 MG tablet Commonly known as: PROTONIX Take 1 tablet (40 mg total) by mouth 2 (two) times daily before a meal. What changed: when to take this   spironolactone 100 MG tablet Commonly known as: ALDACTONE Take 100 mg by mouth daily.   VITAMIN K PO Take 1 Dose by mouth daily.       Follow-up Information  [facility] Schedule an appointment as soon as possible for a visit in 1 week(s).   Contact information: [address] [place] [address] [phone]        Audrie Lia, MD Follow up.   Specialty: Internal Medicine Contact information: [address] MEDICINE [place] [address] [phone]        Pasty Spillers, MD Follow up.   Specialty: Gastroenterology Contact information: [address] [place] [address] [phone]              No Known Allergies  Consultations:  GI   Procedures/Studies: DG Chest 2 View  Result Date: 03/01/2020 CLINICAL DATA:  Pleural effusion EXAM: CHEST - 2 VIEW COMPARISON:  02/29/2020 FINDINGS: Large right pleural effusion has enlarged and now results in near complete  collapse of the right lung with only a small portion of the right upper lobe now aerated. Left lung is clear. No pneumothorax. No pleural effusion on the left. Mild cardiomegaly is stable. No acute bone abnormality. IMPRESSION: Enlarging right pleural effusion now with subtotal collapse of the right lung. Electronically Signed   By: Helyn Numbers MD   On: 03/01/2020 08:19   DG Chest 2 View  Result Date: 02/27/2020 CLINICAL DATA:  RIGHT chest wall pain since thoracentesis performed yesterday EXAM: CHEST - 2 VIEW COMPARISON:  02/26/2020 FINDINGS: Enlargement of cardiac silhouette. Mediastinal contours normal. Increased RIGHT pleural effusion and lower lung atelectasis since prior study. RIGHT perihilar opacity seen on previous exam obscured by atelectasis. No pneumothorax. LEFT lung clear. IMPRESSION: Recurrent RIGHT pleural effusion and basilar atelectasis. Electronically Signed   By: Ulyses Southward M.D.   On: 02/27/2020 09:32   DG Chest 2 View  Result Date: 02/07/2020 CLINICAL DATA:  Shortness of breath, cirrhosis, abdominal distension EXAM: CHEST - 2 VIEW COMPARISON:  01/26/2020 FINDINGS: Similar large right pleural effusion with associated collapse/consolidation of the right mid and lower lung. Right apex remains aerated. Stable clear left lung. Normal heart size and vascularity. No pneumothorax. Trachea midline. No acute osseous finding. IMPRESSION: Similar large right effusion and associated right mid and lower lung collapse/consolidation. Electronically Signed   By: Judie Petit.  Shick M.D.   On: 02/07/2020 13:28   US Paracentesis  Result Date: 03/01/2020 INDICATION: Patient with a history of cirrhosis and recurrent large volume ascites. Interventional radiology asked to perform a therapeutic paracentesis. EXAM: ULTRASOUND GUIDED PARACENTESIS MEDICATIONS: 1% lidocaine 10 mL COMPLICATIONS: None immediate. PROCEDURE: Informed written consent was obtained from the patient after a discussion of the risks,  benefits and alternatives to treatment. A timeout was performed prior to the initiation of the procedure. Initial ultrasound scanning demonstrates a large amount of ascites within the right lower abdominal quadrant. The right lower abdomen was prepped and draped in the usual sterile fashion. 1% lidocaine was used for local anesthesia. Following this, a 6 Fr Safe-T-Centesis catheter was introduced. An ultrasound image was saved for documentation purposes. The paracentesis was performed. The catheter was removed and a dressing was applied. The patient tolerated the procedure well without immediate post procedural complication. FINDINGS: A total of approximately 3.8 L of dark yellow fluid was removed. IMPRESSION: Successful ultrasound-guided paracentesis yielding 3.8 liters of peritoneal fluid. Read by: Alwyn Ren, NP Electronically Signed   By: Simonne Come M.D.   On: 03/01/2020 12:47   US Paracentesis  Result Date: 02/28/2020 INDICATION: History of cirrhosis with recurrent symptomatic ascites. Please perform ultrasound-guided paracentesis for therapeutic purposes. EXAM: ULTRASOUND-GUIDED PARACENTESIS COMPARISON:  Multiple  previous ultrasound-guided paracenteses, most recently on 02/20/2020 yielding 3.6 L of ascitic fluid. MEDICATIONS: None. COMPLICATIONS: None immediate. TECHNIQUE: Informed written consent was obtained from the patient after a discussion of the risks, benefits and alternatives to treatment. A timeout was performed prior to the initiation of the procedure. Initial ultrasound scanning demonstrates a large amount of ascites within the right lower abdomen which was subsequently prepped and draped in the usual sterile fashion. 1% lidocaine with epinephrine was used for local anesthesia. An ultrasound image was saved for documentation purposed. An 8 Fr Safe-T-Centesis catheter was introduced. The paracentesis was performed. The catheter was removed and a dressing was applied. The patient tolerated  the procedure well without immediate post procedural complication. FINDINGS: A total of approximately 4.8 liters of serous fluid was removed. IMPRESSION: Successful ultrasound-guided paracentesis yielding 4.8 liters of peritoneal fluid. Electronically Signed   By: Simonne ComeJohn  Watts M.D.   On: 02/28/2020 12:24   US Paracentesis  Result Date: 02/20/2020 INDICATION: Patient with history of alcoholic cirrhosis, recurrent ascites; request received for diagnostic and therapeutic paracentesis. EXAM: ULTRASOUND GUIDED DIAGNOSTIC AND THERAPEUTIC PARACENTESIS MEDICATIONS: 1% lidocaine to skin and subcutaneous tissue COMPLICATIONS: None immediate. PROCEDURE: Informed written consent was obtained from the patient after a discussion of the risks, benefits and alternatives to treatment. A timeout was performed prior to the initiation of the procedure. Initial ultrasound scanning demonstrates a moderate to large amount of ascites within the right lower abdominal quadrant. The right lower abdomen was prepped and draped in the usual sterile fashion. 1% lidocaine was used for local anesthesia. Following this, a 19 gauge, 10-cm, Yueh catheter was introduced. An ultrasound image was saved for documentation purposes. The paracentesis was performed. The catheter was removed and a dressing was applied. The patient tolerated the procedure well without immediate post procedural complication. FINDINGS: A total of approximately 3.6 liters of turbid, yelllow fluid was removed. Samples were sent to the laboratory as requested by the clinical team. Due to patient hypotension only the above amount of fluid was removed today. IMPRESSION: Successful ultrasound-guided diagnostic and therapeutic paracentesis yielding 3.6 liters of peritoneal fluid. Read by: Jeananne RamaKevin Allred, PA-C Electronically Signed   By: Corlis Leak  Hassell M.D.   On: 02/20/2020 11:09   US Paracentesis  Result Date: 02/08/2020 INDICATION: Alcoholic cirrhosis with recurrent ascites. Request  for diagnostic and therapeutic paracentesis. EXAM: ULTRASOUND GUIDED PARACENTESIS MEDICATIONS: 1% lidocaine 10 mL COMPLICATIONS: None immediate. PROCEDURE: Informed written consent was obtained from the patient after a discussion of the risks, benefits and alternatives to treatment. A timeout was performed prior to the initiation of the procedure. Initial ultrasound scanning demonstrates a moderate amount of ascites within the right lateral abdomen quadrant. The right lateral abdomen was prepped and draped in the usual sterile fashion. 1% lidocaine was used for local anesthesia. Following this, a 6 Fr Safe-T-Centesis catheter was introduced. An ultrasound image was saved for documentation purposes. The paracentesis was performed. The catheter was removed and a dressing was applied. The patient tolerated the procedure well without immediate post procedural complication. FINDINGS: A total of approximately 4.7 L of clear yellow fluid was removed. Samples were sent to the laboratory as requested by the clinical team. IMPRESSION: Successful ultrasound-guided paracentesis yielding 4.7 liters of peritoneal fluid. Read by: Corrin ParkerWendy Blair, PA-C Electronically Signed   By: Richarda OverlieAdam  Henn M.D.   On: 02/08/2020 11:29   DG Chest Port 1 View  Result Date: 03/01/2020 CLINICAL DATA:  Thoracentesis EXAM: PORTABLE CHEST 1 VIEW COMPARISON:  Earlier today FINDINGS:  Marked reduction in the right pleural effusion. No pneumothorax or re-expansion edema. Left lung is clear. Prominence of the right hilum which may be from incomplete re-expansion or perihilar fissural fluid. Normal for technique heart size. IMPRESSION: Marked reduction in the right pleural effusion without complicating feature. Electronically Signed   By: Marnee Spring M.D.   On: 03/01/2020 11:57   DG Chest Port 1 View  Result Date: 02/29/2020 CLINICAL DATA:  Right pleural effusion EXAM: PORTABLE CHEST 1 VIEW COMPARISON:  02/27/2020 FINDINGS: Significant elevation of  the right hemidiaphragm increased right pleural effusion and right lung atelectasis. Right perihilar opacity is partially obscured. Stable appearance of left lung. No pneumothorax. IMPRESSION: increased right pleural effusion and right lung atelectasis. Right perihilar opacity is partially obscured. Electronically Signed   By: Guadlupe Spanish M.D.   On: 02/29/2020 09:08   DG Chest Port 1 View  Result Date: 02/26/2020 CLINICAL DATA:  Right thoracentesis EXAM: PORTABLE CHEST 1 VIEW COMPARISON:  02/20/2020 FINDINGS: Effectively resolved right-sided pleural effusion. There is a focal right perihilar opacity at a level previously obscured by pleural fluid, up to 4.7 cm. Normal heart size. No pneumothorax. IMPRESSION: 1. Marked decrease in right pleural effusion. No complicating features. 2. Right perihilar opacity, at a level that was obscured on comparison radiographs. This could reflect mass, adenopathy, or fissural fluid. Recommend follow-up versus chest CT. Electronically Signed   By: Marnee Spring M.D.   On: 02/26/2020 09:46   DG CHEST PORT 1 VIEW  Result Date: 02/20/2020 CLINICAL DATA:  Shortness of breath EXAM: PORTABLE CHEST 1 VIEW COMPARISON:  02/07/2020 FINDINGS: Large right pleural effusion with worsened right upper lobe collapse. Near complete opacification of the right hemithorax. Left lung is clear. IMPRESSION: Large right pleural effusion with worsened right upper lobe collapse. Electronically Signed   By: Deatra Robinson M.D.   On: 02/20/2020 03:49   DG Abd Portable 2 Views  Result Date: 02/07/2020 CLINICAL DATA:  Abdominal distension EXAM: X-RAY ABDOMEN 2 VIEWS COMPARISON:  None. FINDINGS: The bowel gas pattern is normal. There is no evidence of free air. No radio-opaque calculi or other significant radiographic abnormality is seen. Partially visualized pleural effusion at the right lung base. IMPRESSION: 1. Negative abdominal radiographs. 2. Partially visualized pleural effusion at the  right lung base. Please refer to dedicated same-day chest radiograph. Electronically Signed   By: Duanne Guess D.O.   On: 02/07/2020 13:30   CT Angio Abd/Pel w/ and/or w/o  Result Date: 03/01/2020 CLINICAL DATA:  History of cirrhosis, now with liver failure and recurrent upper GI bleeding. Please perform BRT0 protocol CT for potential TIPS intervention. Note, patient underwent a thoracentesis and a paracentesis prior to this CT. EXAM: CTA ABDOMEN AND PELVIS WITHOUT AND WITH CONTRAST TECHNIQUE: Multidetector CT imaging of the abdomen and pelvis was performed using the standard protocol during bolus administration of intravenous contrast. Multiplanar reconstructed images and MIPs were obtained and reviewed to evaluate the vascular anatomy. CONTRAST:  OMNIPAQUE IOHEXOL 350 MG/ML SOLN COMPARISON:  None. FINDINGS: VASCULAR Aorta: Normal caliber the abdominal aorta. There is no significant atherosclerotic plaque within the abdominal aorta. No abdominal aortic dissection or periaortic stranding. Celiac: Widely patent without a hemodynamically significant narrowing. SMA: Widely patent without hemodynamically significant narrowing. The common hepatic artery is noted to arise from the proximal SMA. The distal tributaries the SMA appear widely patent without discrete intraluminal filling defect to suggest distal embolism. Renals: Solitary bilaterally; the bilateral renal arteries are widely patent without hemodynamically significant  narrowing. No discrete vessel irregularity to suggest FMD. IMA: Widely patent without hemodynamically significant narrowing. Inflow: The bilateral common, external and internal iliac arteries are of normal caliber and widely patent without hemodynamically significant narrowing. Proximal Outflow: The bilateral common and imaged portions of the bilateral deep and superficial femoral arteries are normal caliber and widely patent without hemodynamically significant narrowing. Veins: The  IVC and pelvic venous systems appear widely patent. The portal vein remains widely patent. Note is made of a hypertrophied splenorenal shunt though these do not appear to significantly contribute to hypertrophied gastric varices. There is partial recanalization of the periumbilical veins. Note is made of a hypertrophied mesenteric venous collaterals about the inferior tip of the spleen. The hepatic venous system this suboptimally opacified due to phase of enhancement though appears patent. Suspected common origin of the middle and left hepatic veins. Review of the MIP images confirms the above findings. _________________________________________________________ NON-VASCULAR Lower chest: Limited visualization of the lower thorax demonstrates a small residual right-sided pleural effusion post thoracentesis. Improved aeration of right lower lobe with residual ill-defined subpleural ground-glass opacities. Note is also made of ill-defined ground-glass opacities within the left lower lobe. No associated air bronchograms. Borderline cardiomegaly.  No pericardial effusion. Hepatobiliary: Shrunken liver with nodularity hepatic contour. There are no discrete hyperenhancing hepatic lesions. Small amount of residual perihepatic and intra-abdominal ascites following recent paracentesis. Clustered radiopaque gallstones are seen within the neck of the gallbladder. There is a dominant peripherally calcified gallstone also within the mid aspect of the gallbladder which measures approximately 4.5 x 2.8 x 2.8 cm. There is a minimal amount of calcification involving gallbladder fundus without gallbladder wall thickening or definitive pericholecystic fluid given the presence of intra-abdominal ascites. Pancreas: Normal appearance of the pancreas. Spleen: The spleen is enlarged measuring 19.5 cm in length. Adrenals/Urinary Tract: There is symmetric enhancement and excretion of the bilateral kidneys. Nonobstructing renal stones are seen  bilaterally including dominant stone within the inferior pole the right kidney measuring approximately 1.1 x 0.7 cm (image 46, series 2). Note is made of an approximately 2.2 cm nonenhancing minimally complex right renal cyst (image 63, series 3), as well as a more simple appearing approximately 2.1 cm cyst within the anterior inferior aspect the right kidney. Additional smaller renal cysts are seen bilaterally. Additional subcentimeter hypoattenuating renal lesions are too small to accurately characterize though favored to represent additional renal cysts. Normal appearance of the bilateral adrenal glands. Normal appearance of the urinary bladder given degree distention. Stomach/Bowel: Several varices are noted about the distal esophagus and GE junction without significant intraluminal extension. Moderate colonic stool burden without evidence of enteric obstruction. No discrete areas of bowel wall thickening. No discrete areas of intraluminal contrast extravasation. Normal appearance of the terminal ileum. There is a suspected appendicolith within the base of an otherwise normal-appearing appendix. No pneumoperitoneum, pneumatosis or portal venous gas. Lymphatic: No bulky retroperitoneal, mesenteric, pelvic or inguinal lymphadenopathy. Reproductive: Dystrophic calcifications within normal sized prostate gland. Several prominent phleboliths are seen with the left hemipelvis. There is a small amount of ascites within the lower pelvis. Other: Diffuse body wall anasarca. Musculoskeletal: No acute or aggressive osseous abnormalities. Moderate DDD of T11-T12 with disc space height loss, endplate irregularity, Schmorl's node formation within the superior endplate of the T12 vertebral body and ex vacuo disc phenomena. IMPRESSION: VASCULAR 1. Patent portal venous system with conventional branching pattern. 2. Suboptimal visualization of the hepatic venous system though it appears patent with suspected common origin of the  left  and middle hepatic veins. 3. 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 4. Hypertrophied splenorenal shunt however this not appear to contribute to significant gastric varices. NON-VASCULAR 1. Cirrhosis and the stigmata of portal venous hypertension including splenomegaly, intra-abdominal ascites and right-sided hepatic hydrothorax. 2. No discrete hyperenhancing lesions to suggest hepatocellular carcinoma. 3. Cholelithiasis without evidence of cholecystitis. Electronically Signed   By: Simonne Come M.D.   On: 03/01/2020 13:56   US Abdomen Limited RUQ  Result Date: 02/22/2020 CLINICAL DATA:  Liver cirrhosis. EXAM: ULTRASOUND ABDOMEN LIMITED RIGHT UPPER QUADRANT COMPARISON:  None. FINDINGS: Gallbladder: Large calcified gallstone measuring 4-5 mm. Gallbladder wall thickness upper normal at 2-3 mm. Sonographer reports no sonographic Murphy sign. Common bile duct: Diameter: 2-3 mm Liver: Nodular liver contour compatible with reported clinical history of cirrhosis. No focal parenchymal abnormality by ultrasound. Portal vein is patent on color Doppler imaging with normal direction of blood flow towards the liver. Other: Moderate to large volume ascites. Large right pleural effusion evident. IMPRESSION: 1. Nodular liver contour compatible with cirrhosis. No focal parenchymal abnormality by ultrasound. 2. Cholelithiasis. 3. Moderate to large volume ascites. 4. Large right pleural effusion. Electronically Signed   By: Kennith Center M.D.   On: 02/22/2020 10:43   US THORACENTESIS ASP PLEURAL SPACE W/IMG GUIDE  Result Date: 03/01/2020 INDICATION: Patient with a history of cirrhosis and recurrent pleural effusions. Interventional radiology asked to perform therapeutic thoracentesis. EXAM: ULTRASOUND GUIDED THORACENTESIS MEDICATIONS: 1% lidocaine 10 mL COMPLICATIONS: None immediate. PROCEDURE: An ultrasound guided thoracentesis was thoroughly discussed with  the patient and questions answered. The benefits, risks, alternatives and complications were also discussed. The patient understands and wishes to proceed with the procedure. Written consent was obtained. Ultrasound was performed to localize and mark an adequate pocket of fluid in the right chest. The area was then prepped and draped in the normal sterile fashion. 1% Lidocaine was used for local anesthesia. Under ultrasound guidance a 6 Fr Safe-T-Centesis catheter was introduced. Thoracentesis was performed. The catheter was removed and a dressing applied. FINDINGS: A total of approximately 4.8 L of amber color fluid was removed. IMPRESSION: Successful ultrasound guided right thoracentesis yielding 4.8 L of pleural fluid. Read by: Alwyn Ren, NP Electronically Signed   By: Simonne Come M.D.   On: 03/01/2020 12:05   US THORACENTESIS ASP PLEURAL SPACE W/IMG GUIDE  Result Date: 02/26/2020 INDICATION: CIRRHOSIS, LARGE RIGHT EFFUSION COMPATIBLE WITH HEPATIC HYDROTHORAX EXAM: ULTRASOUND GUIDED RIGHT THORACENTESIS MEDICATIONS: 1% lidocaine local COMPLICATIONS: None immediate. PROCEDURE: An ultrasound guided thoracentesis was thoroughly discussed with the patient and questions answered. The benefits, risks, alternatives and complications were also discussed. The patient understands and wishes to proceed with the procedure. Written consent was obtained. Ultrasound was performed to localize and mark an adequate pocket of fluid in the right chest. The area was then prepped and draped in the normal sterile fashion. 1% Lidocaine was used for local anesthesia. Under ultrasound guidance a 6 Fr Safe-T-Centesis catheter was introduced. Thoracentesis was performed. The catheter was removed and a dressing applied. FINDINGS: A total of approximately 4.8 L of amber colored pleural fluid was removed. Samples were sent to the laboratory as requested by the clinical team. IMPRESSION: Successful ultrasound guided right  thoracentesis yielding 4.8 L of pleural fluid. Electronically Signed   By: Judie Petit.  Shick M.D.   On: 02/26/2020 09:46       Discharge Exam: Vitals:   03/05/20 0523 03/05/20 0817  BP: (!) 103/53 114/67  Pulse: 98 92  Resp: 20 16  Temp: 98.4 F (36.9 C) 98.1 F (36.7 C)  SpO2: 95% 95%    General: Pt is alert, awake, not in acute distress, with scleral icterus Cardiovascular: RRR, S1/S2 +, no edema Respiratory: CTA bilaterally, no wheezing, no rhonchi, no respiratory distress, no conversational dyspnea  Abdominal: Soft, NT, ND, bowel sounds + Extremities: no edema, no cyanosis Psych: Normal mood and affect, stable judgement and insight     The results of significant diagnostics from this hospitalization (including imaging, microbiology, ancillary and laboratory) are listed below for reference.     Microbiology: Recent Results (from the past 240 hour(s))  Body fluid culture     Status: None   Collection Time: 02/26/20  9:20 AM   Specimen: PATH Cytology Pleural fluid  Result Value Ref Range Status   Specimen Description   Final    PLEURAL Performed at [facility], [address] [place], [place] [address]    Special Requests   Final    PLEURAL Performed at [facility], [address] [place], [place] [address]    Gram Stain NO WBC SEEN NO ORGANISMS SEEN CYTOSPIN SMEAR   Final   Culture   Final    NO GROWTH Performed at [facility], [address] [place], [place] [address]    Report Status 02/29/2020 FINAL  Final     Labs: BNP (last 3 results) No results for input(s): BNP in the last 8760 hours. Basic Metabolic Panel: Recent Labs  Lab 02/28/20 0409 02/28/20 0409 02/29/20 0523 02/29/20 0523 03/01/20 0214 03/02/20 0231 03/03/20 1157 03/04/20 0833 03/05/20 0216  NA 131*   < > 132*   < > 132* 130* 130* 129* 130*  K 4.3   < > 4.7   < > 4.8 4.8 4.2 5.0 4.8  CL 99   < > 101   < > 103 102 99 101 102  CO2 25   < > 24   < > 22 22 21*  23 22  GLUCOSE 127*   < > 85   < > 100* 143* 102* 95 108*  BUN 32*   < > 19   < > CREATININE 0.87   < > 0.48*   < > 0.53* 0.63 0.71 0.68 0.74  CALCIUM 8.7*   < > 8.7*   < > 8.7* 8.6* 9.2 9.1 9.0  MG 2.3  --  2.2  --   --   --   --   --   --    < > = values in this interval not displayed.   Liver Function Tests: Recent Labs  Lab 02/28/20 0406 03/01/20 0214 03/02/20 0231 03/03/20 1157 03/04/20 0833  AST 42* 34 32 44* 44*  ALT ALKPHOS 84 90 64 105 98  BILITOT 11.2* 12.9* 14.5* 12.8* 12.0*  PROT 6.5 6.2* 5.1* 6.1* 5.6*  ALBUMIN 4.5 4.6 3.6 4.0 3.6   No results for input(s): LIPASE, AMYLASE in the last 168 hours. No results for input(s): AMMONIA in the last 168 hours. CBC: Recent Labs  Lab 02/29/20 1644 03/01/20 0214 03/01/20 0840 03/02/20 0231 03/03/20 0841 03/04/20 0833 03/05/20 0216  WBC 6.6   < > 5.8 5.3 5.4 5.1 6.5  NEUTROABS 4.6  --   --   --   --   --   --   HGB 7.6*   < > 8.5* 7.8* 7.9* 8.3* 8.2*  HCT 22.1*   < > 24.8* 23.3* 23.5* 25.0* 24.3*  MCV 101.4*   < > 99.6 101.3* 100.4* 101.2* 100.4*  PLT 55*   < > 54* 44* 50* 47* 58*   < > = values in this interval not displayed.   Cardiac Enzymes: No results for input(s): CKTOTAL, CKMB, CKMBINDEX, TROPONINI in the last 168 hours. BNP: Invalid input(s): POCBNP CBG: No results for input(s): GLUCAP in the last 168 hours. D-Dimer No results for input(s): DDIMER in the last 72 hours. Hgb A1c No results for input(s): HGBA1C in the last 72 hours. Lipid Profile No results for input(s): CHOL, HDL, LDLCALC, TRIG, CHOLHDL, LDLDIRECT in the last 72 hours. Thyroid function studies No results for input(s): TSH, T4TOTAL, T3FREE, THYROIDAB in the last 72 hours.  Invalid input(s): FREET3 Anemia work up No results for input(s): VITAMINB12, FOLATE, FERRITIN, TIBC, IRON, RETICCTPCT in the last 72 hours. Urinalysis    Component Value Date/Time   COLORURINE AMBER (A) 02/19/2020 1646    APPEARANCEUR CLEAR 02/19/2020 1646   LABSPEC 1.020 02/19/2020 1646   PHURINE 5.5 02/19/2020 1646   GLUCOSEU NEGATIVE 02/19/2020 1646   HGBUR MODERATE (A) 02/19/2020 1646   BILIRUBINUR SMALL (A) 02/19/2020 1646   KETONESUR NEGATIVE 02/19/2020 1646   PROTEINUR NEGATIVE 02/19/2020 1646   NITRITE NEGATIVE 02/19/2020 1646   LEUKOCYTESUR TRACE (A) 02/19/2020 1646   Sepsis Labs Invalid input(s): PROCALCITONIN,  WBC,  LACTICIDVEN Microbiology Recent Results (from the past 240 hour(s))  Body fluid culture     Status: None   Collection Time: 02/26/20  9:20 AM   Specimen: PATH Cytology Pleural fluid  Result Value Ref Range Status   Specimen Description   Final    PLEURAL Performed at [facility], [address], [place], [place] [address]    Special Requests   Final    PLEURAL Performed at [facility], [address] [place], [place] [address]    Gram Stain NO WBC SEEN NO ORGANISMS SEEN CYTOSPIN SMEAR   Final   Culture   Final    NO GROWTH Performed at [facility], [address] [place], [place] [address]    Report Status 02/29/2020 FINAL  Final     Patient was seen and examined on the day of discharge and was found to be in stable condition. Time coordinating discharge: 40 minutes including assessment and coordination of care, as well as examination of the patient.   SIGNED:  Noralee Stain, DO [facility] 03/05/2020, 10:09 AM

## 2020-03-05 NOTE — Telephone Encounter (Signed)
Contacted patient and let him know the below information and he stated that he was contacted by [facility] and that they will see him next week.

## 2020-03-05 NOTE — TOC Transition Note (Addendum)
Transition of Care [facility]) - CM/SW Discharge Note   Patient Details  Name: Bruce Bell MRN: [id number] Date of Birth: July 05, 1985  Transition of Care [facility]) CM/SW Contact:  Bess Kinds, RN Phone Number: [phone] 03/05/2020, 11:25 AM   Clinical Narrative:     Notified by Karl Pock, [place], of patient not in network when he admits to any [facility] facility. Advised that patient may go to [employer], [employer], [employer], and [employer] in order to be covered for in network status. Patient does not have an out of network benefit.   Spoke with patient at the bedside to discuss his [place] insurance benefit. Discussed that for patient to receive his benefit from [place] that he needs to go to an in network facility. Advised of the local in network facilities as listed above. Further advised that if patient continues to come to [facility] facilities that [facility] Auer[facility] will deny the admission, and patient will be billed for the stay.  Advised that [facility] A[facility] has already provided some approvals for transplant follow up at [facility]. Patient verbalized understanding and agreed with follow up with [facility] for future services. .   Returned to bedside to review patient understanding of in network benefits. Discussed the hospitals that are part of . Advised that in an emergency to not worry about network status, but to get to the nearest hospital. Patient verbalized understanding. Discussed that he has already been referred to an accepting provider at [facility]. Patient stated that he will be following up with Dr. Brooke Dare and will await a phone call for follow up.   Final next level of care: Home/Self Care Barriers to Discharge: No Barriers Identified   Patient Goals and CMS Choice     Choice offered to / list presented to : NA  Discharge Placement                       Discharge Plan and Services                DME Arranged: N/A DME Agency: NA       HH Arranged: NA          Social  Determinants of Health (SDOH) Interventions     Readmission Risk Interventions Readmission Risk Prevention Plan 02/25/2020  Transportation Screening Complete  PCP or Specialist Appt within 3-5 Days Complete  Palliative Care Screening Not Applicable  Medication Review (RN Care Manager) Complete

## 2020-03-05 NOTE — Progress Notes (Signed)
[facility] Gastroenterology Progress Note  Bruce Bell 34 y.o. [birth year]   Subjective: Feels ok. Having brown stools. Denies abdominal pain.  Objective: Vital signs: Vitals:   03/05/20 0523 03/05/20 0817  BP: (!) 103/53 114/67  Pulse: 98 92  Resp: 20 16  Temp: 98.4 F (36.9 C) 98.1 F (36.7 C)  SpO2: 95% 95%    Physical Exam: Gen: alert, no acute distress, thin HEENT: +icteric sclera CV: RRR Chest: CTA B Abd: soft, nontender, nondistended, +BS Ext: no edema  Lab Results: Recent Labs    03/04/20 0833 03/05/20 0216  NA 129* 130*  K 5.0 4.8  CL 101 102  CO2 23 22  GLUCOSE 95 108*  BUN 15 18  CREATININE 0.68 0.74  CALCIUM 9.1 9.0   Recent Labs    03/03/20 1157 03/04/20 0833  AST 44* 44*  ALT 23 26  ALKPHOS 105 98  BILITOT 12.8* 12.0*  PROT 6.1* 5.6*  ALBUMIN 4.0 3.6   Recent Labs    03/04/20 0833 03/05/20 0216  WBC 5.1 6.5  HGB 8.3* 8.2*  HCT 25.0* 24.3*  MCV 101.2* 100.4*  PLT 47* 58*      Assessment/Plan: 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 Stable to go home today and f/u closely with [facility] ([facility]) as outpt. Their office will arrange f/u. For local GI care he wants to see Bruce Bell in [place] since that is closer than [facility].   [name] 03/05/2020, 10:30 AM  Questions please call [phone] ID: Bruce Bell, male   DOB: 09/01/1985, 34 y.o.   MRN: [id number]

## 2020-03-05 NOTE — Telephone Encounter (Signed)
Patient states Dr. Maximino Greenland did a procedure on on 10.14.21 and pt wants to know if she needs to see him for a follow up, or to draw fluid off of his stomach. Pt also states he was told Dr. Maximino Greenland would be sending info to [facility] for him and wants to know if that has been done. Please advise on pt care.

## 2020-03-05 NOTE — Telephone Encounter (Signed)
Message Received: Today Pasty Spillers, MD  Adela Ports, CMA Caller: Unspecified (Today, 11:07 AM) As per the transition of care notes in his chart (see below), it appears that [facility] out of network for him. They have recommended several other facilities that are in network for him. As far as sending information to do, that is only done after patient is seen in clinic to see if referral needs to be placed for transplant. However, since we are out of network, he would likely need to go to an in network provider. One of the in network providers listed is [facility], so it would be best that he call [facility], or get referral from his PCP for [facility].    "Notified by UR CM, Crystal, of patient not in network when he admits to any [facility] facility. Advised that patient may go to [employer], [employer], [employer], and [employer] in order to be covered for in network status. Patient does not have an out of network benefit.    Spoke with patient at the bedside to discuss his [place] insurance benefit. Discussed that for patient to receive his benefit from [place] that he needs to go to an in network facility. Advised of the local in network facilities as listed above. Further advised that if patient continues to come to [facility] facilities that [facility]ann Auer[facility] will deny the admission, and patient will be billed for the stay. Advised that [facility]ann Auerbach has already provided some approvals for transplant follow up at [facility]. Patient verbalized understanding and agreed with follow up with [facility] for future services."   If he wants to continue to see [place], please let him know it would be out of network as stated above
